# Patient Record
Sex: Female | Born: 1950 | Race: White | Hispanic: No | Marital: Married | Smoking: Never smoker
Health system: Southern US, Community
[De-identification: ages and names within clinical notes are randomized; demographics above are authoritative.]

## PROBLEM LIST (undated history)

## (undated) DIAGNOSIS — N183 Chronic kidney disease, stage 3 (moderate): Secondary | ICD-10-CM

## (undated) DIAGNOSIS — IMO0001 Reserved for inherently not codable concepts without codable children: Secondary | ICD-10-CM

## (undated) DIAGNOSIS — E669 Obesity, unspecified: Secondary | ICD-10-CM

## (undated) DIAGNOSIS — M1711 Unilateral primary osteoarthritis, right knee: Secondary | ICD-10-CM

## (undated) DIAGNOSIS — K219 Gastro-esophageal reflux disease without esophagitis: Secondary | ICD-10-CM

## (undated) DIAGNOSIS — K859 Acute pancreatitis without necrosis or infection, unspecified: Secondary | ICD-10-CM

## (undated) DIAGNOSIS — Z8719 Personal history of other diseases of the digestive system: Secondary | ICD-10-CM

## (undated) DIAGNOSIS — N189 Chronic kidney disease, unspecified: Secondary | ICD-10-CM

## (undated) DIAGNOSIS — E079 Disorder of thyroid, unspecified: Secondary | ICD-10-CM

## (undated) DIAGNOSIS — K635 Polyp of colon: Secondary | ICD-10-CM

## (undated) DIAGNOSIS — M1712 Unilateral primary osteoarthritis, left knee: Secondary | ICD-10-CM

## (undated) DIAGNOSIS — E785 Hyperlipidemia, unspecified: Secondary | ICD-10-CM

## (undated) DIAGNOSIS — I10 Essential (primary) hypertension: Secondary | ICD-10-CM

## (undated) HISTORY — PX: COLONOSCOPY: SHX174

## (undated) HISTORY — DX: Hyperlipidemia, unspecified: E78.5

## (undated) HISTORY — DX: Polyp of colon: K63.5

## (undated) HISTORY — DX: Disorder of thyroid, unspecified: E07.9

## (undated) HISTORY — DX: Gastro-esophageal reflux disease without esophagitis: K21.9

## (undated) HISTORY — DX: Essential (primary) hypertension: I10

## (undated) HISTORY — DX: Obesity, unspecified: E66.9

## (undated) HISTORY — PX: KNEE ARTHROSCOPY W/ MENISCAL REPAIR: SHX1877

## (undated) HISTORY — PX: FOOT SURGERY: SHX648

## (undated) HISTORY — PX: OTHER SURGICAL HISTORY: SHX169

## (undated) HISTORY — DX: Reserved for inherently not codable concepts without codable children: IMO0001

## (undated) HISTORY — PX: LAPAROSCOPIC ENDOMETRIOSIS FULGURATION: SUR769

## (undated) HISTORY — DX: Acute pancreatitis without necrosis or infection, unspecified: K85.90

## (undated) HISTORY — PX: DIAGNOSTIC LAPAROSCOPY: SUR761

## (undated) HISTORY — PX: JOINT REPLACEMENT: SHX530

---

## 2001-06-06 ENCOUNTER — Encounter: Payer: Self-pay | Admitting: Obstetrics and Gynecology

## 2001-06-06 ENCOUNTER — Ambulatory Visit (HOSPITAL_COMMUNITY): Admission: RE | Admit: 2001-06-06 | Discharge: 2001-06-06 | Payer: Self-pay | Admitting: Obstetrics and Gynecology

## 2001-12-20 ENCOUNTER — Encounter: Payer: Self-pay | Admitting: Obstetrics and Gynecology

## 2001-12-20 ENCOUNTER — Ambulatory Visit (HOSPITAL_COMMUNITY): Admission: RE | Admit: 2001-12-20 | Discharge: 2001-12-20 | Payer: Self-pay | Admitting: Obstetrics and Gynecology

## 2002-01-15 ENCOUNTER — Encounter (HOSPITAL_COMMUNITY): Admission: RE | Admit: 2002-01-15 | Discharge: 2002-02-14 | Payer: Self-pay | Admitting: Orthopedic Surgery

## 2002-06-10 ENCOUNTER — Ambulatory Visit (HOSPITAL_COMMUNITY): Admission: RE | Admit: 2002-06-10 | Discharge: 2002-06-10 | Payer: Self-pay | Admitting: Pulmonary Disease

## 2002-07-11 ENCOUNTER — Emergency Department (HOSPITAL_COMMUNITY): Admission: EM | Admit: 2002-07-11 | Discharge: 2002-07-11 | Payer: Self-pay | Admitting: *Deleted

## 2003-06-12 ENCOUNTER — Ambulatory Visit (HOSPITAL_COMMUNITY): Admission: RE | Admit: 2003-06-12 | Discharge: 2003-06-12 | Payer: Self-pay | Admitting: Pulmonary Disease

## 2003-10-22 ENCOUNTER — Ambulatory Visit (HOSPITAL_COMMUNITY): Admission: RE | Admit: 2003-10-22 | Discharge: 2003-10-22 | Payer: Self-pay | Admitting: Podiatry

## 2004-03-04 ENCOUNTER — Ambulatory Visit (HOSPITAL_COMMUNITY): Admission: RE | Admit: 2004-03-04 | Discharge: 2004-03-04 | Payer: Self-pay | Admitting: Podiatry

## 2004-06-16 ENCOUNTER — Ambulatory Visit (HOSPITAL_COMMUNITY): Admission: RE | Admit: 2004-06-16 | Discharge: 2004-06-16 | Payer: Self-pay | Admitting: Pulmonary Disease

## 2005-06-27 ENCOUNTER — Ambulatory Visit (HOSPITAL_COMMUNITY): Admission: RE | Admit: 2005-06-27 | Discharge: 2005-06-27 | Payer: Self-pay | Admitting: Pulmonary Disease

## 2005-12-16 ENCOUNTER — Ambulatory Visit: Payer: Self-pay | Admitting: Internal Medicine

## 2005-12-16 ENCOUNTER — Ambulatory Visit (HOSPITAL_COMMUNITY): Admission: RE | Admit: 2005-12-16 | Discharge: 2005-12-16 | Payer: Self-pay | Admitting: Internal Medicine

## 2005-12-16 ENCOUNTER — Encounter (INDEPENDENT_AMBULATORY_CARE_PROVIDER_SITE_OTHER): Payer: Self-pay | Admitting: Specialist

## 2006-07-24 ENCOUNTER — Ambulatory Visit (HOSPITAL_COMMUNITY): Admission: RE | Admit: 2006-07-24 | Discharge: 2006-07-24 | Payer: Self-pay | Admitting: Pulmonary Disease

## 2007-08-01 ENCOUNTER — Ambulatory Visit (HOSPITAL_COMMUNITY): Admission: RE | Admit: 2007-08-01 | Discharge: 2007-08-01 | Payer: Self-pay | Admitting: Obstetrics and Gynecology

## 2008-08-01 ENCOUNTER — Ambulatory Visit (HOSPITAL_COMMUNITY): Admission: RE | Admit: 2008-08-01 | Discharge: 2008-08-01 | Payer: Self-pay | Admitting: Pulmonary Disease

## 2009-08-06 ENCOUNTER — Ambulatory Visit (HOSPITAL_COMMUNITY): Admission: RE | Admit: 2009-08-06 | Discharge: 2009-08-06 | Payer: Self-pay | Admitting: Pulmonary Disease

## 2010-08-13 ENCOUNTER — Other Ambulatory Visit (HOSPITAL_COMMUNITY): Payer: Self-pay | Admitting: Pulmonary Disease

## 2010-08-13 DIAGNOSIS — Z139 Encounter for screening, unspecified: Secondary | ICD-10-CM

## 2010-08-24 ENCOUNTER — Ambulatory Visit (HOSPITAL_COMMUNITY)
Admission: RE | Admit: 2010-08-24 | Discharge: 2010-08-24 | Disposition: A | Discharge status: Home / Self Care | Payer: BC Managed Care – PPO | Source: Ambulatory Visit | Attending: Pulmonary Disease | Admitting: Pulmonary Disease

## 2010-08-24 DIAGNOSIS — Z1231 Encounter for screening mammogram for malignant neoplasm of breast: Secondary | ICD-10-CM | POA: Insufficient documentation

## 2010-08-24 DIAGNOSIS — Z139 Encounter for screening, unspecified: Secondary | ICD-10-CM

## 2010-09-17 NOTE — Op Note (Signed)
NAMEMARKAYLA, REICHART                 ACCOUNT NO.:  [id number]   MEDICAL RECORD NO.:  [id number]          PATIENT TYPE:  AMB   LOCATION:  DAY                           FACILITY:  [facility]   PHYSICIAN:  Lionel December, M.D.    DATE OF BIRTH:  1951/03/12   DATE OF PROCEDURE:  12/16/2005  DATE OF DISCHARGE:                                 OPERATIVE REPORT   PROCEDURE:  Colonoscopy.   INDICATIONS:  Fumi is a 60 year old Caucasian female who is undergoing  average risk screening colonoscopy.  The procedure was reviewed with the  patient, informed consent was obtained.   MEDS FOR CONSCIOUS SEDATION:  Demerol 50 mg IV, Versed 7 mg IV.   FINDINGS:  The procedure performed in endoscopy suite.  The patient's vital  signs and O2 sat were monitored during the procedure and remained stable.  The patient was placed in the left lateral position.  Rectal examination  performed.  No abnormality noted on external or digital exam.  The Olympus  videoscope was placed in the rectum and advanced under direct vision in the  sigmoid colon and beyond.  Preparation was excellent.  A few tiny  diverticula were noted at the sigmoid colon.  There was a 5 mm polyp at the  mid transverse colon which was ablated via cold biopsy.  Pictures taken for  the record.  The scope was passed to the cecum which was identified by the  appendiceal orifice and ileocecal valve.  Once again pictures taken for the  record.  The colonic mucosa was examined for the second time on the way out  and was normal otherwise.  Rectal mucosa similarly was normal.  The scope  was retroflexed to examine anorectal junction and small hemorrhoids were  noted below the dentate line.  The endoscope was straightened and withdrawn.  The patient tolerated the procedure well.   FINAL DIAGNOSIS:  1. 5 mm polyp ablated via cold biopsy from mid transverse colon.  2. A few tiny diverticula at sigmoid colon.  3. Small external hemorrhoids.    RECOMMENDATIONS:  1. High-fiber diet.  2. I will be contacting the patient with biopsy results and further      recommendations.      Lionel December, M.D.  Electronically Signed     NR/MEDQ  D:  12/16/2005  T:  12/16/2005  Job:  [id number]   cc:   S. Kyra Manges, M.D.  Fax: [phone]   Oneal Deputy. Juanetta Gosling, M.D.  Fax: [phone]

## 2010-09-17 NOTE — H&P (Signed)
Deanna [name], PFARR                 ACCOUNT NO.:  [id number]   MEDICAL RECORD NO.:  [id number]           PATIENT TYPE:   LOCATION:                                 FACILITY:   PHYSICIAN:  Oley Balm. Pricilla Holm, D.P.M.     DATE OF BIRTH:   DATE OF ADMISSION:  03/04/2004  DATE OF DISCHARGE:  LH                                HISTORY & PHYSICAL   HISTORY OF PRESENT ILLNESS:  Ms. Deanna [name] is a 60 year old female that  previously underwent bunion surgery and hammertoe surgery, and subsequently  developed and under-lapping third toe of her left foot.  She relates she  feels like she walks on the third toe. She requests surgical correction of  same.   PREVIOUS HOSPITALIZATION AND SURGERY:  1.  Childbirth.  2.  Endometriosis.  3.  Bilateral bunion surgery.   MEDICATIONS:  Lotrel, Nexium, calcium, vitamin C.   ALLERGIES:  SULFA DRUGS.   PAST MEDICAL HISTORY:  No transfusions or hepatitis.   REVIEW OF SYSTEMS:  History of arthritis, stomach ulcers, hypertension.   PHYSICAL EXAMINATION:  EXTREMITIES:  Palpable pedal pulses both DP and PT  with spontaneous capillary filling time.  NEUROLOGIC:  Exam is essentially within normal limits.  MUSCULOSKELETAL:  Exam reveals the patient has an under-lapping third toe of  the left foot.  Abductor varus rotation.   ASSESSMENT:  Under-lapping third toe left foot.   PLAN:  The patient is to undergo arthroplasty and tenotomy, third toe, left  foot.  Reviewed the procedure, including complications of the procedure such  as infection, wound infection, postoperative pain, swelling, etc.  The  patient seems to understand the same, and surgery has been scheduled for  March 04, 2004.       ___________________________________________  Oley Balm. Pricilla Holm, D.P.M.    DBT/MEDQ  D:  03/03/2004  T:  03/03/2004  Job:  [id number]   cc:   [facility]  Fax: [phone]

## 2010-09-17 NOTE — Op Note (Signed)
[name], [name]                 ACCOUNT NO.:  [id number]   MEDICAL RECORD NO.:  [id number]          PATIENT TYPE:  AMB   LOCATION:  DAY                           FACILITY:  [facility]   PHYSICIAN:  Oley Balm. Pricilla Holm, D.P.M.DATE OF BIRTH:  July 28, 1950   DATE OF PROCEDURE:  03/04/2004  DATE OF DISCHARGE:                                 OPERATIVE REPORT   TYPE OF ANESTHESIA:  Monitored anesthesia care.   PREOPERATIVE DIAGNOSIS:  Abductor varus rotation, third toe left foot, and  dorsal contraction, second left foot.   POSTOPERATIVE DIAGNOSIS:  Abductor varus rotation, third toe left foot, and  dorsal contraction, second left foot.   PROCEDURE:  Tenotomy, arthroplasty, third toe left foot, and tenotomy,  second toe left foot.   INDICATIONS FOR PROCEDURE:  Long-standing history of pain from deformed  third and second toes.   The patient brought to the operating room and placed on the operating table  in supine position. The patient's lower left foot and leg were then prepped  and draped in the usual aseptic manner. Then, with ankle tourniquet placed,  well padded to prevent contusion, and elevated to 250 mmHg after  exsanguination of the left foot, the follow surgical procedures were then  performed under monitored anesthesia care:  1.  TENOTOMY, ARTHROPLASTY, THIRD TOE LEFT FOOT. Attention was turned to the      plantar aspect of the third toe where linear incision made. Incision was      widened and deepened with very sharp blunt dissection, making sure to      identify and retract all vital structures. Capsule incision was made,      and the flexor tendon was isolated and identified and tenotomized      utilizing a #64 blade. After tenotomy, the plantar aspect of the joint      was then invaded. The plantar condyle resected and the wound lavaged      with copious amounts of sterile saline and the cuff and subcutaneous      tissue required 10 sutures of 4-0 Dexon, skin was approximated  utilizing      horizontal mattress suture of 4-0 Prolene.  2.  TENOTOMY, SECOND TOE LEFT FOOT. Attention was directed to the dorsal      aspect of the second digit where a stab incision made and incision was      widened, and then the extensor tendon was isolated and identified and      then tenotomized. The wound was lavaged with copious amounts of sterile      saline, and the skin incision was closed utilizing 1 horizontal mattress      suture of 4-0 Prolene.   All surgical sites were then infiltrated with approximately 18 mL of  dexamethasone phosphate and mild compressive bandage consisting of Betadine  soaked Adaptic, sterile 4 x 4's, and Kling were then applied. The patient  tolerated the procedure well and left the operating room in apparent good  condition. Vital signs were stable in the recovery room.   ADDENDUM:  There should be an addendum  on Deanna [name]'s history and physical.  It should be noted that she had a contracted second toe, left foot, with  diagnosis of hammer toe second digit, left foot. Proposed surgery tenotomy  second toe, left foot.      DBT/MEDQ  D:  03/04/2004  T:  03/04/2004  Job:  [id number]

## 2010-09-17 NOTE — H&P (Signed)
NAME:  Deanna [name], Deanna [name]                           ACCOUNT NO.:  [id number]   MEDICAL RECORD NO.:  [id number]                   PATIENT TYPE:  AMB   LOCATION:  DAY                                  FACILITY:  [facility]   PHYSICIAN:  Oley Balm. Pricilla Holm, D.P.M.             DATE OF BIRTH:  08-Mar-1951   DATE OF ADMISSION:  10/22/2003  DATE OF DISCHARGE:                                HISTORY & PHYSICAL   Ms. Pasquarella is a 60 year old white female who presented to the office with  chief complaint of painful bunion deformity of her left foot.  The patient  has last year undergone correction of her right foot and has now elected to  have her left foot corrected. She relates pain with shoe gear and  ambulation, inability to wear closed shoes from same.  She also relates  difficulty because of contracted second and third toes.  The patient  requests surgical correction of her digital deformities.   PREVIOUS HOSPITALIZATIONS AND SURGERIES:  1. Childbirth.  2. Endometriosis.   MEDICATIONS:  Lotrel, Nexium, calcium, vitamin C.   ALLERGIES:  She relates allergies to SULFA DRUGS.   No transfusion or hepatitis.   REVIEW OF SYSTEMS:  She reveals history of arthritis, stomach ulcers,  hypertension.   PHYSICAL EXAMINATION:  EXTREMITIES:  Lower extremity exam reveals palpable  pedal pulses, but DP and PT revealed spontaneous capillary refilling time.  NEUROLOGIC:  Essentially within normal limits.  MUSCULOSKELETAL:  Reveals hypertrophy of the medial eminence of the first  metatarsal head of the left foot with lateral deviation of the hallux  consistent with hallux valgus deformity.  There is increased intermetatarsal  angle additionally noted radiographically.  There was also noted dorsally  contracted second toe consistent with a hammertoe deformity.  Also, the  patient has a dorsally contracted second toe with dorsal dislocation at the  proximal interphalangeal joint.   LABORATORY AND X-RAY DATA:  X-rays  taken confirm clinical diagnosis with  dorsally contracted second toe and hallux valgus deformity.   IMPRESSION:  Hallux valgus deformity with dorsally contracted second toe.   PLAN:  I reviewed procedures which would consist of an osteal bunionectomy,  arthroplasty IP fusion second toe.  Reviewed the procedure with the patient  including complications of procedure such as infection, bone infection,  postoperative pain and swelling, etc.  The patient seems to understand the  same, and surgery has been scheduled for October 22, 2003.     ___________________________________________                                         Oley Balm. Pricilla Holm, D.P.M.   DBT/MEDQ  D:  10/21/2003  T:  10/21/2003  Job:  [id number]

## 2010-09-17 NOTE — Op Note (Signed)
NAME:  Deanna [name], Deanna [name]                           ACCOUNT NO.:  [id number]   MEDICAL RECORD NO.:  [id number]                   PATIENT TYPE:  AMB   LOCATION:  DAY                                  FACILITY:  [facility]   PHYSICIAN:  Oley Balm. Pricilla Holm, D.P.M.             DATE OF BIRTH:  10-Jun-1950   DATE OF PROCEDURE:  10/22/2003  DATE OF DISCHARGE:                                 OPERATIVE REPORT   SURGEON OF RECORD:  Oley Balm. Pricilla Holm, D.P.M.   TYPE OF ANESTHESIA:  Monitored anesthesia care with local infiltrate of 2%  Xylocaine and 0.5% Marcaine.   PREOPERATIVE DIAGNOSES:  1. Hallux valgus deformity, left foot.  2. Hammertoe deformity, second digit left foot.   POSTOPERATIVE DIAGNOSES:  1. Hallux valgus deformity, left foot.  2. Hammertoe deformity, second digit left foot.   PROCEDURE:  Austin bunionectomy in left foot and arthroplasty at IP fusion,  second toe left foot.   INDICATIONS FOR SURGERY:  Longstanding history of pain, unrelieved by  conservative care.   DESCRIPTION OF PROCEDURE:  The patient brought in the operating room and  placed on the operating table in the supine position.  The patient's lower  foot and leg was then prepped and draped in the usual aseptic manner.  Then  with an ankle tourniquet placed and well-padded to prevent contusion and  elevated 250 mmHg, after exsanguination of the left foot, the following  surgical procedures were then performed under monitored anesthesia care.   AUSTIN BUNIONECTOMY, LEFT FOOT:  Attention was directed to the dorsomedial  aspect of the left foot at the level of the first MTP where a curvilinear  incision was made.  Incision was widened and deepened via sharp and blunt  dissection being sure to identify and retract all vital structures.  A  capsular incision was then made in the head of the metatarsal, freeing all  soft tissue attachments dorsally and medially.  Then utilizing a Zimmer  oscillating saw, the medial limbus on the  medial aspect of the first  metatarsal head was resected.  Dissection was then carried down deep in the  first web space where a lateral fibula sesamoid release was performed.  Attention was redirected to the medial aspect of the first metatarsal head  where using a guidewire, an Austin-type osteotomy was made over the apex  distal and the base proximal.  The capital fragments were slid laterally,  impacted, and fixated with two 0.045 K-wires.  After fixation, it was noted  that the osteotomy site was stable.  The remaining protruding aspect of the  metatarsal was resected.  All rough edges were rasped smooth, the wound  lavaged with copious amounts of sterile saline, and the subcutaneous tissue  approximated with subcuticular suture of 4-0 Dexon, and the skin was  approximated utilizing running subcuticular suture of 4-0 Dexon.   ARTHROPLASTY SECOND TOE LEFT FOOT WITH K-WIRE STABILIZATION:  Attention was  directed to the dorsal aspect of the second toe where two semi-elliptical  converting transverse skin incisions were made, the incision widened and  deepened via sharp and blunt dissection, being sure to remove the  intervening flap of skin.  A transverse capsular incision was made and the  head of the phalanx freed from its soft tissue attachments dorsally,  medially, laterally, and plantarly, and then the head of the phalanx  resected utilizing a Zimmer oscillating saw.  At the end, a K-wire was  retrograded through the middle phalanx to the proximal phalanx to stabilize  the toe.  Once the stabilization was obtained, the tendon was reapproximated  utilizing 1 horizontal mattress suture of 4-0 Dexon.  The skin was  approximated utilizing horizontal mattress sutures of 4-0 Prolene.   All surgical sites were then infiltrated with approximately 18 mL of  dexamethasone phosphatase and mild compressive bandages consisting of  Betadine-soaked Adaptic.  Sterile 4 x 4's, and sterile Kling  were then  applied.  The patient tolerated the procedure well and left the operating  room in apparent good condition with vital signs stable to recovery room.      ___________________________________________                                            Oley Balm Pricilla Holm, D.P.M.   DBT/MEDQ  D:  10/22/2003  T:  10/22/2003  Job:  [id number]

## 2010-10-30 ENCOUNTER — Emergency Department (HOSPITAL_COMMUNITY)
Admission: EM | Admit: 2010-10-30 | Discharge: 2010-10-30 | Disposition: A | Discharge status: Home / Self Care | Payer: BC Managed Care – PPO | Attending: Emergency Medicine | Admitting: Emergency Medicine

## 2010-10-30 ENCOUNTER — Emergency Department (HOSPITAL_COMMUNITY): Payer: BC Managed Care – PPO

## 2010-10-30 DIAGNOSIS — R112 Nausea with vomiting, unspecified: Secondary | ICD-10-CM | POA: Insufficient documentation

## 2010-10-30 DIAGNOSIS — K219 Gastro-esophageal reflux disease without esophagitis: Secondary | ICD-10-CM | POA: Insufficient documentation

## 2010-10-30 DIAGNOSIS — I10 Essential (primary) hypertension: Secondary | ICD-10-CM | POA: Insufficient documentation

## 2010-10-30 DIAGNOSIS — K859 Acute pancreatitis without necrosis or infection, unspecified: Secondary | ICD-10-CM | POA: Insufficient documentation

## 2010-10-30 DIAGNOSIS — R1084 Generalized abdominal pain: Secondary | ICD-10-CM | POA: Insufficient documentation

## 2010-10-30 DIAGNOSIS — Z79899 Other long term (current) drug therapy: Secondary | ICD-10-CM | POA: Insufficient documentation

## 2010-10-30 DIAGNOSIS — K5900 Constipation, unspecified: Secondary | ICD-10-CM | POA: Insufficient documentation

## 2010-10-30 LAB — DIFFERENTIAL
Basophils Absolute: 0 10*3/uL (ref 0.0–0.1)
Eosinophils Absolute: 0.6 10*3/uL (ref 0.0–0.7)
Eosinophils Relative: 4 % (ref 0–5)
Lymphs Abs: 1.6 10*3/uL (ref 0.7–4.0)
Monocytes Absolute: 1.2 10*3/uL — ABNORMAL HIGH (ref 0.1–1.0)
Monocytes Relative: 8 % (ref 3–12)
Neutro Abs: 12.4 10*3/uL — ABNORMAL HIGH (ref 1.7–7.7)

## 2010-10-30 LAB — COMPREHENSIVE METABOLIC PANEL
ALT: 26 U/L (ref 0–35)
Alkaline Phosphatase: 116 U/L (ref 39–117)
Calcium: 10.2 mg/dL (ref 8.4–10.5)
GFR calc non Af Amer: 54 mL/min — ABNORMAL LOW (ref >60)
Glucose, Bld: 148 mg/dL — ABNORMAL HIGH (ref 70–99)
Sodium: 135 mEq/L (ref 135–145)

## 2010-10-30 LAB — URINALYSIS, ROUTINE W REFLEX MICROSCOPIC
Glucose, UA: NEGATIVE mg/dL
Leukocytes, UA: NEGATIVE
Protein, ur: NEGATIVE mg/dL
Urobilinogen, UA: 0.2 mg/dL (ref 0.0–1.0)

## 2010-10-30 LAB — CBC
HCT: 37.5 % (ref 36.0–46.0)
MCH: 30.9 pg (ref 26.0–34.0)

## 2010-10-30 MED ORDER — IOHEXOL 300 MG/ML  SOLN
100.0000 mL | Freq: Once | INTRAMUSCULAR | Status: AC | PRN
  Administered 2010-10-30: 100 mL via INTRAVENOUS

## 2010-11-02 ENCOUNTER — Other Ambulatory Visit (HOSPITAL_COMMUNITY): Payer: Self-pay | Admitting: Pulmonary Disease

## 2010-11-02 DIAGNOSIS — K859 Acute pancreatitis without necrosis or infection, unspecified: Secondary | ICD-10-CM

## 2010-11-08 ENCOUNTER — Other Ambulatory Visit (HOSPITAL_COMMUNITY): Payer: Self-pay | Admitting: Pulmonary Disease

## 2010-11-08 ENCOUNTER — Ambulatory Visit (HOSPITAL_COMMUNITY)
Admission: RE | Admit: 2010-11-08 | Discharge: 2010-11-08 | Disposition: A | Discharge status: Home / Self Care | Payer: BC Managed Care – PPO | Source: Ambulatory Visit | Attending: Pulmonary Disease | Admitting: Pulmonary Disease

## 2010-11-08 DIAGNOSIS — Z09 Encounter for follow-up examination after completed treatment for conditions other than malignant neoplasm: Secondary | ICD-10-CM | POA: Insufficient documentation

## 2010-11-08 DIAGNOSIS — K859 Acute pancreatitis without necrosis or infection, unspecified: Secondary | ICD-10-CM

## 2010-11-08 DIAGNOSIS — K861 Other chronic pancreatitis: Secondary | ICD-10-CM | POA: Insufficient documentation

## 2010-11-08 DIAGNOSIS — R109 Unspecified abdominal pain: Secondary | ICD-10-CM | POA: Insufficient documentation

## 2010-11-08 DIAGNOSIS — R110 Nausea: Secondary | ICD-10-CM | POA: Insufficient documentation

## 2010-11-08 MED ORDER — IOHEXOL 300 MG/ML  SOLN
100.0000 mL | Freq: Once | INTRAMUSCULAR | Status: AC | PRN
  Administered 2010-11-08: 100 mL via INTRAVENOUS

## 2010-11-10 ENCOUNTER — Ambulatory Visit (INDEPENDENT_AMBULATORY_CARE_PROVIDER_SITE_OTHER): Payer: BC Managed Care – PPO | Admitting: Internal Medicine

## 2010-11-10 DIAGNOSIS — K859 Acute pancreatitis without necrosis or infection, unspecified: Secondary | ICD-10-CM

## 2010-12-07 ENCOUNTER — Other Ambulatory Visit (INDEPENDENT_AMBULATORY_CARE_PROVIDER_SITE_OTHER): Payer: Self-pay | Admitting: Internal Medicine

## 2010-12-07 DIAGNOSIS — K859 Acute pancreatitis without necrosis or infection, unspecified: Secondary | ICD-10-CM

## 2010-12-10 ENCOUNTER — Ambulatory Visit (HOSPITAL_COMMUNITY)
Admission: RE | Admit: 2010-12-10 | Discharge: 2010-12-10 | Disposition: A | Discharge status: Home / Self Care | Payer: BC Managed Care – PPO | Source: Ambulatory Visit | Attending: Internal Medicine | Admitting: Internal Medicine

## 2010-12-10 DIAGNOSIS — K859 Acute pancreatitis without necrosis or infection, unspecified: Secondary | ICD-10-CM | POA: Insufficient documentation

## 2010-12-10 DIAGNOSIS — K769 Liver disease, unspecified: Secondary | ICD-10-CM | POA: Insufficient documentation

## 2010-12-10 DIAGNOSIS — K5730 Diverticulosis of large intestine without perforation or abscess without bleeding: Secondary | ICD-10-CM | POA: Insufficient documentation

## 2010-12-10 DIAGNOSIS — Z09 Encounter for follow-up examination after completed treatment for conditions other than malignant neoplasm: Secondary | ICD-10-CM | POA: Insufficient documentation

## 2010-12-10 DIAGNOSIS — K449 Diaphragmatic hernia without obstruction or gangrene: Secondary | ICD-10-CM | POA: Insufficient documentation

## 2010-12-10 MED ORDER — IOHEXOL 300 MG/ML  SOLN
100.0000 mL | Freq: Once | INTRAMUSCULAR | Status: AC | PRN
  Administered 2010-12-10: 100 mL via INTRAVENOUS

## 2010-12-14 NOTE — Progress Notes (Signed)
SET RECALL 6 MTHS KR

## 2011-06-01 ENCOUNTER — Encounter (INDEPENDENT_AMBULATORY_CARE_PROVIDER_SITE_OTHER): Payer: Self-pay | Admitting: *Deleted

## 2011-06-14 ENCOUNTER — Ambulatory Visit (INDEPENDENT_AMBULATORY_CARE_PROVIDER_SITE_OTHER): Payer: BC Managed Care – PPO | Admitting: Internal Medicine

## 2011-09-06 ENCOUNTER — Other Ambulatory Visit (HOSPITAL_COMMUNITY): Payer: Self-pay | Admitting: Pulmonary Disease

## 2011-09-06 DIAGNOSIS — Z139 Encounter for screening, unspecified: Secondary | ICD-10-CM

## 2011-09-13 ENCOUNTER — Ambulatory Visit (HOSPITAL_COMMUNITY): Payer: BC Managed Care – PPO

## 2011-09-15 ENCOUNTER — Ambulatory Visit (HOSPITAL_COMMUNITY)
Admission: RE | Admit: 2011-09-15 | Discharge: 2011-09-15 | Disposition: A | Discharge status: Home / Self Care | Payer: BC Managed Care – PPO | Source: Ambulatory Visit | Attending: Pulmonary Disease | Admitting: Pulmonary Disease

## 2011-09-15 DIAGNOSIS — Z1231 Encounter for screening mammogram for malignant neoplasm of breast: Secondary | ICD-10-CM | POA: Insufficient documentation

## 2011-09-15 DIAGNOSIS — Z139 Encounter for screening, unspecified: Secondary | ICD-10-CM

## 2012-02-01 ENCOUNTER — Encounter: Payer: Self-pay | Admitting: Orthopedic Surgery

## 2012-02-01 ENCOUNTER — Ambulatory Visit (INDEPENDENT_AMBULATORY_CARE_PROVIDER_SITE_OTHER): Payer: BC Managed Care – PPO | Admitting: Orthopedic Surgery

## 2012-02-01 VITALS — BP 160/80 | Ht 67.0 in | Wt 265.0 lb

## 2012-02-01 DIAGNOSIS — M6530 Trigger finger, unspecified finger: Secondary | ICD-10-CM | POA: Insufficient documentation

## 2012-02-01 NOTE — Patient Instructions (Addendum)
You have received a steroid shot. 15% of patients experience increased pain at the injection site with in the next 24 hours. This is best treated with ice and tylenol extra strength 2 tabs every 8 hours. If you are still having pain please call the office.   Trigger Finger Trigger finger (digital tendinitis and stenosing tenosynovitis) is a common disorder that causes an often painful catching of the fingers or thumb. It occurs as a clicking, snapping or locking of a finger in the palm of the hand. The reason for this is that there is a problem with the tendons which flex the fingers sliding smoothly through their sheaths. The cause of this may be inflammation of the tendon and sheath, or from a thickening or nodule in the tendon. The condition may occur in any finger or a couple fingers at the same time. The cause may be overuse while doing the same activity over and over again with your hands.   Tendons are the tough cords that connect the muscles to bones. Muscles and tendons are part of the system which allows your body to move. When muscles contract in the forearm on the palm side, they pull the tendons toward the elbow and cause the fingers and thumb to bend (flex) toward the palm. These are the flexor tendons. The tendons slide through a slippery smooth membrane (synovium) which is called the tendon sheath. The sheaths have areas of tough fibrous tissues surrounding them which hold the tendons close to the bone. These are called pulleys because they work like a pulley. The first pulley is in the palm of the hand near the crease which runs across your palm. If the area of the tendon thickening is near the pulley, the tendon cannot slide smoothly through the pulley and this causes the trigger finger. The finger may lock with the finger curled or suddenly straighten out with a snap. This is more common in patients with rheumatoid arthritis and diabetes. Left untreated, the condition may get worse to the  point where the finger becomes locked in flexion, like making a fist, or less commonly locked with the finger straightened out. DIAGNOSIS   Your caregiver will easily make this diagnosis on examination. TREATMENT    Splinting for 6 to 8 weeks of time may be helpful. Use the splints as your caregiver suggests.   Heat used for twenty minutes at least four times a day followed by ice packs for twenty minutes unless directed otherwise by your caregiver may be helpful. If you find either heat or cold seems to be making the problem worse, quit using them and ask your caregiver for directions.   Cortisone injections along with splinting may speed up recovery. Several injections may be required. Cortisone may give relief after one injection.   Only take over-the-counter or prescription medicines for pain, discomfort, or fever as directed by your caregiver.   Surgery is another treatment that may be used if conservative treatments using injection and splinting does not work. Surgery can be minor without incisions (a cut does not have to be made) and can be done with a needle through the skin. No stitches are needed and most patients may return to work the same day.   Other surgical choices involve an open procedure where the surgeon opens the hand through a small incision (cut) and cuts the pulley so the tendon can again slide smoothly. Your hand will still work fine. This small operation requires stitches and the recovery will   be a little longer and the incisions will need to be protected until completely healed. You may have to limit your activities for up to 6 months.   Occupational or hand therapy may be required if there is stiffness remaining in the finger.  RISKS AND COMPLICATIONS Complications are uncommon but some problems that may occur are:  Recurrence of the trigger finger. This does not mean that the surgery was not well done. It simply means that you may have formed scar tissue following  surgery that causes the problem to reoccur.   Infection which could ruin the results of the surgery and can result in a finger which is frozen and can not move normally.   Nerve injury is possible which could result in permanent numbness of one or more fingers.  CARE AFTER SURGERY  Elevate your hand above your heart and use ice as instructed.   Follow instructions regarding finger motion/exercise.   Keep the surgical wound dry for at least 48 hrs or longer if instructed.   Keep your follow-up appointments.   Return to work and normal activities as instructed.  SEEK IMMEDIATE MEDICAL CARE IF:   Your problems are getting worse or you do not obtain relief from the treatment. Document Released: 02/06/2004 Document Revised: 07/11/2011 Document Reviewed: 09/30/2008 [employer] Patient Information 2013 [employer].    

## 2012-02-01 NOTE — Progress Notes (Signed)
Patient ID: Deanna [name], female   DOB: [birth year], 61 y.o.   MRN: [id number] Chief Complaint  Patient presents with  . Hand Problem    trigger finger, right thumb, sudden onset x 1 week ago    61 years presents with triggering of the RIGHT thumb x1 week. She did try splinting. Could not tolerate it secondary to the amount of typing. She has to deal. The symptoms came on suddenly. She has no pain over the A1 pulley of the RIGHT thumb with clicking and popping at the IP joint with 2/10 pain and a catching sensation.  Review of systems heartburn bruises easily otherwise normal.  The patient's allergies are recorded, the medical and surgical history have been recorded, medications family history and social history have been recorded and all have been reviewed.  Past Medical History  Diagnosis Date  . HTN (hypertension)   . Reflux     BP 160/80  Ht 5\' 7"  (1.702 m)  Wt 265 lb (120.203 kg)  BMI 41.50 kg/m2 Normal. The grip filament, grooming, and hygiene.  Awake, alert, and oriented x3  Mood and affect are normal.  Normal color, capillary refill, and vascularity to the RIGHT hand  No deformities, tenderness over the A1 pulley. Active flexion, extension of the IP joint is normal except for the clicking and popping and jumping of the joint. Extension and flexion power are normal.  No joint instability is detected. Normal sensation as noted in the digit.  Diagnosis of trigger thumb  Recommended injection  Procedure note trigger finger injection  Diagnosis trigger finger, RIGHT thumb Postop diagnosis trigger finger, RIGHT thumb Procedure injection of trigger finger, RIGHT thumb Finger injected RIGHT thumb Details of procedure: After verbal consent and timeout to confirm site the right thumb  was injected with 1 cc of 40 mg of Depo-Medrol and 1 cc of 1% lidocaine  The procedure was tolerated well without complication

## 2012-09-04 ENCOUNTER — Other Ambulatory Visit (HOSPITAL_COMMUNITY): Payer: Self-pay | Admitting: Pulmonary Disease

## 2012-09-04 DIAGNOSIS — Z139 Encounter for screening, unspecified: Secondary | ICD-10-CM

## 2012-09-17 ENCOUNTER — Ambulatory Visit (HOSPITAL_COMMUNITY)
Admission: RE | Admit: 2012-09-17 | Discharge: 2012-09-17 | Disposition: A | Discharge status: Home / Self Care | Payer: BC Managed Care – PPO | Source: Ambulatory Visit | Attending: Pulmonary Disease | Admitting: Pulmonary Disease

## 2012-09-17 DIAGNOSIS — Z1231 Encounter for screening mammogram for malignant neoplasm of breast: Secondary | ICD-10-CM | POA: Insufficient documentation

## 2012-09-17 DIAGNOSIS — Z139 Encounter for screening, unspecified: Secondary | ICD-10-CM

## 2012-12-05 ENCOUNTER — Encounter (INDEPENDENT_AMBULATORY_CARE_PROVIDER_SITE_OTHER): Payer: Self-pay | Admitting: *Deleted

## 2013-05-02 DIAGNOSIS — Z8719 Personal history of other diseases of the digestive system: Secondary | ICD-10-CM

## 2013-05-02 HISTORY — DX: Personal history of other diseases of the digestive system: Z87.19

## 2013-06-25 ENCOUNTER — Ambulatory Visit (INDEPENDENT_AMBULATORY_CARE_PROVIDER_SITE_OTHER): Payer: BC Managed Care – PPO | Admitting: Internal Medicine

## 2013-06-25 ENCOUNTER — Ambulatory Visit (HOSPITAL_COMMUNITY)
Admission: RE | Admit: 2013-06-25 | Discharge: 2013-06-25 | Disposition: A | Discharge status: Home / Self Care | Payer: BC Managed Care – PPO | Source: Ambulatory Visit | Attending: Internal Medicine | Admitting: Internal Medicine

## 2013-06-25 ENCOUNTER — Encounter (INDEPENDENT_AMBULATORY_CARE_PROVIDER_SITE_OTHER): Payer: Self-pay | Admitting: Internal Medicine

## 2013-06-25 ENCOUNTER — Inpatient Hospital Stay (HOSPITAL_COMMUNITY)
Admission: AD | Admit: 2013-06-25 | Discharge: 2013-07-01 | DRG: 439 | Disposition: A | Discharge status: Home / Self Care | Payer: BC Managed Care – PPO | Source: Ambulatory Visit | Attending: Pulmonary Disease | Admitting: Pulmonary Disease

## 2013-06-25 VITALS — BP 118/72 | HR 80 | Temp 98.1°F | Ht 67.0 in | Wt 262.9 lb

## 2013-06-25 DIAGNOSIS — N179 Acute kidney failure, unspecified: Secondary | ICD-10-CM | POA: Diagnosis not present

## 2013-06-25 DIAGNOSIS — E669 Obesity, unspecified: Secondary | ICD-10-CM | POA: Diagnosis present

## 2013-06-25 DIAGNOSIS — R109 Unspecified abdominal pain: Secondary | ICD-10-CM

## 2013-06-25 DIAGNOSIS — E876 Hypokalemia: Secondary | ICD-10-CM | POA: Diagnosis not present

## 2013-06-25 DIAGNOSIS — K859 Acute pancreatitis without necrosis or infection, unspecified: Principal | ICD-10-CM | POA: Diagnosis present

## 2013-06-25 DIAGNOSIS — Z6841 Body Mass Index (BMI) 40.0 and over, adult: Secondary | ICD-10-CM

## 2013-06-25 DIAGNOSIS — Z833 Family history of diabetes mellitus: Secondary | ICD-10-CM

## 2013-06-25 DIAGNOSIS — K869 Disease of pancreas, unspecified: Secondary | ICD-10-CM | POA: Insufficient documentation

## 2013-06-25 DIAGNOSIS — K219 Gastro-esophageal reflux disease without esophagitis: Secondary | ICD-10-CM | POA: Diagnosis present

## 2013-06-25 DIAGNOSIS — I10 Essential (primary) hypertension: Secondary | ICD-10-CM | POA: Diagnosis present

## 2013-06-25 DIAGNOSIS — D649 Anemia, unspecified: Secondary | ICD-10-CM | POA: Diagnosis present

## 2013-06-25 DIAGNOSIS — K7689 Other specified diseases of liver: Secondary | ICD-10-CM | POA: Diagnosis present

## 2013-06-25 LAB — CBC
HEMATOCRIT: 41.7 % (ref 36.0–46.0)
Hemoglobin: 14.1 g/dL (ref 12.0–15.0)
MCH: 31.7 pg (ref 26.0–34.0)
MCHC: 33.8 g/dL (ref 30.0–36.0)
MCV: 93.7 fL (ref 78.0–100.0)
Platelets: 450 10*3/uL — ABNORMAL HIGH (ref 150–400)
RBC: 4.45 MIL/uL (ref 3.87–5.11)
RDW: 13.5 % (ref 11.5–15.5)
WBC: 11.5 10*3/uL — ABNORMAL HIGH (ref 4.0–10.5)

## 2013-06-25 LAB — COMPREHENSIVE METABOLIC PANEL
ALK PHOS: 115 U/L (ref 39–117)
ALT: 25 U/L (ref 0–35)
AST: 24 U/L (ref 0–37)
Albumin: 3.9 g/dL (ref 3.5–5.2)
BUN: 12 mg/dL (ref 6–23)
CALCIUM: 9.8 mg/dL (ref 8.4–10.5)
CO2: 29 mEq/L (ref 19–32)
CREATININE: 0.92 mg/dL (ref 0.50–1.10)
Chloride: 100 mEq/L (ref 96–112)
Glucose, Bld: 94 mg/dL (ref 70–99)
Potassium: 5 mEq/L (ref 3.5–5.3)
Sodium: 142 mEq/L (ref 135–145)
Total Bilirubin: 0.4 mg/dL (ref 0.3–1.2)
Total Protein: 8 g/dL (ref 6.0–8.3)

## 2013-06-25 LAB — AMYLASE: Amylase: 164 U/L — ABNORMAL HIGH (ref 0–105)

## 2013-06-25 LAB — TRIGLYCERIDES: TRIGLYCERIDES: 132 mg/dL (ref <150)

## 2013-06-25 LAB — LIPASE: Lipase: 471 U/L — ABNORMAL HIGH (ref 11–59)

## 2013-06-25 MED ORDER — IOHEXOL 300 MG/ML  SOLN
100.0000 mL | Freq: Once | INTRAMUSCULAR | Status: AC | PRN
  Administered 2013-06-25: 100 mL via INTRAVENOUS

## 2013-06-25 MED ORDER — ENOXAPARIN SODIUM 40 MG/0.4ML ~~LOC~~ SOLN
40.0000 mg | SUBCUTANEOUS | Status: DC
  Administered 2013-06-25 – 2013-06-30 (×6): 40 mg via SUBCUTANEOUS
  Filled 2013-06-25 (×6): qty 0.4

## 2013-06-25 MED ORDER — ONDANSETRON HCL 4 MG PO TABS: 4.0000 mg | ORAL_TABLET | Freq: Three times a day (TID) | ORAL | Status: DC | PRN

## 2013-06-25 MED ORDER — HYDROCODONE-ACETAMINOPHEN 5-300 MG PO TABS: 1.0000 | ORAL_TABLET | Freq: Four times a day (QID) | ORAL | Status: DC | PRN

## 2013-06-25 MED ORDER — AMLODIPINE BESYLATE 5 MG PO TABS
5.0000 mg | ORAL_TABLET | Freq: Every day | ORAL | Status: DC
  Administered 2013-06-26 – 2013-07-01 (×6): 5 mg via ORAL
  Filled 2013-06-25 (×6): qty 1

## 2013-06-25 MED ORDER — BENAZEPRIL HCL 10 MG PO TABS
20.0000 mg | ORAL_TABLET | Freq: Every day | ORAL | Status: DC
  Administered 2013-06-26 – 2013-06-27 (×2): 20 mg via ORAL
  Filled 2013-06-25 (×2): qty 2

## 2013-06-25 MED ORDER — ONDANSETRON HCL 4 MG/2ML IJ SOLN
4.0000 mg | Freq: Four times a day (QID) | INTRAMUSCULAR | Status: DC | PRN
  Administered 2013-06-25 – 2013-06-26 (×3): 4 mg via INTRAVENOUS
  Filled 2013-06-25 (×3): qty 2

## 2013-06-25 MED ORDER — PANTOPRAZOLE SODIUM 40 MG IV SOLR
40.0000 mg | INTRAVENOUS | Status: DC
  Administered 2013-06-25: 40 mg via INTRAVENOUS
  Filled 2013-06-25: qty 40

## 2013-06-25 MED ORDER — AMLODIPINE BESY-BENAZEPRIL HCL 5-20 MG PO CAPS: 1.0000 | ORAL_CAPSULE | Freq: Every day | ORAL | Status: DC

## 2013-06-25 MED ORDER — HYDROMORPHONE HCL PF 1 MG/ML IJ SOLN
1.0000 mg | INTRAMUSCULAR | Status: DC | PRN
  Administered 2013-06-25 – 2013-06-26 (×3): 1 mg via INTRAVENOUS
  Filled 2013-06-25 (×3): qty 1

## 2013-06-25 MED ORDER — KCL IN DEXTROSE-NACL 20-5-0.9 MEQ/L-%-% IV SOLN
INTRAVENOUS | Status: DC
  Administered 2013-06-25 – 2013-06-27 (×5): via INTRAVENOUS

## 2013-06-25 MED ORDER — ONDANSETRON HCL 4 MG PO TABS: 4.0000 mg | ORAL_TABLET | Freq: Four times a day (QID) | ORAL | Status: DC | PRN

## 2013-06-25 NOTE — Patient Instructions (Signed)
Clear liquids as discussed. Notify if you have fever > 101 or worsening abdominal pain. Physician will contact you with results of blood work and CAT scan

## 2013-06-25 NOTE — Progress Notes (Signed)
Presenting complaint;  Upper abdominal pain. Patient concerned that she has acute pancreatitis.  Subjective:  Patient is 63 year old Caucasian female patient of Dr. Luan Pulling who who presents with eight day history of pain across her upper abdomen. She last experienced acute pancreatitis in June 2012. She was followed up in the office in July 2012 and had followup CT in August 2012 and no abnormality was noted. Pancreatitis was presumed to be secondary to [employer]. Patient was in usual state of health until about 3 weeks ago when she developed cold. She was seen by Dr. Luan Pulling and given Z-Pak and Mucinex. She did not take NSAIDs. She did not experience fever. As her cold symptoms improved she developed pain across her upper abdomen last week, pain similar to the one she had when she was diagnosed with pancreatitis. She thought she was going better with clear liquids. Last evening she ate tacos and believes she ate too much. She noted relapse of her pain associated with nausea but no vomiting fever or chills shortness of breath chest pain or diarrhea. She has noted worsening heartburn since this pain started. Her appetite has been normal until 3 weeks ago. She may have lost a few pounds recently. She denies melena or rectal bleeding dysuria or hematuria. Patient does not drink alcohol. Family history is negative for pancreatitis.  Current Medications: Current Outpatient Prescriptions  Medication Sig Dispense Refill  . amLODipine-benazepril (LOTREL) 5-20 MG per capsule Take 1 capsule by mouth daily.      Marland Kitchen aspirin 81 MG tablet Take 81 mg by mouth every other day.       . Coenzyme Q10 (CO Q 10 PO) Take by mouth daily.       Marland Kitchen esomeprazole (NEXIUM) 40 MG capsule Take 40 mg by mouth daily before breakfast.      . fexofenadine (ALLEGRA) 180 MG tablet Take by mouth daily.      . Glucosamine-Chondroitin (COSAMIN DS PO) Take by mouth.      . Multiple Vitamin (MULTIVITAMIN) tablet Take 1 tablet by mouth  daily.       No current facility-administered medications for this visit.   Past medical history; Hypertension. GERD.  Obesity. History of endometriosis. Left knee arthroscopy. Bilateral bunionectomy. Patient underwent screening colonoscopy in August 2007 with removal of small polyp from transverse colon which was tubular adenoma. She had few diverticula sigmoid colon and external hemorrhoids.  Objective: Blood pressure 118/72, pulse 80, temperature 98.1 F (36.7 C), height 5\' 7"  (1.702 m), weight 262 lb 14.4 oz (119.251 kg). Patient is alert and does not appear to be acutely ill. Conjunctiva is pink. Sclera is nonicteric Oropharyngeal mucosa is normal. No neck masses or thyromegaly noted. Cardiac exam with regular rhythm normal S1 and S2. No murmur or gallop noted. Lungs are clear to auscultation. Abdomen full. Bowel sounds are normal. On palpation abdomen is soft with mild tenderness across upper abdomen but primarily in the epigastric region. No organomegaly or masses. No LE edema or clubbing noted.  Assessment:  Patient's acute symptoms are suspicious of acute pancreatitis. She had first episode in June 2012 port to be secondary to NSAID use. She does not take NSAIDs but she did experience symptoms of cold for 2 weeks prior to onset of abdominal pain. She was treated with Z-Pak which has been associated with pancreatitis but incidence very low. Need to be looking for other causes of pancreatitis.   Plan:  Clear liquids. Ondansetron 4 mg 3 times a day when necessary. Vicodin  5/300 by mouth every 6 when necessary. CBC, comprehensive chemistry panel, serum amylase and lipase. Abdominal pelvic CT with contrast. Patient advised to call or go to emergency room if pain gets worse or she develops temp > 101F.

## 2013-06-25 NOTE — H&P (Signed)
Primary Care Physician:  Alonza Bogus, MD Primary Gastroenterologist:  Dr. Laural Golden Reason for admission. Acute pancreatitis.  HPI: Patient is 63 year old Caucasian female who was seen in the office earlier today with a day history of upper abdominal pain. She has history of pancreatitis dating back to June 2012 with full recovery. She was felt to have acute pancreatitis. She was experiencing some nausea denied vomiting fever chills chest pain shortness of breath diarrhea melena or rectal bleeding. There is no history of peptic ulcer disease. She was treated with Z-Pak and Mucinex recently for symptoms of cold. Patient does not drink alcohol. Patient was given prescription for one and Septra and Vicodin and advised to stay on clear liquids. Lab studies were obtained revealing mild leukocytosis elevated amylase and lipase an abdominopelvic CT showed changes of acute pancreatitis. When I contacted patient and her with results of these studies she reported worsening pain not relieved with pain medication. She had taken 2 doses. It was therefore decided to admit patient for further management.  Past Medical History  Diagnosis Date  . HTN (hypertension)   . Reflux     Acute pancreatitis in June 2012 felt to be secondary to etodolac. Obesity.  Past Surgical History  Procedure Laterality Date  . Foot surgery    . Laparoscopic endometriosis fulguration    . Trigger thumb      rt thumb last year    Prior to Admission medications   Medication Sig Start Date End Date Taking? Authorizing Provider  amLODipine-benazepril (LOTREL) 5-20 MG per capsule Take 1 capsule by mouth daily.    Historical Provider, MD  aspirin 81 MG tablet Take 81 mg by mouth every other day.     Historical Provider, MD  Coenzyme Q10 (CO Q 10 PO) Take by mouth daily.     Historical Provider, MD  esomeprazole (NEXIUM) 40 MG capsule Take 40 mg by mouth daily before breakfast.    Historical Provider, MD  Glucosamine-Chondroitin  (COSAMIN DS PO) Take by mouth.    Historical Provider, MD  Hydrocodone-Acetaminophen (VICODIN) 5-300 MG TABS Take 1 tablet by mouth 4 (four) times daily as needed. 06/25/13   Rogene Houston, MD  Multiple Vitamin (MULTIVITAMIN) tablet Take 1 tablet by mouth daily.    Historical Provider, MD  ondansetron (ZOFRAN) 4 MG tablet Take 1 tablet (4 mg total) by mouth every 8 (eight) hours as needed for nausea or vomiting. 06/25/13   Rogene Houston, MD    Allergies  Allergen Reactions  . Sulfa Antibiotics     Family History  Problem Relation Age of Onset  . Diabetes      History   Social History  . Marital Status: Married    Spouse Name: N/A    Number of Children: N/A  . Years of Education: 16   Occupational History  . Not on file.   Social History Main Topics  . Smoking status: Never Smoker   . Smokeless tobacco: Not on file  . Alcohol Use: No  . Drug Use: No  . Sexual Activity: Not on file   Other Topics Concern  . Not on file   Social History Narrative  . No narrative on file    Review of Systems: See HPI, otherwise negative ROS  Physical Exam: There were no vitals taken for this visit. General:   Alert,  Well-developed, well-nourished, pleasant and cooperative in NAD Head:  Normocephalic and atraumatic. Eyes:  Sclera clear, no icterus.   Conjunctiva pink. Mouth:  Oropharyngeal mucosa is normal.  Neck:  Supple; no masses or thyromegaly. Lungs:  Clear throughout to auscultation.   No wheezes, crackles, or rhonchi. No acute distress. Heart:  Regular rate and rhythm; no murmurs, clicks, rubs,  or gallops. Abdomen: Abdomen is full. Bowel sounds are normal. Abdomen is soft with mild tenderness across upper abdomen. No organomegaly or masses.   Rectal:  Deferred until time of colonoscopy.   Extremities:  Without clubbing or edema. Neurologic:  Alert and  oriented x4;  grossly normal neurologically. Skin:  Intact without significant lesions or rashes.  Impression/Plan:     Acute pancreatitis etiology unknown. Symptoms started last week. Pain not controlled with oral medications. Patient is therefore being admitted for IV fluids, IV analgesia and she will need to be n.p.o. except for by mouth medications. Will schedule upper abdominal ultrasound looking for cholelithiasis but I doubt that she has been re\re pancreatitis given transaminases are normal. Would check serum triglycerides IgG 4 level. Patient will be transferred to Dr. Luan Pulling care was patient's primary care physician now and continue to assist in further workup of acute pancreatitis.

## 2013-06-26 ENCOUNTER — Inpatient Hospital Stay (HOSPITAL_COMMUNITY): Payer: BC Managed Care – PPO

## 2013-06-26 ENCOUNTER — Encounter (HOSPITAL_COMMUNITY): Payer: Self-pay

## 2013-06-26 DIAGNOSIS — K859 Acute pancreatitis without necrosis or infection, unspecified: Secondary | ICD-10-CM

## 2013-06-26 LAB — BASIC METABOLIC PANEL
BUN: 9 mg/dL (ref 6–23)
CHLORIDE: 102 meq/L (ref 96–112)
CO2: 27 meq/L (ref 19–32)
CREATININE: 0.85 mg/dL (ref 0.50–1.10)
Calcium: 9 mg/dL (ref 8.4–10.5)
GFR calc Af Amer: 83 mL/min — ABNORMAL LOW (ref >90)
GFR calc non Af Amer: 72 mL/min — ABNORMAL LOW (ref >90)
Glucose, Bld: 133 mg/dL — ABNORMAL HIGH (ref 70–99)
POTASSIUM: 4.2 meq/L (ref 3.7–5.3)
Sodium: 140 mEq/L (ref 137–147)

## 2013-06-26 LAB — CBC
HEMATOCRIT: 35.9 % — AB (ref 36.0–46.0)
Hemoglobin: 11.9 g/dL — ABNORMAL LOW (ref 12.0–15.0)
MCH: 31 pg (ref 26.0–34.0)
MCHC: 33.1 g/dL (ref 30.0–36.0)
MCV: 93.5 fL (ref 78.0–100.0)
PLATELETS: 371 10*3/uL (ref 150–400)
RBC: 3.84 MIL/uL — AB (ref 3.87–5.11)
RDW: 13.4 % (ref 11.5–15.5)
WBC: 12.7 10*3/uL — AB (ref 4.0–10.5)

## 2013-06-26 LAB — AMYLASE: AMYLASE: 147 U/L — AB (ref 0–105)

## 2013-06-26 MED ORDER — ONDANSETRON HCL 4 MG/2ML IJ SOLN
INTRAMUSCULAR | Status: AC
  Filled 2013-06-26: qty 4

## 2013-06-26 MED ORDER — HYDROMORPHONE HCL PF 1 MG/ML IJ SOLN
1.0000 mg | INTRAMUSCULAR | Status: DC | PRN
  Administered 2013-06-26 (×4): 1 mg via INTRAVENOUS
  Filled 2013-06-26 (×4): qty 1

## 2013-06-26 MED ORDER — HYDROMORPHONE HCL PF 1 MG/ML IJ SOLN
2.0000 mg | INTRAMUSCULAR | Status: DC | PRN
  Administered 2013-06-26 – 2013-06-28 (×12): 2 mg via INTRAVENOUS
  Filled 2013-06-26 (×12): qty 2

## 2013-06-26 MED ORDER — ONDANSETRON 8 MG/NS 50 ML IVPB
8.0000 mg | Freq: Four times a day (QID) | INTRAVENOUS | Status: DC | PRN
  Administered 2013-06-26 – 2013-06-30 (×10): 8 mg via INTRAVENOUS
  Filled 2013-06-26 (×7): qty 8

## 2013-06-26 MED ORDER — ONDANSETRON HCL 4 MG PO TABS
4.0000 mg | ORAL_TABLET | Freq: Four times a day (QID) | ORAL | Status: DC | PRN
  Administered 2013-06-29 – 2013-06-30 (×4): 4 mg via ORAL
  Filled 2013-06-26 (×4): qty 1

## 2013-06-26 MED ORDER — PANTOPRAZOLE SODIUM 40 MG IV SOLR
40.0000 mg | Freq: Two times a day (BID) | INTRAVENOUS | Status: DC
  Administered 2013-06-26 – 2013-07-01 (×10): 40 mg via INTRAVENOUS
  Filled 2013-06-26 (×10): qty 40

## 2013-06-26 NOTE — Progress Notes (Signed)
UR chart review completed.  

## 2013-06-26 NOTE — Progress Notes (Signed)
Subjective: She was admitted last night with pancreatitis. My thanks to [facility] for arranging that. She says she still has significant pain. Her pain medication works but she needed more frequently. She does have some interest in drinking some clear liquids.  Objective: Vital signs in last 24 hours: Temp:  [98.1 F (36.7 C)-98.7 F (37.1 C)] 98.3 F (36.8 C) (02/25 0513) Pulse Rate:  [75-88] 75 (02/25 0513) Resp:  [20] 20 (02/24 2212) BP: (118-182)/(62-84) 125/62 mmHg (02/25 0513) SpO2:  [95 %-97 %] 95 % (02/25 0513) Weight:  [117.935 kg (260 lb)-119.251 kg (262 lb 14.4 oz)] 117.935 kg (260 lb) (02/24 2212) Weight change:  Last BM Date: 06/25/13  Intake/Output from previous day: 02/24 0701 - 02/25 0700 In: -  Out: 925 [Urine:925]  PHYSICAL EXAM General appearance: alert, cooperative and moderately obese Resp: clear to auscultation bilaterally Cardio: regular rate and rhythm, S1, S2 normal, no murmur, click, rub or gallop GI: She has diffuse tenderness without rebound Extremities: extremities normal, atraumatic, no cyanosis or edema  Lab Results:    Basic Metabolic Panel:  Recent Labs  06/25/13 1205 06/26/13 0622  NA 142 140  K 5.0 4.2  CL 100 102  CO2 29 27  GLUCOSE 94 133*  BUN 12 9  CREATININE 0.92 0.85  CALCIUM 9.8 9.0   Liver Function Tests:  Recent Labs  06/25/13 1205  AST 24  ALT 25  ALKPHOS 115  BILITOT 0.4  PROT 8.0  ALBUMIN 3.9    Recent Labs  06/25/13 1205 06/26/13 0622  LIPASE 471*  --   AMYLASE 164* 147*   No results found for this basename: AMMONIA,  in the last 72 hours CBC:  Recent Labs  06/25/13 1205 06/26/13 0622  WBC 11.5* 12.7*  HGB 14.1 11.9*  HCT 41.7 35.9*  MCV 93.7 93.5  PLT 450* 371   Cardiac Enzymes: No results found for this basename: CKTOTAL, CKMB, CKMBINDEX, TROPONINI,  in the last 72 hours BNP: No results found for this basename: PROBNP,  in the last 72 hours D-Dimer: No results found for this  basename: DDIMER,  in the last 72 hours CBG: No results found for this basename: GLUCAP,  in the last 72 hours Hemoglobin A1C: No results found for this basename: HGBA1C,  in the last 72 hours Fasting Lipid Panel:  Recent Labs  06/25/13 2153  TRIG 132   Thyroid Function Tests: No results found for this basename: TSH, T4TOTAL, FREET4, T3FREE, THYROIDAB,  in the last 72 hours Anemia Panel: No results found for this basename: VITAMINB12, FOLATE, FERRITIN, TIBC, IRON, RETICCTPCT,  in the last 72 hours Coagulation: No results found for this basename: LABPROT, INR,  in the last 72 hours Urine Drug Screen: Drugs of Abuse  No results found for this basename: labopia, cocainscrnur, labbenz, amphetmu, thcu, labbarb    Alcohol Level: No results found for this basename: ETH,  in the last 72 hours Urinalysis: No results found for this basename: COLORURINE, APPERANCEUR, LABSPEC, PHURINE, GLUCOSEU, HGBUR, BILIRUBINUR, KETONESUR, PROTEINUR, UROBILINOGEN, NITRITE, LEUKOCYTESUR,  in the last 72 hours Misc. Labs:  ABGS No results found for this basename: PHART, PCO2, PO2ART, TCO2, HCO3,  in the last 72 hours CULTURES No results found for this or any previous visit (from the past 240 hour(s)). Studies/Results: Ct Abdomen Pelvis W Contrast  06/25/2013   CLINICAL DATA:  Abdominal pain  EXAM: CT ABDOMEN AND PELVIS WITH CONTRAST  TECHNIQUE: Multidetector CT imaging of the abdomen and pelvis was performed using the  standard protocol following bolus administration of intravenous contrast.  CONTRAST:  178mL OMNIPAQUE IOHEXOL 300 MG/ML  SOLN  COMPARISON:  CT ABD/PELVIS W CM dated 12/10/2010  FINDINGS: There is stranding can an about the pancreatic head and body. This is compatible with inflammatory change. No acute fluid collection or pseudocyst. No hemorrhage. No ductal dilatation.  Unremarkable gallbladder, spleen, adrenal glands, and kidneys.  Tiny hypodensity in the right lobe of the liver on image 26 is  stable. Diffuse hepatic steatosis.  Large hiatal hernia is stable.  Normal appendix. Sigmoid diverticulosis. Bladder, uterus, and adnexa are unremarkable.  No free fluid.  No vertebral compression deformity. Advanced degenerative disc disease in the lower lumbar spine.  IMPRESSION: Inflammatory changes of the pancreas are compatible with acute pancreatitis.   Electronically Signed   By: Maryclare Bean M.D.   On: 06/25/2013 15:10    Medications:  Prior to Admission:  Prescriptions prior to admission  Medication Sig Dispense Refill  . amLODipine-benazepril (LOTREL) 5-20 MG per capsule Take 1 capsule by mouth daily.      Marland Kitchen aspirin 81 MG tablet Take 81 mg by mouth every other day.       . Coenzyme Q10 (CO Q 10 PO) Take by mouth daily.       Marland Kitchen esomeprazole (NEXIUM) 40 MG capsule Take 40 mg by mouth daily before breakfast.      . Glucosamine-Chondroitin (COSAMIN DS PO) Take by mouth.      . Hydrocodone-Acetaminophen (VICODIN) 5-300 MG TABS Take 1 tablet by mouth 4 (four) times daily as needed.  30 each  0  . Multiple Vitamin (MULTIVITAMIN) tablet Take 1 tablet by mouth daily.      . ondansetron (ZOFRAN) 4 MG tablet Take 1 tablet (4 mg total) by mouth every 8 (eight) hours as needed for nausea or vomiting.  20 tablet  0   Scheduled: . amLODipine  5 mg Oral Daily   And  . benazepril  20 mg Oral Daily  . enoxaparin (LOVENOX) injection  40 mg Subcutaneous Q24H  . pantoprazole (PROTONIX) IV  40 mg Intravenous Q24H   Continuous: . dextrose 5 % and 0.9 % NaCl with KCl 20 mEq/L 175 mL/hr at 06/26/13 0600   QHU:[id number] (DILAUDID) injection, ondansetron (ZOFRAN) IV, ondansetron  Assesment: She has acute pancreatitis. It is not clear why she has this. She had a previous episode in 2012.  She has hypertension which has been well-controlled.  She has GERD and is on treatment for that Active Problems:   * No active hospital problems. *    Plan: Review her ultrasound. Clear liquid diet but I told  her if it makes her pain any worse to stop drinking. I modified her pain medication    LOS: 1 day   Milyn Stapleton L 06/26/2013, 8:55 AM

## 2013-06-26 NOTE — Progress Notes (Signed)
Patient reports heartburn cough and worsening abdominal pain. Patient reports nausea and pain medication not helping. Recommendations; Pantoprazole changed to 40 mg IV every 12 hour. Ondansetron changed to 8 mg IV every 6 when necessary. Increase hydromorphone 2 mg every 2 hours when necessary.

## 2013-06-26 NOTE — Progress Notes (Signed)
Subjective; Patient feels better; she states the medication is working much better she is having to take it every 2-3 hours. She denies nausea chest pain or shortness of breath. Last BM was yesterday.  Objective; BP 126/74  Pulse 75  Temp(Src) 98.3 F (36.8 C) (Oral)  Resp 20  Ht 5\' 7"  (1.702 m)  Wt 260 lb (117.935 kg)  BMI 40.71 kg/m2  SpO2 95% Patient appears to be comfortable. Cardiac exam and rhythm normal S1 and S2. No murmur or gallop noted. Lungs are clear to auscultation. Abdomen is full. Bowel sounds are normal. She has moderate midepigastric tenderness and mild tenderness in both subcostal areas.  Lab data; Ultrasound negative for cholelithiasis or dilated CBD; shows echogenic liver. WBC 12.7, H&H 11.9 and 35.9 and platelet count 371K. Serum sodium 140, potassium 4.2, current 102, CO2 27, BUN 9, creatinine 0.85 Glucose 133. 10 calcium 9.0 Serum amylase 147; was 164 yesterday. Serum triglyceride 132. Serum IgG 4 pending  Assessment; Acute pancreatitis; no obvious etiology. Could be related to Zithromax. Renal function is well-preserved and serum calcium is normal. Mild leukocytosis. Overall she does not have criterion for fulminant pancreatitis. Fatty liver. Transaminases are normal.   Recommendations; Patient is to continue n.p.o. status as she is having significant pain with frequent need for pain medication. For repeat lab in a.m.

## 2013-06-27 LAB — BASIC METABOLIC PANEL
BUN: 9 mg/dL (ref 6–23)
BUN: 13 mg/dL (ref 6–23)
CALCIUM: 8.4 mg/dL (ref 8.4–10.5)
CALCIUM: 8.6 mg/dL (ref 8.4–10.5)
CHLORIDE: 103 meq/L (ref 96–112)
CHLORIDE: 105 meq/L (ref 96–112)
CO2: 26 mEq/L (ref 19–32)
CO2: 23 meq/L (ref 19–32)
CREATININE: 2.05 mg/dL — AB (ref 0.50–1.10)
Creatinine, Ser: 1.46 mg/dL — ABNORMAL HIGH (ref 0.50–1.10)
GFR calc Af Amer: 43 mL/min — ABNORMAL LOW (ref >90)
GFR calc Af Amer: 29 mL/min — ABNORMAL LOW (ref >90)
GFR calc non Af Amer: 37 mL/min — ABNORMAL LOW (ref >90)
GFR calc non Af Amer: 25 mL/min — ABNORMAL LOW (ref >90)
GLUCOSE: 163 mg/dL — AB (ref 70–99)
GLUCOSE: 114 mg/dL — AB (ref 70–99)
Potassium: 5.3 mEq/L (ref 3.7–5.3)
Potassium: 5.1 mEq/L (ref 3.7–5.3)
Sodium: 139 mEq/L (ref 137–147)
Sodium: 140 mEq/L (ref 137–147)

## 2013-06-27 LAB — HEPATIC FUNCTION PANEL
ALBUMIN: 2.8 g/dL — AB (ref 3.5–5.2)
ALK PHOS: 88 U/L (ref 39–117)
ALT: 25 U/L (ref 0–35)
AST: 24 U/L (ref 0–37)
BILIRUBIN TOTAL: 0.3 mg/dL (ref 0.3–1.2)
Bilirubin, Direct: <0.2 mg/dL (ref 0.0–0.3)
Total Protein: 7.1 g/dL (ref 6.0–8.3)

## 2013-06-27 LAB — GLUCOSE, CAPILLARY: GLUCOSE-CAPILLARY: 182 mg/dL — AB (ref 70–99)

## 2013-06-27 LAB — AMYLASE: Amylase: 83 U/L (ref 0–105)

## 2013-06-27 LAB — LIPASE, BLOOD: Lipase: 104 U/L — ABNORMAL HIGH (ref 11–59)

## 2013-06-27 MED ORDER — ONDANSETRON HCL 4 MG/2ML IJ SOLN
INTRAMUSCULAR | Status: AC
  Filled 2013-06-27: qty 4

## 2013-06-27 MED ORDER — DEXTROSE-NACL 5-0.9 % IV SOLN
INTRAVENOUS | Status: DC
  Administered 2013-06-27 – 2013-06-28 (×2): via INTRAVENOUS

## 2013-06-27 NOTE — Progress Notes (Signed)
Subjective: She had trouble with the pain medication. She says she feels much better this morning. She has no new complaints.  Objective: Vital signs in last 24 hours: Temp:  [97.9 F (36.6 C)-98.9 F (37.2 C)] 97.9 F (36.6 C) (02/26 0500) Pulse Rate:  [81-92] 83 (02/26 0500) Resp:  [12-20] 12 (02/26 0500) BP: (108-154)/(44-53) 110/52 mmHg (02/26 0941) SpO2:  [92 %-100 %] 99 % (02/26 0500) Weight change:  Last BM Date: 06/25/13  Intake/Output from previous day: 02/25 0701 - 02/26 0700 In: 300 [P.O.:300] Out: -   PHYSICAL EXAM General appearance: alert, cooperative and mild distress Resp: clear to auscultation bilaterally Cardio: regular rate and rhythm, S1, S2 normal, no murmur, click, rub or gallop GI: She is mildly tender in the epigastric region Extremities: extremities normal, atraumatic, no cyanosis or edema  Lab Results:    Basic Metabolic Panel:  Recent Labs  06/26/13 0622 06/27/13 0616  NA 140 139  K 4.2 5.3  CL 102 103  CO2 27 26  GLUCOSE 133* 163*  BUN 9 9  CREATININE 0.85 1.46*  CALCIUM 9.0 8.4   Liver Function Tests:  Recent Labs  06/25/13 1205 06/27/13 0616  AST 24 24  ALT 25 25  ALKPHOS 115 88  BILITOT 0.4 0.3  PROT 8.0 7.1  ALBUMIN 3.9 2.8*    Recent Labs  06/25/13 1205 06/26/13 0622 06/27/13 0616  LIPASE 471*  --  104*  AMYLASE 164* 147* 83   No results found for this basename: AMMONIA,  in the last 72 hours CBC:  Recent Labs  06/25/13 1205 06/26/13 0622  WBC 11.5* 12.7*  HGB 14.1 11.9*  HCT 41.7 35.9*  MCV 93.7 93.5  PLT 450* 371   Cardiac Enzymes: No results found for this basename: CKTOTAL, CKMB, CKMBINDEX, TROPONINI,  in the last 72 hours BNP: No results found for this basename: PROBNP,  in the last 72 hours D-Dimer: No results found for this basename: DDIMER,  in the last 72 hours CBG:  Recent Labs  06/27/13 0304  GLUCAP 182*   Hemoglobin A1C: No results found for this basename: HGBA1C,  in the last  72 hours Fasting Lipid Panel:  Recent Labs  06/25/13 2153  TRIG 132   Thyroid Function Tests: No results found for this basename: TSH, T4TOTAL, FREET4, T3FREE, THYROIDAB,  in the last 72 hours Anemia Panel: No results found for this basename: VITAMINB12, FOLATE, FERRITIN, TIBC, IRON, RETICCTPCT,  in the last 72 hours Coagulation: No results found for this basename: LABPROT, INR,  in the last 72 hours Urine Drug Screen: Drugs of Abuse  No results found for this basename: labopia, cocainscrnur, labbenz, amphetmu, thcu, labbarb    Alcohol Level: No results found for this basename: ETH,  in the last 72 hours Urinalysis: No results found for this basename: COLORURINE, APPERANCEUR, LABSPEC, PHURINE, GLUCOSEU, HGBUR, BILIRUBINUR, KETONESUR, PROTEINUR, UROBILINOGEN, NITRITE, LEUKOCYTESUR,  in the last 72 hours Misc. Labs:  ABGS No results found for this basename: PHART, PCO2, PO2ART, TCO2, HCO3,  in the last 72 hours CULTURES No results found for this or any previous visit (from the past 240 hour(s)). Studies/Results: US Abdomen Complete  06/26/2013   CLINICAL DATA:  Acute pancreatitis.  EXAM: ULTRASOUND ABDOMEN COMPLETE  COMPARISON:  CT scan dated 06/25/2013  FINDINGS: Gallbladder:  No gallstones or wall thickening visualized. No sonographic Murphy sign noted.  Common bile duct:  Diameter: 3.4 mm, normal.  Liver:  Slight hepatomegaly. Echogenic liver parenchyma consistent with hepatic steatosis. Prominent  left lobe. No focal lesions.  IVC:  Normal.  Pancreas:  The pancreas is prominent without focal lesions. No cysts or detectable peripancreatic fluid collections.  Spleen:  Normal.  3.9 cm.  Right Kidney:  Length: 11.0 cm. Echogenicity within normal limits. No mass or hydronephrosis visualized.  Left Kidney:  Length: 10.8 cm. Echogenicity within normal limits. No mass or hydronephrosis visualized.  Abdominal aorta:  Normal.  2.9 cm maximum diameter.  Other findings:  None.  IMPRESSION:  Diffusely prominent pancreas. Hepatomegaly with hepatic steatosis. Normal biliary tree.   Electronically Signed   By: Rozetta Nunnery M.D.   On: 06/26/2013 09:09   Ct Abdomen Pelvis W Contrast  06/25/2013   CLINICAL DATA:  Abdominal pain  EXAM: CT ABDOMEN AND PELVIS WITH CONTRAST  TECHNIQUE: Multidetector CT imaging of the abdomen and pelvis was performed using the standard protocol following bolus administration of intravenous contrast.  CONTRAST:  154mL OMNIPAQUE IOHEXOL 300 MG/ML  SOLN  COMPARISON:  CT ABD/PELVIS W CM dated 12/10/2010  FINDINGS: There is stranding can an about the pancreatic head and body. This is compatible with inflammatory change. No acute fluid collection or pseudocyst. No hemorrhage. No ductal dilatation.  Unremarkable gallbladder, spleen, adrenal glands, and kidneys.  Tiny hypodensity in the right lobe of the liver on image 26 is stable. Diffuse hepatic steatosis.  Large hiatal hernia is stable.  Normal appendix. Sigmoid diverticulosis. Bladder, uterus, and adnexa are unremarkable.  No free fluid.  No vertebral compression deformity. Advanced degenerative disc disease in the lower lumbar spine.  IMPRESSION: Inflammatory changes of the pancreas are compatible with acute pancreatitis.   Electronically Signed   By: Maryclare Bean M.D.   On: 06/25/2013 15:10    Medications:  Prior to Admission:  Prescriptions prior to admission  Medication Sig Dispense Refill  . amLODipine-benazepril (LOTREL) 5-20 MG per capsule Take 1 capsule by mouth daily.      Marland Kitchen aspirin EC 81 MG tablet Take 81 mg by mouth daily.      Marland Kitchen B-Complex TABS Take 1 tablet by mouth daily.      . Hydrocodone-Acetaminophen (VICODIN) 5-300 MG TABS Take 1 tablet by mouth 4 (four) times daily as needed. pain      . ondansetron (ZOFRAN) 4 MG tablet Take 1 tablet (4 mg total) by mouth every 8 (eight) hours as needed for nausea or vomiting.  20 tablet  0  . Coenzyme Q10 (CO Q 10 PO) Take by mouth daily.       Marland Kitchen esomeprazole (NEXIUM)  40 MG capsule Take 40 mg by mouth at bedtime.       . Glucosamine-Chondroitin (COSAMIN DS PO) Take 1 tablet by mouth 2 (two) times daily.       . Multiple Vitamin (MULTIVITAMIN) tablet Take 1 tablet by mouth daily.       Scheduled: . amLODipine  5 mg Oral Daily   And  . benazepril  20 mg Oral Daily  . enoxaparin (LOVENOX) injection  40 mg Subcutaneous Q24H  . pantoprazole (PROTONIX) IV  40 mg Intravenous Q12H   Continuous: . dextrose 5 % and 0.9 % NaCl with KCl 20 mEq/L 125 mL/hr at 06/27/13 0944   QQI:[id number] (DILAUDID) injection, ondansetron (ZOFRAN) IV, ondansetron  Assesment: She was admitted with acute pancreatitis. This is a recurrent problem. She had some disorientation with pain medication so the pain medication has been reduced but she feels better anyway. She feels like her pain is much improved today. Her renal function  is not quite as good. Active Problems:   * No active hospital problems. *    Plan: Continue IV fluids. Continue to monitor renal function. Continue to minimize pain medications but treat her pain if needed    LOS: 2 days   Evita Merida L 06/27/2013, 10:19 AM

## 2013-06-27 NOTE — Progress Notes (Signed)
Patient was disoriented during night.  Pain medicine had been increased and was given per patient request.  Checked on patient at 3am, patient was difficult to arouse, lethargic, and disoriented.  Vitals signs done and cbg done and both were wnl. ( 115/44, 76, 92% on ra, cbg 182).   Held patient medicine until patient was at baseline mentation. At 5am, patient alert and oriented x4, at baseline.  Explained to patient that pain medicine was probable reason of disorientation.  Told patient would adminster pain medicine, but not as often as two hours, to keep patient alert. Patient is also unsteady upon standing,needs assistance with balance. Patient agreeable to parameters of pain medicine administration. Will continue to monitor patient.

## 2013-06-27 NOTE — Progress Notes (Signed)
Subjective; Patient feels much better. She has minimal pain. Cough is gone. She has some itching. She is passing flatus. She had precordial pain this morning lasting for several minutes but forgot to tell Dr. Luan Pulling. He denies shortness of breath.  Objective; BP 110/52  Pulse 83  Temp(Src) 97.9 F (36.6 C) (Oral)  Resp 12  Ht 5\' 7"  (1.702 m)  Wt 260 lb (117.935 kg)  BMI 40.71 kg/m2  SpO2 99% Patient is alert and appears to be in no acute distress. Cardiac exam with regular rhythm normal S1 and S2. No murmur or gallop noted. Lungs are clear to auscultation. Abdomen is full bowel sounds are normal. Abdomen is soft with mild midepigastric tenderness. No organomegaly or masses. No LE edema.  Lab data; Serum sodium 139, potassium 5.3, chloride 103, CO2 26, BUN 9 and creatinine 1.46 Glucose 163. Serum calcium 8.4 Bili 0.3, AP 88, AST 24, ALT 25, and albumin 2.8 Serum amylase 83 and lipase 104  Assessment; #1. Acute pancreatitis cause unknown. First episode in June 2012. Significant improvement in the last 24 hours. She is having less pain and less need for pain medication. Mild pruritus possibly secondary to hydrocodone. Will monitor. All parameters favorable except serum creatinine elevated. #2. Single episode of chest pain this morning. #3. GERD.  Recommendations; Clear liquid diet. CBC metabolic 7 the serum amylase and lipase in a.m. EKG. DC KCL in IV fluids. Pancreatic endoscopic ultrasound in 3-4 weeks.

## 2013-06-27 NOTE — Progress Notes (Signed)
EKG reveals normal sinus rhythm and no ST-T wave abnormalities. Results given to the patient's husband.

## 2013-06-28 DIAGNOSIS — K922 Gastrointestinal hemorrhage, unspecified: Secondary | ICD-10-CM

## 2013-06-28 DIAGNOSIS — D509 Iron deficiency anemia, unspecified: Secondary | ICD-10-CM

## 2013-06-28 DIAGNOSIS — N179 Acute kidney failure, unspecified: Secondary | ICD-10-CM | POA: Diagnosis not present

## 2013-06-28 LAB — AMYLASE: AMYLASE: 44 U/L (ref 0–105)

## 2013-06-28 LAB — BASIC METABOLIC PANEL
BUN: 16 mg/dL (ref 6–23)
CALCIUM: 8.6 mg/dL (ref 8.4–10.5)
CO2: 23 meq/L (ref 19–32)
CREATININE: 2.25 mg/dL — AB (ref 0.50–1.10)
Chloride: 106 mEq/L (ref 96–112)
GFR calc Af Amer: 26 mL/min — ABNORMAL LOW (ref >90)
GFR, EST NON AFRICAN AMERICAN: 22 mL/min — AB (ref >90)
Glucose, Bld: 107 mg/dL — ABNORMAL HIGH (ref 70–99)
Potassium: 4.8 mEq/L (ref 3.7–5.3)
SODIUM: 140 meq/L (ref 137–147)

## 2013-06-28 LAB — CBC
HCT: 32.8 % — ABNORMAL LOW (ref 36.0–46.0)
Hemoglobin: 10.5 g/dL — ABNORMAL LOW (ref 12.0–15.0)
MCH: 31.4 pg (ref 26.0–34.0)
MCHC: 32 g/dL (ref 30.0–36.0)
MCV: 98.2 fL (ref 78.0–100.0)
PLATELETS: 346 10*3/uL (ref 150–400)
RBC: 3.34 MIL/uL — ABNORMAL LOW (ref 3.87–5.11)
RDW: 14.2 % (ref 11.5–15.5)
WBC: 14.8 10*3/uL — ABNORMAL HIGH (ref 4.0–10.5)

## 2013-06-28 LAB — SODIUM, URINE, RANDOM: SODIUM UR: 110 meq/L

## 2013-06-28 LAB — CREATININE, URINE, RANDOM: Creatinine, Urine: 232.75 mg/dL

## 2013-06-28 LAB — LIPASE, BLOOD: Lipase: 44 U/L (ref 11–59)

## 2013-06-28 MED ORDER — HYDROMORPHONE HCL PF 1 MG/ML IJ SOLN
1.5000 mg | INTRAMUSCULAR | Status: DC | PRN
  Administered 2013-06-28 – 2013-06-29 (×7): 1.5 mg via INTRAVENOUS
  Filled 2013-06-28 (×7): qty 2

## 2013-06-28 MED ORDER — SODIUM CHLORIDE 0.9 % IV SOLN
INTRAVENOUS | Status: DC
  Administered 2013-06-28 – 2013-06-30 (×7): via INTRAVENOUS

## 2013-06-28 MED ORDER — ACETAMINOPHEN 500 MG PO TABS
500.0000 mg | ORAL_TABLET | ORAL | Status: DC | PRN
  Administered 2013-06-28 (×2): 500 mg via ORAL
  Filled 2013-06-28 (×2): qty 1

## 2013-06-28 MED ORDER — BOOST / RESOURCE BREEZE PO LIQD: 1.0000 | Freq: Three times a day (TID) | ORAL | Status: DC

## 2013-06-28 MED ORDER — PROMETHAZINE HCL 25 MG/ML IJ SOLN
12.5000 mg | INTRAMUSCULAR | Status: DC | PRN
  Administered 2013-06-28: 12.5 mg via INTRAVENOUS
  Filled 2013-06-28: qty 1

## 2013-06-28 MED ORDER — ONDANSETRON HCL 4 MG/2ML IJ SOLN
INTRAMUSCULAR | Status: AC
  Filled 2013-06-28: qty 4

## 2013-06-28 NOTE — Consult Note (Signed)
Reason for Consult: AKI Referring Physician: Dr. Jonny Ruiz MARCILLE BARMAN is an 63 y.o. female.  HPI:  She is a patient with history of HTN and History of previous pancreatitis . She 2 episodes of pancreatitis the last one being input was in 12 and recovered fully. Presently patient came with abdominal pain and some nausea for about 5-6 days duration. Patient denies any vomiting no diarrhea. She denies also any fever chills or sweating. Presently patient does not have any previous history of renal failure or kidney stone. Patient also denies any history of taking nonsteroidal except the last day before coming to the hospital because of abdominal pain. Presently patient was found to have acute pancreatitis and admitted to the hospital for further workup and management. Her creatinine presently has increased. Patient denies any nausea or vomiting. Her abdominal pain is also improving.  Past Medical History  Diagnosis Date  . HTN (hypertension)   . Reflux     Past Surgical History  Procedure Laterality Date  . Foot surgery    . Laparoscopic endometriosis fulguration    . Trigger thumb      rt thumb last year    Family History  Problem Relation Age of Onset  . Diabetes      Social History:  reports that she has never smoked. She does not have any smokeless tobacco history on file. She reports that she does not drink alcohol or use illicit drugs.  Allergies:  Allergies  Allergen Reactions  . Sulfa Antibiotics Itching and Rash    Medications: I have reviewed the patient's current medications.  Results for orders placed during the hospital encounter of 06/25/13 (from the past 48 hour(s))  GLUCOSE, CAPILLARY     Status: Abnormal   Collection Time    06/27/13  3:04 AM      Result Value Ref Range   Glucose-Capillary 182 (*) 70 - 99 mg/dL  AMYLASE     Status: None   Collection Time    06/27/13  6:16 AM      Result Value Ref Range   Amylase 83  0 - 105 U/L  LIPASE, BLOOD     Status:  Abnormal   Collection Time    06/27/13  6:16 AM      Result Value Ref Range   Lipase 104 (*) 11 - 59 U/L  BASIC METABOLIC PANEL     Status: Abnormal   Collection Time    06/27/13  6:16 AM      Result Value Ref Range   Sodium 139  137 - 147 mEq/L   Potassium 5.3  3.7 - 5.3 mEq/L   Comment: DELTA CHECK NOTED   Chloride 103  96 - 112 mEq/L   CO2 26  19 - 32 mEq/L   Glucose, Bld 163 (*) 70 - 99 mg/dL   BUN 9  6 - 23 mg/dL   Creatinine, Ser 1.46 (*) 0.50 - 1.10 mg/dL   Calcium 8.4  8.4 - 10.5 mg/dL   GFR calc non Af Amer 37 (*) >90 mL/min   GFR calc Af Amer 43 (*) >90 mL/min   Comment: (NOTE)     The eGFR has been calculated using the CKD EPI equation.     This calculation has not been validated in all clinical situations.     eGFR's persistently <90 mL/min signify possible Chronic Kidney     Disease.  HEPATIC FUNCTION PANEL     Status: Abnormal   Collection Time  06/27/13  6:16 AM      Result Value Ref Range   Total Protein 7.1  6.0 - 8.3 g/dL   Albumin 2.8 (*) 3.5 - 5.2 g/dL   AST 24  0 - 37 U/L   ALT 25  0 - 35 U/L   Alkaline Phosphatase 88  39 - 117 U/L   Total Bilirubin 0.3  0.3 - 1.2 mg/dL   Bilirubin, Direct <0.2  0.0 - 0.3 mg/dL   Indirect Bilirubin NOT CALCULATED  0.3 - 0.9 mg/dL  BASIC METABOLIC PANEL     Status: Abnormal   Collection Time    06/27/13  5:06 PM      Result Value Ref Range   Sodium 140  137 - 147 mEq/L   Potassium 5.1  3.7 - 5.3 mEq/L   Chloride 105  96 - 112 mEq/L   CO2 23  19 - 32 mEq/L   Glucose, Bld 114 (*) 70 - 99 mg/dL   BUN 13  6 - 23 mg/dL   Creatinine, Ser 2.05 (*) 0.50 - 1.10 mg/dL   Calcium 8.6  8.4 - 10.5 mg/dL   GFR calc non Af Amer 25 (*) >90 mL/min   GFR calc Af Amer 29 (*) >90 mL/min   Comment: (NOTE)     The eGFR has been calculated using the CKD EPI equation.     This calculation has not been validated in all clinical situations.     eGFR's persistently <90 mL/min signify possible Chronic Kidney     Disease.  BASIC  METABOLIC PANEL     Status: Abnormal   Collection Time    06/28/13  6:24 AM      Result Value Ref Range   Sodium 140  137 - 147 mEq/L   Potassium 4.8  3.7 - 5.3 mEq/L   Chloride 106  96 - 112 mEq/L   CO2 23  19 - 32 mEq/L   Glucose, Bld 107 (*) 70 - 99 mg/dL   BUN 16  6 - 23 mg/dL   Creatinine, Ser 2.25 (*) 0.50 - 1.10 mg/dL   Calcium 8.6  8.4 - 10.5 mg/dL   GFR calc non Af Amer 22 (*) >90 mL/min   GFR calc Af Amer 26 (*) >90 mL/min   Comment: (NOTE)     The eGFR has been calculated using the CKD EPI equation.     This calculation has not been validated in all clinical situations.     eGFR's persistently <90 mL/min signify possible Chronic Kidney     Disease.  CBC     Status: Abnormal   Collection Time    06/28/13  6:24 AM      Result Value Ref Range   WBC 14.8 (*) 4.0 - 10.5 K/uL   RBC 3.34 (*) 3.87 - 5.11 MIL/uL   Hemoglobin 10.5 (*) 12.0 - 15.0 g/dL   HCT 32.8 (*) 36.0 - 46.0 %   MCV 98.2  78.0 - 100.0 fL   MCH 31.4  26.0 - 34.0 pg   MCHC 32.0  30.0 - 36.0 g/dL   RDW 14.2  11.5 - 15.5 %   Platelets 346  150 - 400 K/uL  AMYLASE     Status: None   Collection Time    06/28/13  6:24 AM      Result Value Ref Range   Amylase 44  0 - 105 U/L  LIPASE, BLOOD     Status: None   Collection Time  06/28/13  6:24 AM      Result Value Ref Range   Lipase 44  11 - 59 U/L    US Abdomen Complete  06/26/2013   CLINICAL DATA:  Acute pancreatitis.  EXAM: ULTRASOUND ABDOMEN COMPLETE  COMPARISON:  CT scan dated 06/25/2013  FINDINGS: Gallbladder:  No gallstones or wall thickening visualized. No sonographic Murphy sign noted.  Common bile duct:  Diameter: 3.4 mm, normal.  Liver:  Slight hepatomegaly. Echogenic liver parenchyma consistent with hepatic steatosis. Prominent left lobe. No focal lesions.  IVC:  Normal.  Pancreas:  The pancreas is prominent without focal lesions. No cysts or detectable peripancreatic fluid collections.  Spleen:  Normal.  3.9 cm.  Right Kidney:  Length: 11.0 cm.  Echogenicity within normal limits. No mass or hydronephrosis visualized.  Left Kidney:  Length: 10.8 cm. Echogenicity within normal limits. No mass or hydronephrosis visualized.  Abdominal aorta:  Normal.  2.9 cm maximum diameter.  Other findings:  None.  IMPRESSION: Diffusely prominent pancreas. Hepatomegaly with hepatic steatosis. Normal biliary tree.   Electronically Signed   By: Rozetta Nunnery M.D.   On: 06/26/2013 09:09    Review of Systems  Constitutional: Negative for fever and chills.  Respiratory: Negative for shortness of breath.   Cardiovascular: Negative for orthopnea.  Gastrointestinal: Positive for nausea and abdominal pain. Negative for vomiting.   Blood pressure 147/56, pulse 73, temperature 97.5 F (36.4 C), temperature source Oral, resp. rate 16, height '5\' 7"'  (1.702 m), weight 117.935 kg (260 lb), SpO2 92.00%. Physical Exam  Constitutional: No distress.  Eyes: No scleral icterus.  Neck: No JVD present.  Cardiovascular: Normal rate and regular rhythm.   Respiratory: She has no wheezes. She has no rales.  GI: She exhibits no distension. There is no tenderness. There is no rebound and no guarding.  Musculoskeletal: She exhibits no edema.    Assessment/Plan: Problem #1 acute kidney injury:. Her creatinine when she came 2 point 4015 was 0.9. Patient had CT scan with contrast the same day. Since then her BUN and creatinine has been increasing. Hence etiology could be dye-induced acute kidney injury. Since patient has also history of acute pancreatitis prerenal syndrome and ATN cannot ruled out. Patient presently is none oliguric. Patient was also on ACE inhibitor. Problem #2 hypertension her blood pressure seems to be reasonably controlled Problem #3 history of acute pancreatitis Problem #4 history of GERD Problem #5 anemia Problem #6 hepatomegaly Problem #7 obesity. Plan: Increase IV fluid to to 145 cc per hour We'll hold Lotensin. Will start patient on Lasix  40 mg IV twice  a day. We'll check urine sodium, creatinine today. We'll check also iron studies, basic metabolic panel, CBC and phosphorus in the morning.   Mylez Venable S 06/28/2013, 8:29 AM

## 2013-06-28 NOTE — Progress Notes (Signed)
06/28/13 1754 Patient groggy and sleepy this evening after receiving phenergan 12.5 IV as ordered for nausea.  Patient's husband stated "she woke up and said she doesn't want that anymore, made her too groggy".  Patient c/o nausea this evening, Zofran given as ordered PRN. Pt rated abdominal pain 4/10 on scale of 0-10. Stated "nausea worse than pain". Discussed no dilaudid at this time due to sedation/being groggy. Patient stated she understood. Husband at bedside. Bedalarm on for safety. Call light within reach. Donavan Foil, RN

## 2013-06-28 NOTE — Progress Notes (Signed)
Subjective; Patient does not feel well. She complains of nausea. She's holding emesis basin. She describes her pain to be on for her pain medication she gets complete relief. She denies chest pain.  Objective; BP 147/56  Pulse 73  Temp(Src) 97.5 F (36.4 C) (Oral)  Resp 16  Ht 5\' 7"  (1.702 m)  Wt 260 lb (117.935 kg)  BMI 40.71 kg/m2  SpO2 92% Patient is alert and does not appear to be acutely ill. Cardiac exam regular rhythm normal S1 and S2. No murmur or gallop noted. Lungs are clear to auscultation. Abdomen is full. Bowel sounds are normal. Abdomen is soft with mild midepigastric tenderness. No organomegaly or masses. No LE edema noted.  Lab data; WBC 14.8, H&H 10.5 and 32.8 and platelet count 346K Serum amylase 44 and lipase is also 44. Serum sodium 140, potassium 4.8, chloride 106, CO2 23, BUN 16, creatinine 2.25. Glucose 107 and serum calcium 8.6. Serum IgG 4 is pending.  Assessment; 1. Acute pancreatitis. Etiology unclear. No evidence of leak no cholelithiasis, hypercalcemia or hypertriglyceridemia. Patient was on Z-Pak recently this is probably not the cause as she had one episode when a half years ago. She did not tolerate full liquids but this may be a function of her renal failure rather than pancreatitis. Serum amylase and lipase are normal. #2. Non-oliguric renal failure. Patient has been evaluated by Dr. Hinda Lenis. Suspect acute injury secondary to IV contrast. Doubt that renal injury secondary to dehydration but could be due to pancreatitis. #3. Anemia. No evidence of GI bleed. Iron studies are pending.  Recommendations; Hydromorphone dose decreased to 1.5 mg IV as it may be causing her nausea. Dietary consultation. Pancreatic endoscopic ultrasound in 4 weeks.  Dr. Gala Romney will see patient over the weekend for GI issues.

## 2013-06-28 NOTE — Progress Notes (Signed)
06/28/13 1845 Patient alert, no longer groggy this evening at 1840. Requested dilaudid for pain, rated pain 4/10 on 0-10 pain scale. Facial grimacing noted with pain. Dilaudid given as ordered. Nursing to monitor. Donavan Foil, RN

## 2013-06-28 NOTE — Progress Notes (Signed)
Subjective: She says she feels better. She has less abdominal pain. However her renal function has continued to deteriorate.  Objective: Vital signs in last 24 hours: Temp:  [97.5 F (36.4 C)-98.4 F (36.9 C)] 97.5 F (36.4 C) (02/27 0557) Pulse Rate:  [73-86] 73 (02/27 0557) Resp:  [14-18] 16 (02/27 0557) BP: (110-147)/(38-69) 147/56 mmHg (02/27 0557) SpO2:  [90 %-92 %] 92 % (02/27 0557) Weight change:  Last BM Date: 06/25/13  Intake/Output from previous day: 02/26 0701 - 02/27 0700 In: 660 [P.O.:660] Out: -   PHYSICAL EXAM General appearance: alert, cooperative and mild distress Resp: clear to auscultation bilaterally Cardio: regular rate and rhythm, S1, S2 normal, no murmur, click, rub or gallop GI: She is still mildly tender Extremities: extremities normal, atraumatic, no cyanosis or edema  Lab Results:    Basic Metabolic Panel:  Recent Labs  06/27/13 1706 06/28/13 0624  NA 140 140  K 5.1 4.8  CL 105 106  CO2 23 23  GLUCOSE 114* 107*  BUN 13 16  CREATININE 2.05* 2.25*  CALCIUM 8.6 8.6   Liver Function Tests:  Recent Labs  06/25/13 1205 06/27/13 0616  AST 24 24  ALT 25 25  ALKPHOS 115 88  BILITOT 0.4 0.3  PROT 8.0 7.1  ALBUMIN 3.9 2.8*    Recent Labs  06/27/13 0616 06/28/13 0624  LIPASE 104* 44  AMYLASE 83 44   No results found for this basename: AMMONIA,  in the last 72 hours CBC:  Recent Labs  06/26/13 0622 06/28/13 0624  WBC 12.7* 14.8*  HGB 11.9* 10.5*  HCT 35.9* 32.8*  MCV 93.5 98.2  PLT 371 346   Cardiac Enzymes: No results found for this basename: CKTOTAL, CKMB, CKMBINDEX, TROPONINI,  in the last 72 hours BNP: No results found for this basename: PROBNP,  in the last 72 hours D-Dimer: No results found for this basename: DDIMER,  in the last 72 hours CBG:  Recent Labs  06/27/13 0304  GLUCAP 182*   Hemoglobin A1C: No results found for this basename: HGBA1C,  in the last 72 hours Fasting Lipid Panel:  Recent  Labs  06/25/13 2153  TRIG 132   Thyroid Function Tests: No results found for this basename: TSH, T4TOTAL, FREET4, T3FREE, THYROIDAB,  in the last 72 hours Anemia Panel: No results found for this basename: VITAMINB12, FOLATE, FERRITIN, TIBC, IRON, RETICCTPCT,  in the last 72 hours Coagulation: No results found for this basename: LABPROT, INR,  in the last 72 hours Urine Drug Screen: Drugs of Abuse  No results found for this basename: labopia, cocainscrnur, labbenz, amphetmu, thcu, labbarb    Alcohol Level: No results found for this basename: ETH,  in the last 72 hours Urinalysis: No results found for this basename: COLORURINE, APPERANCEUR, LABSPEC, PHURINE, GLUCOSEU, HGBUR, BILIRUBINUR, KETONESUR, PROTEINUR, UROBILINOGEN, NITRITE, LEUKOCYTESUR,  in the last 72 hours Misc. Labs:  ABGS No results found for this basename: PHART, PCO2, PO2ART, TCO2, HCO3,  in the last 72 hours CULTURES No results found for this or any previous visit (from the past 240 hour(s)). Studies/Results: US Abdomen Complete  06/26/2013   CLINICAL DATA:  Acute pancreatitis.  EXAM: ULTRASOUND ABDOMEN COMPLETE  COMPARISON:  CT scan dated 06/25/2013  FINDINGS: Gallbladder:  No gallstones or wall thickening visualized. No sonographic Murphy sign noted.  Common bile duct:  Diameter: 3.4 mm, normal.  Liver:  Slight hepatomegaly. Echogenic liver parenchyma consistent with hepatic steatosis. Prominent left lobe. No focal lesions.  IVC:  Normal.  Pancreas:  The pancreas is prominent without focal lesions. No cysts or detectable peripancreatic fluid collections.  Spleen:  Normal.  3.9 cm.  Right Kidney:  Length: 11.0 cm. Echogenicity within normal limits. No mass or hydronephrosis visualized.  Left Kidney:  Length: 10.8 cm. Echogenicity within normal limits. No mass or hydronephrosis visualized.  Abdominal aorta:  Normal.  2.9 cm maximum diameter.  Other findings:  None.  IMPRESSION: Diffusely prominent pancreas. Hepatomegaly with  hepatic steatosis. Normal biliary tree.   Electronically Signed   By: Rozetta Nunnery M.D.   On: 06/26/2013 09:09    Medications:  Prior to Admission:  Prescriptions prior to admission  Medication Sig Dispense Refill  . amLODipine-benazepril (LOTREL) 5-20 MG per capsule Take 1 capsule by mouth daily.      Marland Kitchen aspirin EC 81 MG tablet Take 81 mg by mouth daily.      Marland Kitchen B-Complex TABS Take 1 tablet by mouth daily.      . Hydrocodone-Acetaminophen (VICODIN) 5-300 MG TABS Take 1 tablet by mouth 4 (four) times daily as needed. pain      . ondansetron (ZOFRAN) 4 MG tablet Take 1 tablet (4 mg total) by mouth every 8 (eight) hours as needed for nausea or vomiting.  20 tablet  0  . Coenzyme Q10 (CO Q 10 PO) Take by mouth daily.       Marland Kitchen esomeprazole (NEXIUM) 40 MG capsule Take 40 mg by mouth at bedtime.       . Glucosamine-Chondroitin (COSAMIN DS PO) Take 1 tablet by mouth 2 (two) times daily.       . Multiple Vitamin (MULTIVITAMIN) tablet Take 1 tablet by mouth daily.       Scheduled: . amLODipine  5 mg Oral Daily   And  . benazepril  20 mg Oral Daily  . enoxaparin (LOVENOX) injection  40 mg Subcutaneous Q24H  . pantoprazole (PROTONIX) IV  40 mg Intravenous Q12H   Continuous: . sodium chloride     EVO:[id number], HYDROmorphone (DILAUDID) injection, ondansetron (ZOFRAN) IV, ondansetron  Assesment: She was admitted with acute pancreatitis. She has had progressive decline in her renal function since admission. She has improved as far as pancreatic situation is concerned. Active Problems:   * No active hospital problems. *    Plan: I have requested nephrology consultation. Continue with all of her other treatments. I don't think she can go home today    LOS: 3 days   Ketina Mars L 06/28/2013, 8:36 AM

## 2013-06-28 NOTE — Progress Notes (Signed)
06/28/13 1545 Patient c/o nausea this afternoon. Not yet time for zofran as ordered PRN for nausea/vomiting. Notified Dr. Willey Blade, who was on call for Dr. Luan Pulling. Notified of c/o nausea, zofran not due again until this evening. Order received for phenergan 12.5 IV every 4 hours PRN nausea/vomiting. Discussed with patient that phernergan and dilaudid may not be received at same time due potential for sedation, would need to be given at separate times. Patient states she understands. Donavan Foil, RN

## 2013-06-28 NOTE — Progress Notes (Signed)
INITIAL NUTRITION ASSESSMENT  DOCUMENTATION CODES Per approved criteria  -Morbid Obesity   INTERVENTION: Resource Breeze po TID, each supplement provides 250 kcal and 9 grams of protein Bland diet education completed  NUTRITION DIAGNOSIS: Inadequate oral intake related to decreased appetite, nausea as evidenced by PO: 25%.   Goal: Pt will meet >90% of estimated nutritional needs  Monitor:  Diet advancement, PO intake, labs, skin assessments, weight changes, I/O's  Reason for Assessment: MD consutl  63 y.o. female  Admitting Dx: Pancreatitis, acute  ASSESSMENT: Pt admitted with acute pancreatitis and abdominal pain. She reports poor appetite due to nausea for approximately one week. PO: 25%. Pt reports she tolerated clear liquids well, but no full liquids. Meal tray was unattempted at time of visit. Wt hx reveals no significant weight loss and pt confirms this.   Educated pt on diet for discharge home. Discussed potential for diet advancement during hospitalization (clear liquids, full liquids, solid foods). Discussed reasoning for diet restrictions due to pathophysiology of illness. Educated pt on bland diet, focusing on the consumption of cooked fruits and vegetables, lean meats, and refined grains. Discussed importance of avoidance of high fiber, highly seasoned, high fat foods. Also discussed importance of limiting caffeine in diet. Provided specific examples for meal planning with diet restrictions. Provided "Fiber-Restricted Nutrition Therapy" handout from [facility] Nutrition Care Manual. Expect fair to good compliance.   Height: Ht Readings from Last 1 Encounters:  06/25/13 5\' 7"  (1.702 m)    Weight: Wt Readings from Last 1 Encounters:  06/25/13 260 lb (117.935 kg)    Ideal Body Weight: 135#  % Ideal Body Weight: 193%  Wt Readings from Last 10 Encounters:  06/25/13 260 lb (117.935 kg)  06/25/13 262 lb 14.4 oz (119.251 kg)  02/01/12 265 lb (120.203 kg)    Usual  Body Weight: 265#  % Usual Body Weight: 98%  BMI:  Body mass index is 40.71 kg/(m^2). Meets criteria for extreme obesity, class III.  Estimated Nutritional Needs: Kcal: 1700-1800 daily Protein: 94-118 grams daily Fluid: 1.7-1.8 L daily  Skin: WDL  Diet Order: Clear Liquid  EDUCATION NEEDS: -Education needs addressed   Intake/Output Summary (Last 24 hours) at 06/28/13 1626 Last data filed at 06/28/13 1558  Gross per 24 hour  Intake    720 ml  Output    250 ml  Net    470 ml    Last BM: 06/25/13   Labs:   Recent Labs Lab 06/27/13 0616 06/27/13 1706 06/28/13 0624  NA 139 140 140  K 5.3 5.1 4.8  CL 103 105 106  CO2 26 23 23   BUN 9 13 16   CREATININE 1.46* 2.05* 2.25*  CALCIUM 8.4 8.6 8.6  GLUCOSE 163* 114* 107*    CBG (last 3)   Recent Labs  06/27/13 0304  GLUCAP 182*    Scheduled Meds: . amLODipine  5 mg Oral Daily  . enoxaparin (LOVENOX) injection  40 mg Subcutaneous Q24H  . pantoprazole (PROTONIX) IV  40 mg Intravenous Q12H    Continuous Infusions: . sodium chloride 145 mL/hr at 06/28/13 1553    Past Medical History  Diagnosis Date  . HTN (hypertension)   . Reflux     Past Surgical History  Procedure Laterality Date  . Foot surgery    . Laparoscopic endometriosis fulguration    . Trigger thumb      rt thumb last year    Oneta Sigman A. Jimmye Norman, RD, LDN Pager: [phone]

## 2013-06-29 DIAGNOSIS — K859 Acute pancreatitis without necrosis or infection, unspecified: Secondary | ICD-10-CM

## 2013-06-29 LAB — URINALYSIS, ROUTINE W REFLEX MICROSCOPIC
Bilirubin Urine: NEGATIVE
GLUCOSE, UA: NEGATIVE mg/dL
Ketones, ur: NEGATIVE mg/dL
Nitrite: NEGATIVE
PH: 5 (ref 5.0–8.0)
Protein, ur: NEGATIVE mg/dL
Specific Gravity, Urine: >1.03 — ABNORMAL HIGH (ref 1.005–1.030)
Urobilinogen, UA: 0.2 mg/dL (ref 0.0–1.0)

## 2013-06-29 LAB — BASIC METABOLIC PANEL
BUN: 16 mg/dL (ref 6–23)
CO2: 22 mEq/L (ref 19–32)
Calcium: 8.8 mg/dL (ref 8.4–10.5)
Chloride: 107 mEq/L (ref 96–112)
Creatinine, Ser: 1.92 mg/dL — ABNORMAL HIGH (ref 0.50–1.10)
GFR, EST AFRICAN AMERICAN: 31 mL/min — AB (ref >90)
GFR, EST NON AFRICAN AMERICAN: 27 mL/min — AB (ref >90)
GLUCOSE: 98 mg/dL (ref 70–99)
POTASSIUM: 4.4 meq/L (ref 3.7–5.3)
Sodium: 139 mEq/L (ref 137–147)

## 2013-06-29 LAB — CBC
HCT: 31.8 % — ABNORMAL LOW (ref 36.0–46.0)
HEMOGLOBIN: 10.5 g/dL — AB (ref 12.0–15.0)
MCH: 31.9 pg (ref 26.0–34.0)
MCHC: 33 g/dL (ref 30.0–36.0)
MCV: 96.7 fL (ref 78.0–100.0)
Platelets: 377 10*3/uL (ref 150–400)
RBC: 3.29 MIL/uL — ABNORMAL LOW (ref 3.87–5.11)
RDW: 13.7 % (ref 11.5–15.5)
WBC: 12.6 10*3/uL — ABNORMAL HIGH (ref 4.0–10.5)

## 2013-06-29 LAB — IRON AND TIBC
Iron: 18 ug/dL — ABNORMAL LOW (ref 42–135)
SATURATION RATIOS: 9 % — AB (ref 20–55)
TIBC: 193 ug/dL — AB (ref 250–470)
UIBC: 175 ug/dL (ref 125–400)

## 2013-06-29 LAB — URINE MICROSCOPIC-ADD ON

## 2013-06-29 LAB — FERRITIN: Ferritin: 229 ng/mL (ref 10–291)

## 2013-06-29 LAB — PHOSPHORUS: Phosphorus: 3 mg/dL (ref 2.3–4.6)

## 2013-06-29 MED ORDER — OXYCODONE HCL 5 MG PO TABS
5.0000 mg | ORAL_TABLET | ORAL | Status: DC | PRN
  Administered 2013-06-29 – 2013-06-30 (×3): 5 mg via ORAL
  Filled 2013-06-29 (×3): qty 1

## 2013-06-29 MED ORDER — OXYCODONE HCL 5 MG PO TABS
5.0000 mg | ORAL_TABLET | Freq: Once | ORAL | Status: AC
  Administered 2013-06-29: 5 mg via ORAL
  Filled 2013-06-29: qty 1

## 2013-06-29 NOTE — Progress Notes (Signed)
06/29/13 4268 Patient does not want resource breeze supplements as ordered per dietician. States "i don't like berry flavor". Donavan Foil, RN

## 2013-06-29 NOTE — Progress Notes (Signed)
Subjective: She says she feels much improved. Her pain is better. Her renal function is better on laboratory work. He still does not have much appetite  Objective: Vital signs in last 24 hours: Temp:  [97.5 F (36.4 C)-98.6 F (37 C)] 98.6 F (37 C) (02/28 0356) Pulse Rate:  [80-98] 98 (02/28 0356) Resp:  [18] 18 (02/28 0356) BP: (146-167)/(59-90) 167/90 mmHg (02/28 0356) SpO2:  [94 %-95 %] 94 % (02/28 0356) Weight change:  Last BM Date: 06/26/13  Intake/Output from previous day: 02/27 0701 - 02/28 0700 In: 2692.8 [P.O.:1440; I.V.:1152.8; IV Piggyback:100] Out: 1200 [Urine:1200]  PHYSICAL EXAM General appearance: alert, cooperative and mild distress Resp: clear to auscultation bilaterally Cardio: regular rate and rhythm, S1, S2 normal, no murmur, click, rub or gallop GI: soft, non-tender; bowel sounds normal; no masses,  no organomegaly Extremities: extremities normal, atraumatic, no cyanosis or edema  Lab Results:    Basic Metabolic Panel:  Recent Labs  06/28/13 0624 06/29/13 0559 06/29/13 0602  NA 140  --  139  K 4.8  --  4.4  CL 106  --  107  CO2 23  --  22  GLUCOSE 107*  --  98  BUN 16  --  16  CREATININE 2.25*  --  1.92*  CALCIUM 8.6  --  8.8  PHOS  --  3.0  --    Liver Function Tests:  Recent Labs  06/27/13 0616  AST 24  ALT 25  ALKPHOS 88  BILITOT 0.3  PROT 7.1  ALBUMIN 2.8*    Recent Labs  06/27/13 0616 06/28/13 0624  LIPASE 104* 44  AMYLASE 83 44   No results found for this basename: AMMONIA,  in the last 72 hours CBC:  Recent Labs  06/28/13 0624 06/29/13 0602  WBC 14.8* 12.6*  HGB 10.5* 10.5*  HCT 32.8* 31.8*  MCV 98.2 96.7  PLT 346 377   Cardiac Enzymes: No results found for this basename: CKTOTAL, CKMB, CKMBINDEX, TROPONINI,  in the last 72 hours BNP: No results found for this basename: PROBNP,  in the last 72 hours D-Dimer: No results found for this basename: DDIMER,  in the last 72 hours CBG:  Recent Labs  06/27/13 0304  GLUCAP 182*   Hemoglobin A1C: No results found for this basename: HGBA1C,  in the last 72 hours Fasting Lipid Panel: No results found for this basename: CHOL, HDL, LDLCALC, TRIG, CHOLHDL, LDLDIRECT,  in the last 72 hours Thyroid Function Tests: No results found for this basename: TSH, T4TOTAL, FREET4, T3FREE, THYROIDAB,  in the last 72 hours Anemia Panel: No results found for this basename: VITAMINB12, FOLATE, FERRITIN, TIBC, IRON, RETICCTPCT,  in the last 72 hours Coagulation: No results found for this basename: LABPROT, INR,  in the last 72 hours Urine Drug Screen: Drugs of Abuse  No results found for this basename: labopia, cocainscrnur, labbenz, amphetmu, thcu, labbarb    Alcohol Level: No results found for this basename: ETH,  in the last 72 hours Urinalysis: No results found for this basename: COLORURINE, APPERANCEUR, LABSPEC, PHURINE, GLUCOSEU, HGBUR, BILIRUBINUR, KETONESUR, PROTEINUR, UROBILINOGEN, NITRITE, LEUKOCYTESUR,  in the last 72 hours Misc. Labs:  ABGS No results found for this basename: PHART, PCO2, PO2ART, TCO2, HCO3,  in the last 72 hours CULTURES No results found for this or any previous visit (from the past 240 hour(s)). Studies/Results: No results found.  Medications:  Prior to Admission:  Prescriptions prior to admission  Medication Sig Dispense Refill  . amLODipine-benazepril (LOTREL) 5-20  MG per capsule Take 1 capsule by mouth daily.      Marland Kitchen aspirin EC 81 MG tablet Take 81 mg by mouth daily.      Marland Kitchen B-Complex TABS Take 1 tablet by mouth daily.      . Hydrocodone-Acetaminophen (VICODIN) 5-300 MG TABS Take 1 tablet by mouth 4 (four) times daily as needed. pain      . ondansetron (ZOFRAN) 4 MG tablet Take 1 tablet (4 mg total) by mouth every 8 (eight) hours as needed for nausea or vomiting.  20 tablet  0  . Coenzyme Q10 (CO Q 10 PO) Take by mouth daily.       Marland Kitchen esomeprazole (NEXIUM) 40 MG capsule Take 40 mg by mouth at bedtime.       .  Glucosamine-Chondroitin (COSAMIN DS PO) Take 1 tablet by mouth 2 (two) times daily.       . Multiple Vitamin (MULTIVITAMIN) tablet Take 1 tablet by mouth daily.       Scheduled: . amLODipine  5 mg Oral Daily  . enoxaparin (LOVENOX) injection  40 mg Subcutaneous Q24H  . feeding supplement (RESOURCE BREEZE)  1 Container Oral TID BM  . pantoprazole (PROTONIX) IV  40 mg Intravenous Q12H   Continuous: . sodium chloride 145 mL/hr at 06/28/13 2347   OKH:[id number], HYDROmorphone (DILAUDID) injection, ondansetron (ZOFRAN) IV, ondansetron, oxyCODONE, promethazine  Assesment: She was admitted with acute pancreatitis which seems to be much improved. She developed acute kidney injury possibly related to receiving contrast material for CT. She has hypertension at baseline. Her renal function is improving. She said that her medication that she had at home for pain made her very nauseated so she is reluctant to take it Principal Problem:   Pancreatitis, acute Active Problems:   HTN (hypertension)   Obesity   Acute kidney injury    Plan: I'm going to have her try oxycodone and see if she has trouble with nausea from that. She will have repeat laboratory work in the morning and if her renal function continues to improve she can probably go home    LOS: 4 days   Deanna [name] 06/29/2013, 9:55 AM

## 2013-06-29 NOTE — Progress Notes (Signed)
Overall patient states she feels better. Left-sided abdominal pain down to 3/10. Still not very hungry.  Vital signs in last 24 hours: Temp:  [97.5 F (36.4 C)-98.6 F (37 C)] 98.6 F (37 C) (02/28 0356) Pulse Rate:  [80-98] 98 (02/28 0356) Resp:  [18] 18 (02/28 0356) BP: (146-167)/(59-90) 167/90 mmHg (02/28 0356) SpO2:  [94 %-95 %] 94 % (02/28 0356) Last BM Date: 06/26/13 General:   Alert,  Well-developed, well-nourished, pleasant and cooperative in NAD Abdomen:  Obese.  Normal bowel sounds some tenderness to left of the umbilicus. No rebound no obvious mass. Extremities:  Without clubbing or edema.    Intake/Output from previous day: 02/27 0701 - 02/28 0700 In: 2692.8 [P.O.:1440; I.V.:1152.8; IV Piggyback:100] Out: 1200 [Urine:1200] Intake/Output this shift:    Lab Results:  Recent Labs  06/28/13 0624 06/29/13 0602  WBC 14.8* 12.6*  HGB 10.5* 10.5*  HCT 32.8* 31.8*  PLT 346 377   BMET  Recent Labs  06/27/13 1706 06/28/13 0624 06/29/13 0602  NA 140 140 139  K 5.1 4.8 4.4  CL 105 106 107  CO2 23 23 22   GLUCOSE 114* 107* 98  BUN 13 16 16   CREATININE 2.05* 2.25* 1.92*  CALCIUM 8.6 8.6 8.8   LFT  Recent Labs  06/27/13 0616  PROT 7.1  ALBUMIN 2.8*  AST 24  ALT 25  ALKPHOS 88  BILITOT 0.3  BILIDIR <0.2  IBILI NOT CALCULATED    Impression:      Idiopathic pancreatitis improved.  Acute renal failure, likely contrast induced - improved.                            Serum IgG subclass 4 assay remains pending  Recommendations:    Advance diet as tolerated. Followup on pending lab. Endoscopic ultrasound no sooner than 4 weeks from now.

## 2013-06-29 NOTE — Progress Notes (Signed)
Patient complaining that PO pain medicine does not work and would like to talk with Dr. Luan Pulling regarding this matter. I told her I would give her the Dilaudid 1.5 iv this time since her pain is so high and that we would intermitly use the oxycodone as well and I would make a note informing the MD the po pain medicine does not decrease her pain; maybe a higher dosage would work. William Hamburger RN

## 2013-06-29 NOTE — Progress Notes (Signed)
06/29/13 1322 Notified Dr. Luan Pulling of u/a results. Order received for urine culture. Lab states can be added on to urine sample sent this morning. Donavan Foil, RN

## 2013-06-29 NOTE — Progress Notes (Signed)
06/29/13 1738 Patient reported pain decreased to 2/10 on 0-10 pain scale this afternoon after oxycodone 5 mg po given as ordered PRN for pain. Patient aware of transition to po pain medication due to anticipated discharge soon. Pt c/o pain this afternoon, not yet time for oxycodone. Notified Dr Luan Pulling of c/o pain and pt reporting that oxycodone reduced pain with earlier dose. MD aware of diet advanced to low fat/heart healthy. Order received for oxycodone 5 mg po x 1 at 1630. Order placed. Discussed with patient to continue po pain medication if possible. Oxycodone 5 mg po x 1 given as ordered. Verbalized understanding. No c/o nausea this evening. Donavan Foil, RN

## 2013-06-29 NOTE — Progress Notes (Signed)
06/29/13 1125 Patient stated "i don't like the broth and jello". Denies nausea at this time. Stated would like to try food for lunch if possible. Notified Dr. Gala Romney. Order received for low fat/heart healthy diet. Donavan Foil, RN

## 2013-06-29 NOTE — Progress Notes (Signed)
06/29/13 1230 Patient tolerated low fat, heart healthy diet well for lunch. States ate green beans and mashed potatoes. Denies nausea, no c/o abdominal pain at this time. Donavan Foil, RN

## 2013-06-29 NOTE — Progress Notes (Signed)
Subjective: Interval History: has no complaint of nausea or vomiting. Presently she denies any abdominal pain and no diarrhea..  Objective: Vital signs in last 24 hours: Temp:  [97.5 F (36.4 C)-98.6 F (37 C)] 98.6 F (37 C) (02/28 0356) Pulse Rate:  [80-98] 98 (02/28 0356) Resp:  [18] 18 (02/28 0356) BP: (146-167)/(59-90) 167/90 mmHg (02/28 0356) SpO2:  [94 %-95 %] 94 % (02/28 0356) Weight change:   Intake/Output from previous day: 02/27 0701 - 02/28 0700 In: 2692.8 [P.O.:1440; I.V.:1152.8; IV Piggyback:100] Out: 1200 [Urine:1200] Intake/Output this shift:    General appearance: alert, cooperative and no distress Resp: clear to auscultation bilaterally Cardio: regular rate and rhythm, S1, S2 normal, no murmur, click, rub or gallop GI: soft, non-tender; bowel sounds normal; no masses,  no organomegaly Extremities: extremities normal, atraumatic, no cyanosis or edema  Lab Results:  Recent Labs  06/28/13 0624 06/29/13 0602  WBC 14.8* 12.6*  HGB 10.5* 10.5*  HCT 32.8* 31.8*  PLT 346 377   BMET:  Recent Labs  06/28/13 0624 06/29/13 0602  NA 140 139  K 4.8 4.4  CL 106 107  CO2 23 22  GLUCOSE 107* 98  BUN 16 16  CREATININE 2.25* 1.92*  CALCIUM 8.6 8.8   No results found for this basename: PTH,  in the last 72 hours Iron Studies: No results found for this basename: IRON, TIBC, TRANSFERRIN, FERRITIN,  in the last 72 hours  Studies/Results: No results found.  I have reviewed the patient's current medications.  Assessment/Plan: Problem #1 acute kidney injury her BUN is 16 and creatinine is 1.92 renal function seems to be improving. Presently patient is none oliguric. Problem #2 anemia: Her hemoglobin is 10.5 hematocrit 31.5 stable. Problem #3 hypertension her blood pressure seems to be reasonably controlled Problem #4 pancreatitis Problem #5 obesity  Problem #6 metabolic bone disease her calcium and phosphorus is range. Plan: We'll continue his present  management We'll check UA and basic metabolic panel. We'll decrease her IV fluid to 125 cc per hour.   LOS: 4 days   Deanna [name] S 06/29/2013,9:09 AM

## 2013-06-29 NOTE — Plan of Care (Signed)
Problem: Phase I Progression Outcomes Goal: OOB as tolerated unless otherwise ordered Outcome: Progressing 06/29/13 1104 Patient assisted up to chair per nurse tech this morning, tolerated sitting up well. Instructed to call for assist as needed. Donavan Foil, RN

## 2013-06-30 LAB — URINE CULTURE
Colony Count: NO GROWTH
Culture: NO GROWTH
Special Requests: NORMAL

## 2013-06-30 LAB — BASIC METABOLIC PANEL
BUN: 16 mg/dL (ref 6–23)
CO2: 23 mEq/L (ref 19–32)
Calcium: 8.4 mg/dL (ref 8.4–10.5)
Chloride: 102 mEq/L (ref 96–112)
Creatinine, Ser: 1.74 mg/dL — ABNORMAL HIGH (ref 0.50–1.10)
GFR calc Af Amer: 35 mL/min — ABNORMAL LOW (ref >90)
GFR, EST NON AFRICAN AMERICAN: 30 mL/min — AB (ref >90)
GLUCOSE: 84 mg/dL (ref 70–99)
POTASSIUM: 3.6 meq/L — AB (ref 3.7–5.3)
SODIUM: 137 meq/L (ref 137–147)

## 2013-06-30 MED ORDER — POLYSACCHARIDE IRON COMPLEX 150 MG PO CAPS
150.0000 mg | ORAL_CAPSULE | Freq: Two times a day (BID) | ORAL | Status: DC
Start: 2013-06-30 — End: 2013-07-01
  Administered 2013-06-30 – 2013-07-01 (×3): 150 mg via ORAL
  Filled 2013-06-30 (×3): qty 1

## 2013-06-30 MED ORDER — ONDANSETRON 8 MG/NS 50 ML IVPB
8.0000 mg | Freq: Four times a day (QID) | INTRAVENOUS | Status: DC
  Administered 2013-06-30 – 2013-07-01 (×4): 8 mg via INTRAVENOUS
  Filled 2013-06-30 (×9): qty 8

## 2013-06-30 NOTE — Progress Notes (Signed)
06/30/13 2549 Patient c/o nausea this morning, unable to eat breakfast. Offered jello, broth, popsicle. Patient stated would like sprite and orange popsicle, does not care for broth and jello. Denies pain this morning. Zofran given as ordered for nausea. Denies having bowel movement since 06/26/13, denies constipation. Notified Dr. Luan Pulling of c/o persistent nausea and unable to eat breakfast this morning. Also notified pt reported increased pain at times during night. Donavan Foil, RN

## 2013-06-30 NOTE — Progress Notes (Signed)
06/30/13 1233 Patient denies pain at this time. C/o nausea "about the same", states decreased after zofran as ordered. Pt aware of scheduled Zofran per Dr. Luan Pulling order this morning. Clear liquids at this time, tolerating sprite well with no complaints. Does not care for broth or jello. MD aware. Donavan Foil, RN

## 2013-06-30 NOTE — Progress Notes (Signed)
06/30/13 1815 Patient ambulated in hallway this evening with husband. Tolerated fairly well, general weakness, steady gait. Ambulated approximately 50 feet. Nursing requested chicken noodle soup from dietary, provided broth from soup for dinner, since patient has not cared for chicken broth on trays. States "it tasted a little better", tolerated fairly well. No c/o abdominal pain. Nausea continues, but states decreased with scheduled Zofran. Donavan Foil, RN

## 2013-06-30 NOTE — Progress Notes (Signed)
Developed exacerbation of abdominal pain and nausea yesterday when attempting to take a low-fat diet; diet now backed off to clear liquids. She still nauseated but abdominal pain almost totally resolved.   Vital signs in last 24 hours: Temp:  [98.6 F (37 C)-99.4 F (37.4 C)] 98.7 F (37.1 C) (03/01 0521) Pulse Rate:  [78-97] 78 (03/01 0521) Resp:  [18-20] 20 (03/01 0521) BP: (145-168)/(52-66) 145/52 mmHg (03/01 0521) SpO2:  [92 %-96 %] 93 % (03/01 0521) Last BM Date: 06/26/13 (pt denies feeling constipated) General:   Alert,  Well-developed, well-nourished, pleasant and cooperative in NAD. A coming by her husband. Abdomen:  Obese. Bowel sounds are present. Abdomen is soft with minimal periumbilical tenderness. No obvious mass or organomegaly.  Extremities:  Without clubbing or edema.    Intake/Output from previous day: 02/28 0701 - 03/01 0700 In: 1588.5 [P.O.:596; I.V.:992.5] Out: 1760 [Urine:1760] Intake/Output this shift: Total I/O In: 1054 [P.O.:240; I.V.:714; IV Piggyback:100] Out: 1050 [Urine:1050]  Lab Results:  Recent Labs  06/28/13 0624 06/29/13 0602  WBC 14.8* 12.6*  HGB 10.5* 10.5*  HCT 32.8* 31.8*  PLT 346 377   BMET  Recent Labs  06/28/13 0624 06/29/13 0602 06/30/13 0510  NA 140 139 137  K 4.8 4.4 3.6*  CL 106 107 102  CO2 23 22 23   GLUCOSE 107* 98 84  BUN 16 16 16   CREATININE 2.25* 1.92* 1.74*  CALCIUM 8.6 8.8 8.4     Impression:  Overall, pancreatitis improving. Currently, on a clear liquid diet which she is tolerating well. Will hold off on trying to advance diet today; she'll be reassessed by Dr. Otelia Limes tomorrow morning. Agree with scheduled Zofran  Acute kidney injury-resolving

## 2013-06-30 NOTE — Progress Notes (Signed)
Subjective: She is having significantly more trouble with nausea. She was placed on a heart healthy diet yesterday and developed pretty severe abdominal pain after she ate. This morning she doesn't have much pain but does have severe nausea and is having difficulty with appetite.  Objective: Vital signs in last 24 hours: Temp:  [98.6 F (37 C)-99.4 F (37.4 C)] 98.7 F (37.1 C) (03/01 0521) Pulse Rate:  [78-97] 78 (03/01 0521) Resp:  [18-20] 20 (03/01 0521) BP: (145-168)/(52-66) 145/52 mmHg (03/01 0521) SpO2:  [92 %-96 %] 93 % (03/01 0521) Weight change:  Last BM Date: 06/26/13 (pt denies feeling constipated)  Intake/Output from previous day: 02/28 0701 - 03/01 0700 In: 1588.5 [P.O.:596; I.V.:992.5] Out: 1760 [Urine:1760]  PHYSICAL EXAM General appearance: alert, cooperative, mild distress and morbidly obese Resp: clear to auscultation bilaterally Cardio: regular rate and rhythm, S1, S2 normal, no murmur, click, rub or gallop GI: Minimally tender in the epigastric region Extremities: extremities normal, atraumatic, no cyanosis or edema  Lab Results:    Basic Metabolic Panel:  Recent Labs  06/28/13 0624 06/29/13 0559 06/29/13 0602 06/30/13 0510  NA 140  --  139 137  K 4.8  --  4.4 3.6*  CL 106  --  107 102  CO2 23  --  22 23  GLUCOSE 107*  --  98 84  BUN 16  --  16 16  CREATININE 2.25*  --  1.92* 1.74*  CALCIUM 8.6  --  8.8 8.4  PHOS  --  3.0  --   --    Liver Function Tests: No results found for this basename: AST, ALT, ALKPHOS, BILITOT, PROT, ALBUMIN,  in the last 72 hours  Recent Labs  06/28/13 0624  LIPASE 44  AMYLASE 44   No results found for this basename: AMMONIA,  in the last 72 hours CBC:  Recent Labs  06/28/13 0624 06/29/13 0602  WBC 14.8* 12.6*  HGB 10.5* 10.5*  HCT 32.8* 31.8*  MCV 98.2 96.7  PLT 346 377   Cardiac Enzymes: No results found for this basename: CKTOTAL, CKMB, CKMBINDEX, TROPONINI,  in the last 72 hours BNP: No results  found for this basename: PROBNP,  in the last 72 hours D-Dimer: No results found for this basename: DDIMER,  in the last 72 hours CBG: No results found for this basename: GLUCAP,  in the last 72 hours Hemoglobin A1C: No results found for this basename: HGBA1C,  in the last 72 hours Fasting Lipid Panel: No results found for this basename: CHOL, HDL, LDLCALC, TRIG, CHOLHDL, LDLDIRECT,  in the last 72 hours Thyroid Function Tests: No results found for this basename: TSH, T4TOTAL, FREET4, T3FREE, THYROIDAB,  in the last 72 hours Anemia Panel:  Recent Labs  06/29/13 0559  FERRITIN 229  TIBC 193*  IRON 18*   Coagulation: No results found for this basename: LABPROT, INR,  in the last 72 hours Urine Drug Screen: Drugs of Abuse  No results found for this basename: labopia, cocainscrnur, labbenz, amphetmu, thcu, labbarb    Alcohol Level: No results found for this basename: ETH,  in the last 72 hours Urinalysis:  Recent Labs  06/29/13 1115  COLORURINE YELLOW  LABSPEC >1.030*  PHURINE 5.0  GLUCOSEU NEGATIVE  HGBUR TRACE*  BILIRUBINUR NEGATIVE  KETONESUR NEGATIVE  PROTEINUR NEGATIVE  UROBILINOGEN 0.2  NITRITE NEGATIVE  LEUKOCYTESUR MODERATE*   Misc. Labs:  ABGS No results found for this basename: PHART, PCO2, PO2ART, TCO2, HCO3,  in the last 72 hours CULTURES  No results found for this or any previous visit (from the past 240 hour(s)). Studies/Results: No results found.  Medications:  Prior to Admission:  Prescriptions prior to admission  Medication Sig Dispense Refill  . amLODipine-benazepril (LOTREL) 5-20 MG per capsule Take 1 capsule by mouth daily.      Marland Kitchen aspirin EC 81 MG tablet Take 81 mg by mouth daily.      Marland Kitchen B-Complex TABS Take 1 tablet by mouth daily.      . Hydrocodone-Acetaminophen (VICODIN) 5-300 MG TABS Take 1 tablet by mouth 4 (four) times daily as needed. pain      . ondansetron (ZOFRAN) 4 MG tablet Take 1 tablet (4 mg total) by mouth every 8 (eight)  hours as needed for nausea or vomiting.  20 tablet  0  . Coenzyme Q10 (CO Q 10 PO) Take by mouth daily.       Marland Kitchen esomeprazole (NEXIUM) 40 MG capsule Take 40 mg by mouth at bedtime.       . Glucosamine-Chondroitin (COSAMIN DS PO) Take 1 tablet by mouth 2 (two) times daily.       . Multiple Vitamin (MULTIVITAMIN) tablet Take 1 tablet by mouth daily.       Scheduled: . amLODipine  5 mg Oral Daily  . enoxaparin (LOVENOX) injection  40 mg Subcutaneous Q24H  . feeding supplement (RESOURCE BREEZE)  1 Container Oral TID BM  . iron polysaccharides  150 mg Oral BID  . ondansetron (ZOFRAN) IV  8 mg Intravenous 4 times per day  . pantoprazole (PROTONIX) IV  40 mg Intravenous Q12H   Continuous: . sodium chloride 125 mL/hr at 06/30/13 0254   YHC:[id number], HYDROmorphone (DILAUDID) injection, ondansetron (ZOFRAN) IV, ondansetron, oxyCODONE, promethazine  Assesment: She was admitted with acute pancreatitis. She developed acute kidney injury and that seems to be better. As far as her pancreatitis is concerned she has less pain but is having significant difficulty with nausea and is not able to be without developing pain. Principal Problem:   Pancreatitis, acute Active Problems:   HTN (hypertension)   Obesity   Acute kidney injury    Plan: I'm going to put her on around-the-clock Zofran and increase the dose. Continue other treatments    LOS: 5 days   Mylik Pro L 06/30/2013, 10:12 AM

## 2013-06-30 NOTE — Progress Notes (Signed)
Subjective: Interval History: she complains of some nausea but no vomiting. She has also mild abdominal discomfort.   Objective: Vital signs in last 24 hours: Temp:  [98.6 F (37 C)-99.4 F (37.4 C)] 98.7 F (37.1 C) (03/01 0521) Pulse Rate:  [78-97] 78 (03/01 0521) Resp:  [18-20] 20 (03/01 0521) BP: (145-168)/(52-66) 145/52 mmHg (03/01 0521) SpO2:  [92 %-96 %] 93 % (03/01 0521) Weight change:   Intake/Output from previous day: 02/28 0701 - 03/01 0700 In: 1588.5 [P.O.:596; I.V.:992.5] Out: 1760 [Urine:1760] Intake/Output this shift: Total I/O In: 50 [IV Piggyback:50] Out: 350 [Urine:350]  General appearance: alert, cooperative and no distress Resp: clear to auscultation bilaterally Cardio: regular rate and rhythm, S1, S2 normal, no murmur, click, rub or gallop GI: soft, non-tender; bowel sounds normal; no masses,  no organomegaly Extremities: extremities normal, atraumatic, no cyanosis or edema  Lab Results:  Recent Labs  06/28/13 0624 06/29/13 0602  WBC 14.8* 12.6*  HGB 10.5* 10.5*  HCT 32.8* 31.8*  PLT 346 377   BMET:   Recent Labs  06/29/13 0602 06/30/13 0510  NA 139 137  K 4.4 3.6*  CL 107 102  CO2 22 23  GLUCOSE 98 84  BUN 16 16  CREATININE 1.92* 1.74*  CALCIUM 8.8 8.4   No results found for this basename: PTH,  in the last 72 hours Iron Studies:   Recent Labs  06/29/13 0559  IRON 18*  TIBC 193*  FERRITIN 229    Studies/Results: No results found.  I have reviewed the patient's current medications.  Assessment/Plan: Problem #1 acute kidney injury her BUN is 16 and creatinine is 1.74  renal function seems to be improving. Presently patient is none oliguric. Problem #2 anemia: Her hemoglobin is 10.5 hematocrit 31.8 stable. Problem #3 hypertension her blood pressure seems to be reasonably controlled Problem #4 pancreatitis. Her lipase and amylase has normalized but patient with some abdominal discomfort and nausea. GERD? Problem #5  obesity  Problem #6 metabolic bone disease her calcium and phosphorus is range. Problem #7 anemia: Her hemoglobin and hematocrit is stable. Presently had a saturation is 9% low but ferritin is 229. Most likely an of iron deficiency. Plan: We'll continue his present management We'll check  basic metabolic panel. Will start patient on Nu-Iron 150 mg by mouth once a day.   LOS: 5 days   Loriann Bosserman S 06/30/2013,9:23 AM

## 2013-06-30 NOTE — Plan of Care (Signed)
Problem: Phase II Progression Outcomes Goal: Progress activity as tolerated unless otherwise ordered Outcome: Progressing 06/30/13 1055 Patient ambulates in room, up to chair this morning. Tolerated sitting up with no complaints. Husband at bedside, assists with care. Instructed patient to call for assistance as needed. Call light kept within reach. Donavan Foil, RN

## 2013-07-01 LAB — BASIC METABOLIC PANEL
BUN: 14 mg/dL (ref 6–23)
CO2: 24 mEq/L (ref 19–32)
Calcium: 8.5 mg/dL (ref 8.4–10.5)
Chloride: 101 mEq/L (ref 96–112)
Creatinine, Ser: 1.55 mg/dL — ABNORMAL HIGH (ref 0.50–1.10)
GFR calc non Af Amer: 35 mL/min — ABNORMAL LOW (ref >90)
GFR, EST AFRICAN AMERICAN: 40 mL/min — AB (ref >90)
Glucose, Bld: 87 mg/dL (ref 70–99)
POTASSIUM: 3 meq/L — AB (ref 3.7–5.3)
SODIUM: 138 meq/L (ref 137–147)

## 2013-07-01 LAB — MISCELLANEOUS TEST

## 2013-07-01 LAB — LIPASE, BLOOD: Lipase: 31 U/L (ref 11–59)

## 2013-07-01 MED ORDER — POTASSIUM CHLORIDE CRYS ER 20 MEQ PO TBCR
40.0000 meq | EXTENDED_RELEASE_TABLET | Freq: Once | ORAL | Status: AC
  Administered 2013-07-01: 40 meq via ORAL
  Filled 2013-07-01: qty 2

## 2013-07-01 MED ORDER — AMLODIPINE BESYLATE 5 MG PO TABS: 10.0000 mg | ORAL_TABLET | Freq: Every day | ORAL | Status: DC

## 2013-07-01 NOTE — Progress Notes (Signed)
Subjective: She says she's much better. She doesn't have any nausea. Her abdominal pain is okay. She wants to try food with more substance.  Objective: Vital signs in last 24 hours: Temp:  [97.8 F (36.6 C)-98.3 F (36.8 C)] 98.3 F (36.8 C) (03/02 0444) Pulse Rate:  [62-78] 62 (03/02 0444) Resp:  [20] 20 (03/02 0444) BP: (151-187)/(54-69) 187/69 mmHg (03/02 0444) SpO2:  [95 %-96 %] 95 % (03/02 0444) Weight change:  Last BM Date: 06/26/13  Intake/Output from previous day: 03/01 0701 - 03/02 0700 In: 1807.9 [P.O.:596; I.V.:1061.9; IV Piggyback:150] Out: 1950 [Urine:1950]  PHYSICAL EXAM General appearance: alert, cooperative and no distress Resp: clear to auscultation bilaterally Cardio: regular rate and rhythm, S1, S2 normal, no murmur, click, rub or gallop GI: soft, non-tender; bowel sounds normal; no masses,  no organomegaly Extremities: extremities normal, atraumatic, no cyanosis or edema  Lab Results:    Basic Metabolic Panel:  Recent Labs  06/29/13 0559  06/30/13 0510 07/01/13 0549  NA  --   < > 137 138  K  --   < > 3.6* 3.0*  CL  --   < > 102 101  CO2  --   < > 23 24  GLUCOSE  --   < > 84 87  BUN  --   < > 16 14  CREATININE  --   < > 1.74* 1.55*  CALCIUM  --   < > 8.4 8.5  PHOS 3.0  --   --   --   < > = values in this interval not displayed. Liver Function Tests: No results found for this basename: AST, ALT, ALKPHOS, BILITOT, PROT, ALBUMIN,  in the last 72 hours  Recent Labs  07/01/13 0549  LIPASE 31   No results found for this basename: AMMONIA,  in the last 72 hours CBC:  Recent Labs  06/29/13 0602  WBC 12.6*  HGB 10.5*  HCT 31.8*  MCV 96.7  PLT 377   Cardiac Enzymes: No results found for this basename: CKTOTAL, CKMB, CKMBINDEX, TROPONINI,  in the last 72 hours BNP: No results found for this basename: PROBNP,  in the last 72 hours D-Dimer: No results found for this basename: DDIMER,  in the last 72 hours CBG: No results found for this  basename: GLUCAP,  in the last 72 hours Hemoglobin A1C: No results found for this basename: HGBA1C,  in the last 72 hours Fasting Lipid Panel: No results found for this basename: CHOL, HDL, LDLCALC, TRIG, CHOLHDL, LDLDIRECT,  in the last 72 hours Thyroid Function Tests: No results found for this basename: TSH, T4TOTAL, FREET4, T3FREE, THYROIDAB,  in the last 72 hours Anemia Panel:  Recent Labs  06/29/13 0559  FERRITIN 229  TIBC 193*  IRON 18*   Coagulation: No results found for this basename: LABPROT, INR,  in the last 72 hours Urine Drug Screen: Drugs of Abuse  No results found for this basename: labopia, cocainscrnur, labbenz, amphetmu, thcu, labbarb    Alcohol Level: No results found for this basename: ETH,  in the last 72 hours Urinalysis:  Recent Labs  06/29/13 1115  COLORURINE YELLOW  LABSPEC >1.030*  PHURINE 5.0  GLUCOSEU NEGATIVE  HGBUR TRACE*  BILIRUBINUR NEGATIVE  KETONESUR NEGATIVE  PROTEINUR NEGATIVE  UROBILINOGEN 0.2  NITRITE NEGATIVE  LEUKOCYTESUR MODERATE*   Misc. Labs:  ABGS No results found for this basename: PHART, PCO2, PO2ART, TCO2, HCO3,  in the last 72 hours CULTURES Recent Results (from the past 240 hour(s))  URINE CULTURE     Status: None   Collection Time    06/29/13 11:15 AM      Result Value Ref Range Status   Specimen Description URINE, CLEAN CATCH   Final   Special Requests Normal   Final   Culture  Setup Time     Final   Value: 06/29/2013 22:47     Performed at [facility]     Final   Value: NO GROWTH     Performed at [facility]   Culture     Final   Value: NO GROWTH     Performed at [facility]   Report Status 06/30/2013 FINAL   Final   Studies/Results: No results found.  Medications:  Prior to Admission:  Prescriptions prior to admission  Medication Sig Dispense Refill  . amLODipine-benazepril (LOTREL) 5-20 MG per capsule Take 1 capsule by mouth daily.      Marland Kitchen aspirin EC  81 MG tablet Take 81 mg by mouth daily.      Marland Kitchen B-Complex TABS Take 1 tablet by mouth daily.      . Hydrocodone-Acetaminophen (VICODIN) 5-300 MG TABS Take 1 tablet by mouth 4 (four) times daily as needed. pain      . ondansetron (ZOFRAN) 4 MG tablet Take 1 tablet (4 mg total) by mouth every 8 (eight) hours as needed for nausea or vomiting.  20 tablet  0  . Coenzyme Q10 (CO Q 10 PO) Take by mouth daily.       Marland Kitchen esomeprazole (NEXIUM) 40 MG capsule Take 40 mg by mouth at bedtime.       . Glucosamine-Chondroitin (COSAMIN DS PO) Take 1 tablet by mouth 2 (two) times daily.       . Multiple Vitamin (MULTIVITAMIN) tablet Take 1 tablet by mouth daily.       Scheduled: . amLODipine  5 mg Oral Daily  . enoxaparin (LOVENOX) injection  40 mg Subcutaneous Q24H  . feeding supplement (RESOURCE BREEZE)  1 Container Oral TID BM  . iron polysaccharides  150 mg Oral BID  . ondansetron (ZOFRAN) IV  8 mg Intravenous 4 times per day  . pantoprazole (PROTONIX) IV  40 mg Intravenous Q12H  . potassium chloride  40 mEq Oral Once   Continuous: . sodium chloride 125 mL/hr at 06/30/13 1516   PNT:[id number], HYDROmorphone (DILAUDID) injection, ondansetron (ZOFRAN) IV, ondansetron, oxyCODONE, promethazine  Assesment: She has acute pancreatitis. She had acute kidney injury probably multifactorial. Her potassium is low this morning but she feels much better. Principal Problem:   Pancreatitis, acute Active Problems:   HTN (hypertension)   Obesity   Acute kidney injury    Plan: Advance her diet. Replace her potassium. She may be able to go home later today    LOS: 6 days   Kamorie Aldous L 07/01/2013, 9:05 AM

## 2013-07-01 NOTE — Progress Notes (Signed)
Pt consumed 100% of food ordered.  Checked on pt frequently over the course of one hour and no c/o of pain or nausea.  Per Dr Luan Pulling request called him at the office to let him know the status of the pt's condition.

## 2013-07-01 NOTE — Progress Notes (Signed)
Subjective: Interval History: Patient states that she's feeling much better today. She doesn't have any nausea no vomiting and no abdominal pain.   Objective: Vital signs in last 24 hours: Temp:  [97.8 F (36.6 C)-98.3 F (36.8 C)] 98.3 F (36.8 C) (03/02 0444) Pulse Rate:  [62-78] 62 (03/02 0444) Resp:  [20] 20 (03/02 0444) BP: (151-187)/(54-69) 187/69 mmHg (03/02 0444) SpO2:  [95 %-96 %] 95 % (03/02 0444) Weight change:   Intake/Output from previous day: 03/01 0701 - 03/02 0700 In: 1807.9 [P.O.:596; I.V.:1061.9; IV Piggyback:150] Out: 1950 [Urine:1950] Intake/Output this shift:    General appearance: alert, cooperative and no distress Resp: clear to auscultation bilaterally Cardio: regular rate and rhythm, S1, S2 normal, no murmur, click, rub or gallop GI: soft, non-tender; bowel sounds normal; no masses,  no organomegaly Extremities: extremities normal, atraumatic, no cyanosis or edema  Lab Results:  Recent Labs  06/29/13 0602  WBC 12.6*  HGB 10.5*  HCT 31.8*  PLT 377   BMET:   Recent Labs  06/30/13 0510 07/01/13 0549  NA 137 138  K 3.6* 3.0*  CL 102 101  CO2 23 24  GLUCOSE 84 87  BUN 16 14  CREATININE 1.74* 1.55*  CALCIUM 8.4 8.5   No results found for this basename: PTH,  in the last 72 hours Iron Studies:   Recent Labs  06/29/13 0559  IRON 18*  TIBC 193*  FERRITIN 229    Studies/Results: No results found.  I have reviewed the patient's current medications.  Assessment/Plan: Problem #1 acute kidney injury her BUN is 14 and creatinine is 1.55  renal function is improving. Presently patient is none oliguric. Problem #2 anemia: Her hemoglobin is 10.5 hematocrit 31.8 stable. Patient started on Nu-Iron. Problem #3 hypertension her blood pressure seems to be high. She is on amlodipine 5 mg once a day. Problem #4 pancreatitis. Her lipase and amylase has normalized. Patient is a symptomatic today. Problem #5 obesity  Problem #6 metabolic bone  disease her calcium and phosphorus is range. Problem #7 hypokalemia: Potassium is low today. Plan: We'll increase amlodipine to 10 mg once a day We'll check  basic metabolic panel. Agree with potassium supplement..   LOS: 6 days   Adia Crammer S 07/01/2013,9:25 AM

## 2013-07-02 ENCOUNTER — Telehealth: Payer: Self-pay

## 2013-07-02 ENCOUNTER — Other Ambulatory Visit: Payer: Self-pay

## 2013-07-02 DIAGNOSIS — K859 Acute pancreatitis without necrosis or infection, unspecified: Secondary | ICD-10-CM

## 2013-07-02 NOTE — Telephone Encounter (Signed)
Message copied by Barron Alvine on Tue Jul 02, 2013  9:34 AM ------      Message from: Milus Banister      Created: Tue Jul 02, 2013  9:11 AM      Regarding: RE: EUS       Ok,      She needs upper EUS, radial +/- linear, in about 4-5 weeks for recurrent acute pancreatitis. +MAC.  Thanks                  ----- Message -----         From: Barron Alvine, CMA         Sent: 07/02/2013   8:04 AM           To: Milus Banister, MD      Subject: Melton Alar: EUS                                                              ----- Message -----         From: Worthy Keeler         Sent: 07/01/2013   3:32 PM           To: Barron Alvine, CMA      Subject: EUS                                                      Patient needs EUS scheduled for end of March. She has pancreatitis.      Thanks        ------

## 2013-07-02 NOTE — Telephone Encounter (Signed)
EUS scheduled, pt instructed and medications reviewed.  Patient instructions mailed to home.  Patient to call with any questions or concerns.  

## 2013-07-03 ENCOUNTER — Encounter: Payer: Self-pay | Admitting: Gastroenterology

## 2013-07-09 NOTE — Discharge Summary (Signed)
Physician Discharge Summary  Patient ID: Deanna [name] MRN: [id number] DOB/AGE: [birth year] 63 y.o. Primary Care Physician:Berley Gambrell L, MD Admit date: 06/25/2013 Discharge date: 07/09/2013    Discharge Diagnoses:   Principal Problem:   Pancreatitis, acute Active Problems:   HTN (hypertension)   Obesity   Acute kidney injury     Medication List         amLODipine-benazepril 5-20 MG per capsule  Commonly known as:  LOTREL  Take 1 capsule by mouth daily.     aspirin EC 81 MG tablet  Take 81 mg by mouth daily.     B-Complex Tabs  Take 1 tablet by mouth daily.     CO Q 10 PO  Take by mouth daily.     COSAMIN DS PO  Take 1 tablet by mouth 2 (two) times daily.     esomeprazole 40 MG capsule  Commonly known as:  NEXIUM  Take 40 mg by mouth at bedtime.     multivitamin tablet  Take 1 tablet by mouth daily.     ondansetron 4 MG tablet  Commonly known as:  ZOFRAN  Take 1 tablet (4 mg total) by mouth every 8 (eight) hours as needed for nausea or vomiting.     VICODIN 5-300 MG Tabs  Generic drug:  Hydrocodone-Acetaminophen  Take 1 tablet by mouth 4 (four) times daily as needed. pain        Discharged Condition: Improved    Consults: GI/nephrology  Significant Diagnostic Studies: US Abdomen Complete  06/26/2013   CLINICAL DATA:  Acute pancreatitis.  EXAM: ULTRASOUND ABDOMEN COMPLETE  COMPARISON:  CT scan dated 06/25/2013  FINDINGS: Gallbladder:  No gallstones or wall thickening visualized. No sonographic Murphy sign noted.  Common bile duct:  Diameter: 3.4 mm, normal.  Liver:  Slight hepatomegaly. Echogenic liver parenchyma consistent with hepatic steatosis. Prominent left lobe. No focal lesions.  IVC:  Normal.  Pancreas:  The pancreas is prominent without focal lesions. No cysts or detectable peripancreatic fluid collections.  Spleen:  Normal.  3.9 cm.  Right Kidney:  Length: 11.0 cm. Echogenicity within normal limits. No mass or hydronephrosis visualized.  Left  Kidney:  Length: 10.8 cm. Echogenicity within normal limits. No mass or hydronephrosis visualized.  Abdominal aorta:  Normal.  2.9 cm maximum diameter.  Other findings:  None.  IMPRESSION: Diffusely prominent pancreas. Hepatomegaly with hepatic steatosis. Normal biliary tree.   Electronically Signed   By: Rozetta Nunnery M.D.   On: 06/26/2013 09:09   Ct Abdomen Pelvis W Contrast  06/25/2013   CLINICAL DATA:  Abdominal pain  EXAM: CT ABDOMEN AND PELVIS WITH CONTRAST  TECHNIQUE: Multidetector CT imaging of the abdomen and pelvis was performed using the standard protocol following bolus administration of intravenous contrast.  CONTRAST:  18mL OMNIPAQUE IOHEXOL 300 MG/ML  SOLN  COMPARISON:  CT ABD/PELVIS W CM dated 12/10/2010  FINDINGS: There is stranding can an about the pancreatic head and body. This is compatible with inflammatory change. No acute fluid collection or pseudocyst. No hemorrhage. No ductal dilatation.  Unremarkable gallbladder, spleen, adrenal glands, and kidneys.  Tiny hypodensity in the right lobe of the liver on image 26 is stable. Diffuse hepatic steatosis.  Large hiatal hernia is stable.  Normal appendix. Sigmoid diverticulosis. Bladder, uterus, and adnexa are unremarkable.  No free fluid.  No vertebral compression deformity. Advanced degenerative disc disease in the lower lumbar spine.  IMPRESSION: Inflammatory changes of the pancreas are compatible with acute pancreatitis.   Electronically Signed  By: Maryclare Bean M.D.   On: 06/25/2013 15:10    Lab Results: Basic Metabolic Panel: No results found for this basename: NA, K, CL, CO2, GLUCOSE, BUN, CREATININE, CALCIUM, MG, PHOS,  in the last 72 hours Liver Function Tests: No results found for this basename: AST, ALT, ALKPHOS, BILITOT, PROT, ALBUMIN,  in the last 72 hours   CBC: No results found for this basename: WBC, NEUTROABS, HGB, HCT, MCV, PLT,  in the last 72 hours  Recent Results (from the past 240 hour(s))  URINE CULTURE      Status: None   Collection Time    06/29/13 11:15 AM      Result Value Ref Range Status   Specimen Description URINE, CLEAN CATCH   Final   Special Requests Normal   Final   Culture  Setup Time     Final   Value: 06/29/2013 22:47     Performed at [facility]     Final   Value: NO GROWTH     Performed at [facility]   Culture     Final   Value: NO GROWTH     Performed at [facility]   Report Status 06/30/2013 FINAL   Final     Hospital Course: This is a 63 year old who had seen Dr. Laural Golden in his office and was discovered to have pancreatitis. It was initially thought that she might be able to be treated as an outpatient but she had more problems with pain. She was brought to the hospital for pain control. She had difficulty with continued nausea and continued abdominal pain. She had CT the abdomen with contrast and had what appeared to be acute kidney injury related to that to her pancreatitis and 2 Baseline hypertension. Over the next several days she got better as far as her abdominal pain was concerned she was able to manage full liquid diet and her nausea improved. Her renal function had improved but had not returned to baseline by the time of discharge. She was feeling much better and able to manage pain on oral medications  Discharge Exam: Blood pressure 187/69, pulse 62, temperature 98.3 F (36.8 C), temperature source Oral, resp. rate 20, height 5\' 7"  (1.702 m), weight 117.935 kg (260 lb), SpO2 95.00%. She still has mild abdominal tenderness. Her chest is clear. Her heart is regular.  Disposition: Home she will followup in my office with Dr. Laural Golden and nephrology      Discharge Orders   Future Appointments Provider Department Dept Phone   07/29/2013 11:00 AM Rogene Houston, MD [place] [phone]   Future Orders Complete By Expires   Discharge patient  As directed       Follow-up Information   Follow up  with [facility] S, MD In 4 weeks.   Specialty:  Nephrology   Contact information:   [address] [address] [phone]       Signed: Alonza Bogus   07/09/2013, 7:39 AM

## 2013-07-10 ENCOUNTER — Encounter (HOSPITAL_COMMUNITY): Payer: Self-pay | Admitting: Pharmacy Technician

## 2013-07-11 ENCOUNTER — Telehealth: Payer: Self-pay

## 2013-07-11 NOTE — Telephone Encounter (Signed)
Patient called stating she is scheduled for a EUS on 08/15/13 but the hospital called her about her appt and states it is on 08/01/13. Pt wants to clarify if her paperwork is right and what day is she supposed to have the EUS. Told patient that she is scheduled for 08/01/13 according to our system but would like Patty to clarify and call her tomorrow if that is ok. Patient states that is fine.

## 2013-07-12 ENCOUNTER — Encounter (HOSPITAL_COMMUNITY): Payer: Self-pay | Admitting: *Deleted

## 2013-07-12 NOTE — Telephone Encounter (Signed)
Pt paper work is incorrect she is scheduled for 08/01/13 she is aware and will adjust her paperwork

## 2013-07-23 ENCOUNTER — Encounter (HOSPITAL_COMMUNITY): Payer: Self-pay | Admitting: *Deleted

## 2013-07-23 ENCOUNTER — Telehealth: Payer: Self-pay

## 2013-07-23 NOTE — Telephone Encounter (Signed)
Pt appt is 08/15/13 930 am she is aware and I apologized for the mix up.  Pt thanked me for calling

## 2013-07-29 ENCOUNTER — Ambulatory Visit (INDEPENDENT_AMBULATORY_CARE_PROVIDER_SITE_OTHER): Payer: BC Managed Care – PPO | Admitting: Internal Medicine

## 2013-07-29 ENCOUNTER — Encounter (INDEPENDENT_AMBULATORY_CARE_PROVIDER_SITE_OTHER): Payer: Self-pay | Admitting: Internal Medicine

## 2013-07-29 VITALS — BP 138/84 | HR 80 | Temp 98.8°F | Resp 18 | Ht 67.0 in | Wt 249.4 lb

## 2013-07-29 DIAGNOSIS — Z8719 Personal history of other diseases of the digestive system: Secondary | ICD-10-CM

## 2013-07-29 DIAGNOSIS — R110 Nausea: Secondary | ICD-10-CM

## 2013-07-29 DIAGNOSIS — K5900 Constipation, unspecified: Secondary | ICD-10-CM

## 2013-07-29 MED ORDER — ONDANSETRON HCL 4 MG PO TABS: 4.0000 mg | ORAL_TABLET | Freq: Three times a day (TID) | ORAL | Status: DC | PRN

## 2013-07-29 MED ORDER — POLYETHYLENE GLYCOL 3350 17 G PO PACK: 17.0000 g | PACK | Freq: Every day | ORAL | Status: DC

## 2013-07-29 NOTE — Patient Instructions (Signed)
Notify if abdominal pain recurs. 

## 2013-07-29 NOTE — Progress Notes (Signed)
Presenting complaint;  Followup of recent hospitalization for pancreatitis.  Subjective:  Patient is 62-year-old Caucasian female who was hospitalized on 06/25/2013 following office visit when she was diagnosed with pancreatitis. She has had one episode in the past. Hospitalization was marked by acute renal injury possibly do to IV contrast. No cause of her pancreatitis was found. She is scheduled for EUS in 2 weeks with Dr. Jacobs. She continues to complain of intermittent nausea. She needs ondansetron refill. She denies vomiting or abdominal pain. She states heartburn well-controlled with PPI. She remains on bland diet. She has lost 13 pounds since her last visit over a month ago.  Current Medications: Outpatient Encounter Prescriptions as of 07/29/2013  Medication Sig  . amLODipine-benazepril (LOTREL) 5-20 MG per capsule Take 1 capsule by mouth every morning.   . aspirin EC 81 MG tablet Take 81 mg by mouth every Monday, Wednesday, and Friday.   . B Complex-C (B-COMPLEX WITH VITAMIN C) tablet Take 1 tablet by mouth daily.  . calcium-vitamin D (OSCAL WITH D) 500-200 MG-UNIT per tablet Take 1 tablet by mouth daily with breakfast.  . Coenzyme Q10 (CO Q 10 PO) Take 200 mg by mouth daily.   . CRANBERRY PO Take 1 tablet by mouth daily.  . diphenhydrAMINE (BENADRYL) 25 MG tablet Take 25 mg by mouth at bedtime.  . esomeprazole (NEXIUM) 40 MG capsule Take 40 mg by mouth at bedtime.   . Glucosamine-Chondroitin (COSAMIN DS PO) Take 1 tablet by mouth 2 (two) times daily.   . Krill Oil 300 MG CAPS Take 300 mg by mouth daily.  . Multiple Vitamin (MULTIVITAMIN) tablet Take 1 tablet by mouth daily.  . ondansetron (ZOFRAN) 4 MG tablet Take 4 mg by mouth every 8 (eight) hours as needed for nausea or vomiting.  . vitamin C (ASCORBIC ACID) 500 MG tablet Take 500 mg by mouth daily.     Objective: Blood pressure 138/84, pulse 80, temperature 98.8 F (37.1 C), temperature source Oral, resp. rate 18, height  5' 7" (1.702 m), weight 249 lb 6.4 oz (113.127 kg). Patient is alert and in no acute distress. Conjunctiva is pink. Sclera is nonicteric Oropharyngeal mucosa is normal. No neck masses or thyromegaly noted. Cardiac exam with regular rhythm normal S1 and S2. No murmur or gallop noted. Lungs are clear to auscultation. Abdomen is full but soft and nontender without organomegaly or masses.  No LE edema or clubbing noted.    Assessment:  #1.  Nausea possibly related to recent bout of pancreatitis. We'll continue symptomatic therapy for now. #2. Recent bout with acute pancreatitis. First episode was in June 2012. Etiology unclear. She is therefore scheduled to undergo EUS next month. #3. GERD. Heartburn well-controlled with PPI. #4. Constipation possibly related to dietary changes. #5. History of acute renal injury most likely secondary to IV contrast. Renal function was getting better at the time of discharge. She is to have labs next week prior to office visit with Dr. Befakadu.  Plan:  New prescription for ondansetron given; dose is 4 mg by mouth 3 times a day when necessary 30 without refill. Polyethylene glycol 8.5-17 g by mouth daily. Increase intake of fiber rich foods. Pancreatic EUS in 2 weeks as planned. Office visit in 3 months.       

## 2013-08-15 ENCOUNTER — Encounter (HOSPITAL_COMMUNITY)
Admission: RE | Disposition: A | Discharge status: Home / Self Care | Payer: Self-pay | Source: Ambulatory Visit | Attending: Gastroenterology

## 2013-08-15 ENCOUNTER — Ambulatory Visit (HOSPITAL_COMMUNITY)
Admission: RE | Admit: 2013-08-15 | Discharge: 2013-08-15 | Disposition: A | Discharge status: Home / Self Care | Payer: BC Managed Care – PPO | Source: Ambulatory Visit | Attending: Gastroenterology | Admitting: Gastroenterology

## 2013-08-15 ENCOUNTER — Encounter (HOSPITAL_COMMUNITY): Payer: BC Managed Care – PPO | Admitting: Anesthesiology

## 2013-08-15 ENCOUNTER — Encounter (HOSPITAL_COMMUNITY): Payer: Self-pay | Admitting: *Deleted

## 2013-08-15 ENCOUNTER — Ambulatory Visit (HOSPITAL_COMMUNITY): Payer: BC Managed Care – PPO | Admitting: Anesthesiology

## 2013-08-15 DIAGNOSIS — K859 Acute pancreatitis without necrosis or infection, unspecified: Secondary | ICD-10-CM

## 2013-08-15 DIAGNOSIS — Z79899 Other long term (current) drug therapy: Secondary | ICD-10-CM | POA: Insufficient documentation

## 2013-08-15 DIAGNOSIS — Z87448 Personal history of other diseases of urinary system: Secondary | ICD-10-CM | POA: Insufficient documentation

## 2013-08-15 DIAGNOSIS — K861 Other chronic pancreatitis: Secondary | ICD-10-CM

## 2013-08-15 DIAGNOSIS — R110 Nausea: Secondary | ICD-10-CM | POA: Insufficient documentation

## 2013-08-15 DIAGNOSIS — Z7982 Long term (current) use of aspirin: Secondary | ICD-10-CM | POA: Insufficient documentation

## 2013-08-15 DIAGNOSIS — R933 Abnormal findings on diagnostic imaging of other parts of digestive tract: Secondary | ICD-10-CM

## 2013-08-15 DIAGNOSIS — K5900 Constipation, unspecified: Secondary | ICD-10-CM | POA: Insufficient documentation

## 2013-08-15 DIAGNOSIS — K219 Gastro-esophageal reflux disease without esophagitis: Secondary | ICD-10-CM | POA: Insufficient documentation

## 2013-08-15 HISTORY — PX: EUS: SHX5427

## 2013-08-15 HISTORY — DX: Personal history of other diseases of the digestive system: Z87.19

## 2013-08-15 HISTORY — DX: Gastro-esophageal reflux disease without esophagitis: K21.9

## 2013-08-15 SURGERY — UPPER ENDOSCOPIC ULTRASOUND (EUS) LINEAR
Anesthesia: Monitor Anesthesia Care

## 2013-08-15 MED ORDER — LACTATED RINGERS IV SOLN
INTRAVENOUS | Status: DC
  Administered 2013-08-15: 1000 mL via INTRAVENOUS

## 2013-08-15 MED ORDER — PROPOFOL 10 MG/ML IV BOLUS
INTRAVENOUS | Status: AC
  Filled 2013-08-15: qty 20

## 2013-08-15 MED ORDER — MIDAZOLAM HCL 2 MG/2ML IJ SOLN
INTRAMUSCULAR | Status: AC
  Filled 2013-08-15: qty 2

## 2013-08-15 MED ORDER — PROPOFOL INFUSION 10 MG/ML OPTIME
INTRAVENOUS | Status: DC | PRN
  Administered 2013-08-15: 70 ug/kg/min via INTRAVENOUS

## 2013-08-15 MED ORDER — KETAMINE HCL 10 MG/ML IJ SOLN
INTRAMUSCULAR | Status: DC | PRN
  Administered 2013-08-15: 20 mg via INTRAVENOUS

## 2013-08-15 MED ORDER — SODIUM CHLORIDE 0.9 % IV SOLN: INTRAVENOUS | Status: DC

## 2013-08-15 MED ORDER — MIDAZOLAM HCL 5 MG/5ML IJ SOLN
INTRAMUSCULAR | Status: DC | PRN
  Administered 2013-08-15: 2 mg via INTRAVENOUS

## 2013-08-15 NOTE — H&P (View-Only) (Signed)
Presenting complaint;  Followup of recent hospitalization for pancreatitis.  Subjective:  Patient is 63 year old Caucasian female who was hospitalized on 06/25/2013 following office visit when she was diagnosed with pancreatitis. She has had one episode in the past. Hospitalization was marked by acute renal injury possibly do to IV contrast. No cause of her pancreatitis was found. She is scheduled for EUS in 2 weeks with Dr. Ardis Hughs. She continues to complain of intermittent nausea. She needs ondansetron refill. She denies vomiting or abdominal pain. She states heartburn well-controlled with PPI. She remains on bland diet. She has lost 13 pounds since her last visit over a month ago.  Current Medications: Outpatient Encounter Prescriptions as of 07/29/2013  Medication Sig  . amLODipine-benazepril (LOTREL) 5-20 MG per capsule Take 1 capsule by mouth every morning.   Marland Kitchen aspirin EC 81 MG tablet Take 81 mg by mouth every Monday, Wednesday, and Friday.   . B Complex-C (B-COMPLEX WITH VITAMIN C) tablet Take 1 tablet by mouth daily.  . calcium-vitamin D (OSCAL WITH D) 500-200 MG-UNIT per tablet Take 1 tablet by mouth daily with breakfast.  . Coenzyme Q10 (CO Q 10 PO) Take 200 mg by mouth daily.   Marland Kitchen CRANBERRY PO Take 1 tablet by mouth daily.  . diphenhydrAMINE (BENADRYL) 25 MG tablet Take 25 mg by mouth at bedtime.  Marland Kitchen esomeprazole (NEXIUM) 40 MG capsule Take 40 mg by mouth at bedtime.   . Glucosamine-Chondroitin (COSAMIN DS PO) Take 1 tablet by mouth 2 (two) times daily.   Javier Docker Oil 300 MG CAPS Take 300 mg by mouth daily.  . Multiple Vitamin (MULTIVITAMIN) tablet Take 1 tablet by mouth daily.  . ondansetron (ZOFRAN) 4 MG tablet Take 4 mg by mouth every 8 (eight) hours as needed for nausea or vomiting.  . vitamin C (ASCORBIC ACID) 500 MG tablet Take 500 mg by mouth daily.     Objective: Blood pressure 138/84, pulse 80, temperature 98.8 F (37.1 C), temperature source Oral, resp. rate 18, height  5\' 7"  (1.702 m), weight 249 lb 6.4 oz (113.127 kg). Patient is alert and in no acute distress. Conjunctiva is pink. Sclera is nonicteric Oropharyngeal mucosa is normal. No neck masses or thyromegaly noted. Cardiac exam with regular rhythm normal S1 and S2. No murmur or gallop noted. Lungs are clear to auscultation. Abdomen is full but soft and nontender without organomegaly or masses.  No LE edema or clubbing noted.    Assessment:  #1.  Nausea possibly related to recent bout of pancreatitis. We'll continue symptomatic therapy for now. #2. Recent bout with acute pancreatitis. First episode was in June 2012. Etiology unclear. She is therefore scheduled to undergo EUS next month. #3. GERD. Heartburn well-controlled with PPI. #4. Constipation possibly related to dietary changes. #5. History of acute renal injury most likely secondary to IV contrast. Renal function was getting better at the time of discharge. She is to have labs next week prior to office visit with Dr. Hinda Lenis.  Plan:  New prescription for ondansetron given; dose is 4 mg by mouth 3 times a day when necessary 30 without refill. Polyethylene glycol 8.5-17 g by mouth daily. Increase intake of fiber rich foods. Pancreatic EUS in 2 weeks as planned. Office visit in 3 months.

## 2013-08-15 NOTE — Op Note (Signed)
[facility] ([place]) [place], [id number]   ENDOSCOPIC ULTRASOUND PROCEDURE REPORT  PATIENT: Deanna [name], Deanna [name]  MR#: [id number] BIRTHDATE: [birth year]  GENDER: Female ENDOSCOPIST: Milus Banister, MD REFERRED BY:  Hildred Laser, M.D. 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 MEDICATIONS: MAC sedation, administered by CRNA  DESCRIPTION OF PROCEDURE:   After the risks benefits and alternatives of the procedure were  explained, informed consent was obtained. The patient was then placed in the left, lateral, decubitus postion and IV sedation was administered. Throughout the procedure, the patients blood pressure, pulse and oxygen saturations were monitored continuously.  Under direct visualization, the Pentax EUS Radial [id number]  endoscope was introduced through the mouth  and advanced to the descending duodenum .  Water was used as necessary to provide an acoustic interface.  Upon completion of the imaging, water was removed and the patient was sent to the recovery room in satisfactory condition.  Endoscopic findings (limited exam with radial and linear echoendoscopes): 1 Normal UGI tract  EUS findings: 1. There was a single 61mm anechoic cyst along anterior body of pancreas. 2. There appears to be pancreatic divisim anatomy. 3. There was a 1.7cm, centrally fibrotic or calcified soft tissue lesion that appears to be in tail of pancreas or at least adjacent to the tail of pancreas (peripancreatic lymphnode?).  This lays directly behind the splenic vein from EUS vantage and was therefore not amenable to FNA. 4. Gallbladder was normal. 5. CBD was normal without dilation or  stones.  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 There is a 1.7cm centrally fibrotic or calcified soft tissue lesion in or near the tail of pancreas.  This could represent focal edema from recent pancreatitis, Islet Cell Neoplasm, nearby peripancreatic lymphnode. Given its located directly deep to splenic vein, this lesion could not be sampled. My office will arrange MRI/MRCP of pancreas to get further evaluation of this lesion and also help answer question of pancreatic divisim.   _______________________________ eSignedMilus Banister, MD 08/15/2013 10:15 AM

## 2013-08-15 NOTE — Anesthesia Preprocedure Evaluation (Signed)
Anesthesia Evaluation  Patient identified by MRN, date of birth, ID band Patient awake    Reviewed: Allergy & Precautions, H&P , NPO status , Patient's Chart, lab work & pertinent test results  Airway Mallampati: II TM Distance: >3 FB Neck ROM: Full    Dental no notable dental hx.    Pulmonary neg pulmonary ROS,  breath sounds clear to auscultation  Pulmonary exam normal       Cardiovascular hypertension, Pt. on medications Rhythm:Regular Rate:Normal     Neuro/Psych negative neurological ROS  negative psych ROS   GI/Hepatic negative GI ROS, Neg liver ROS,   Endo/Other  negative endocrine ROS  Renal/GU negative Renal ROS  negative genitourinary   Musculoskeletal negative musculoskeletal ROS (+)   Abdominal   Peds negative pediatric ROS (+)  Hematology negative hematology ROS (+)   Anesthesia Other Findings   Reproductive/Obstetrics negative OB ROS                           Anesthesia Physical Anesthesia Plan  ASA: II  Anesthesia Plan: MAC   Post-op Pain Management:    Induction: Intravenous  Airway Management Planned:   Additional Equipment:   Intra-op Plan:   Post-operative Plan:   Informed Consent: I have reviewed the patients History and Physical, chart, labs and discussed the procedure including the risks, benefits and alternatives for the proposed anesthesia with the patient or authorized representative who has indicated his/her understanding and acceptance.   Dental advisory given  Plan Discussed with: CRNA  Anesthesia Plan Comments:         Anesthesia Quick Evaluation

## 2013-08-15 NOTE — Interval H&P Note (Signed)
History and Physical Interval Note:  08/15/2013 8:47 AM  Deanna [name]  has presented today for surgery, with the diagnosis of Recurrent pancreatitis [577.1]  The various methods of treatment have been discussed with the patient and family. After consideration of risks, benefits and other options for treatment, the patient has consented to  Procedure(s): UPPER ENDOSCOPIC ULTRASOUND (EUS) LINEAR (N/A) as a surgical intervention .  The patient's history has been reviewed, patient examined, no change in status, stable for surgery.  I have reviewed the patient's chart and labs.  Questions were answered to the patient's satisfaction.     Milus Banister

## 2013-08-15 NOTE — Anesthesia Postprocedure Evaluation (Signed)
  Anesthesia Post-op Note  Patient: Deanna [name]  Procedure(s) Performed: Procedure(s) (LRB): UPPER ENDOSCOPIC ULTRASOUND (EUS) LINEAR (N/A)  Patient Location: PACU  Anesthesia Type: MAC  Level of Consciousness: awake and alert   Airway and Oxygen Therapy: Patient Spontanous Breathing  Post-op Pain: mild  Post-op Assessment: Post-op Vital signs reviewed, Patient's Cardiovascular Status Stable, Respiratory Function Stable, Patent Airway and No signs of Nausea or vomiting  Last Vitals:  Filed Vitals:   08/15/13 1038  BP: 151/67  Pulse:   Temp:   Resp: 24    Post-op Vital Signs: stable   Complications: No apparent anesthesia complications

## 2013-08-15 NOTE — Discharge Instructions (Signed)

## 2013-08-15 NOTE — Transfer of Care (Signed)
Immediate Anesthesia Transfer of Care Note  Patient: Deanna [name]  Procedure(s) Performed: Procedure(s): UPPER ENDOSCOPIC ULTRASOUND (EUS) LINEAR (N/A)  Patient Location: PACU  Anesthesia Type:MAC  Level of Consciousness: sedated  Airway & Oxygen Therapy: Patient Spontanous Breathing and Patient connected to nasal cannula oxygen  Post-op Assessment: Report given to PACU RN and Post -op Vital signs reviewed and stable  Post vital signs: Reviewed and stable  Complications: No apparent anesthesia complications

## 2013-08-16 ENCOUNTER — Telehealth: Payer: Self-pay

## 2013-08-16 ENCOUNTER — Encounter (HOSPITAL_COMMUNITY): Payer: Self-pay | Admitting: Gastroenterology

## 2013-08-16 DIAGNOSIS — R933 Abnormal findings on diagnostic imaging of other parts of digestive tract: Secondary | ICD-10-CM

## 2013-08-16 NOTE — Telephone Encounter (Signed)
You have been scheduled for an MRI at [facility] on 08/26/13. Your appointment time is 3 pm . Please arrive 15 minutes prior to your appointment time for registration purposes. NPO 4 hours. However, if you have any metal in your body, have a pacemaker or defibrillator, please be sure to let your ordering physician know. This test typically takes 45 minutes to 1 hour to complete.  Pt has been notified and will keep appt

## 2013-08-16 NOTE — Telephone Encounter (Signed)
Message copied by Barron Alvine on Fri Aug 16, 2013  8:24 AM ------      Message from: Owens Loffler P      Created: Thu Aug 15, 2013 10:17 AM       Najeeb;            Just completed EUS, see below.  I will order her MRI/MRCP.  IF there truly is a lesion in tail of pancreas, this is probably cause of her recurrent pancreatitis and I would refer for tail of pancreas resection.            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 There is a 1.7cm centrally fibrotic or calcified soft tissue lesion in or near the tail of pancreas.  This could represent focal edema from recent pancreatitis, Islet Cell Neoplasm, nearby peripancreatic lymphnode.  Given its located directly deep to splenic vein, this lesion could not be sampled. My office will arrange MRI/MRCP of pancreas to get further evaluation of this lesion and also help answer question of pancreatic divisim.  ------

## 2013-08-26 ENCOUNTER — Ambulatory Visit (HOSPITAL_COMMUNITY)
Admission: RE | Admit: 2013-08-26 | Discharge: 2013-08-26 | Disposition: A | Discharge status: Home / Self Care | Payer: BC Managed Care – PPO | Source: Ambulatory Visit | Attending: Gastroenterology | Admitting: Gastroenterology

## 2013-08-26 ENCOUNTER — Other Ambulatory Visit: Payer: Self-pay | Admitting: Gastroenterology

## 2013-08-26 DIAGNOSIS — K7689 Other specified diseases of liver: Secondary | ICD-10-CM | POA: Insufficient documentation

## 2013-08-26 DIAGNOSIS — R933 Abnormal findings on diagnostic imaging of other parts of digestive tract: Secondary | ICD-10-CM

## 2013-08-26 DIAGNOSIS — N289 Disorder of kidney and ureter, unspecified: Secondary | ICD-10-CM | POA: Insufficient documentation

## 2013-08-26 DIAGNOSIS — K859 Acute pancreatitis without necrosis or infection, unspecified: Secondary | ICD-10-CM | POA: Insufficient documentation

## 2013-08-26 LAB — CREATININE, SERUM
CREATININE: 1.84 mg/dL — AB (ref 0.50–1.10)
GFR calc non Af Amer: 28 mL/min — ABNORMAL LOW (ref >90)
GFR, EST AFRICAN AMERICAN: 33 mL/min — AB (ref >90)

## 2013-08-27 MED ORDER — GADOBENATE DIMEGLUMINE 529 MG/ML IV SOLN
20.0000 mL | Freq: Once | INTRAVENOUS | Status: AC | PRN
  Administered 2013-08-27: 20 mL via INTRAVENOUS

## 2013-08-28 ENCOUNTER — Other Ambulatory Visit: Payer: Self-pay

## 2013-08-28 ENCOUNTER — Telehealth: Payer: Self-pay

## 2013-08-28 DIAGNOSIS — R933 Abnormal findings on diagnostic imaging of other parts of digestive tract: Secondary | ICD-10-CM

## 2013-08-28 NOTE — Telephone Encounter (Signed)
Message copied by Barron Alvine on Wed Aug 28, 2013 10:59 AM ------      Message from: Aviva Signs      Created: Wed Aug 28, 2013 10:52 AM       Firman Petrow,pt is scheduled for May 15 arrive at 10.10am for a 10.30am apt with Dr Barry Dienes.Marland KitchenMarland KitchenThayer Headings      ----- Message -----         From: Barron Alvine, CMA         Sent: 08/28/2013   9:46 AM           To: Aviva Signs            Dr. 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        ------

## 2013-08-28 NOTE — Telephone Encounter (Signed)
[facility] to notify pt  

## 2013-09-13 ENCOUNTER — Encounter (INDEPENDENT_AMBULATORY_CARE_PROVIDER_SITE_OTHER): Payer: Self-pay | Admitting: General Surgery

## 2013-09-13 ENCOUNTER — Ambulatory Visit (INDEPENDENT_AMBULATORY_CARE_PROVIDER_SITE_OTHER): Payer: BC Managed Care – PPO | Admitting: General Surgery

## 2013-09-13 VITALS — BP 162/82 | HR 84 | Resp 18 | Ht 67.0 in | Wt 244.0 lb

## 2013-09-13 DIAGNOSIS — K869 Disease of pancreas, unspecified: Secondary | ICD-10-CM

## 2013-09-13 NOTE — Assessment & Plan Note (Signed)
Pancreatic lesion appears indolent.  Will re-image in 6 months to assess stability.  Hopefully at that point, her renal function will have stabilized and she may be able to get IV contrast.    The ductal anatomy in the pancreatic head appears normal and does not appear to represent pancreas divisum.    It may be that the pancreatic lesion is a result of her episodes of pancreatitis.  She does not have a clear etiology of her pancreatitis, but it does not appear that removing this section will decrease her risk from recurrent episodes.    I will see her back in 6 months with MRI/MRCP w/wo contrast.

## 2013-09-13 NOTE — Progress Notes (Signed)
Chief Complaint  Patient presents with  . Other    Eval lesion on pancreas    HISTORY: Pt is a 63 year old female who is referred by Dr. Ardis Hughs for evaluation of a pancreatic tail lesion. She developed pancreatitis in February was seen to have diffuse changes consistent with pancreatitis on her CT scan. This was her second episode of pancreatitis. She had one in 2012.   She was referred for endoscopic ultrasound to evaluate for causes of recurrent pancreatitis. On the endoscopic ultrasound there was concern for pancreas divisum anatomy.  There is also seen to be at 1.7 cm fibrotic calcified soft tissue lesion in the tail of the pancreas. This was felt to represent focal edema from pancreatitis, islet cell neoplasm, peripancreatic lymph node. It could not be biopsied due to the location behind the splenic vein. The patient then underwent an MRI in order to better assess this.  MRI demonstrated normal ductal anatomy.  There is no gallstone seen. There was a lobular lesion at the patella pancreas is approximately 21 x 19 mm with slightly different signal intensity. There is prominent tissue that was felt to be present on her CT scan from 2012.  She had diarrhea while she had pancreatitis but this resolved after around a month. She denies any early satiety or continued abdominal pain. She is not having any nausea or vomiting. She has lost some weight deliberately, but has had no dramatic unexpected weight loss. She denies any issues with glucose intolerance or diabetes.  Past Medical History  Diagnosis Date  . HTN (hypertension)   . Reflux   . GERD (gastroesophageal reflux disease)   . Hx of pancreatitis twice    Past Surgical History  Procedure Laterality Date  . Foot surgery    . Laparoscopic endometriosis fulguration    . Trigger thumb      rt thumb last year  . Foot surgery Bilateral     bunions  . Knee arthroscopy w/ meniscal repair Left   . Eus N/A 08/15/2013    Procedure: UPPER  ENDOSCOPIC ULTRASOUND (EUS) LINEAR;  Surgeon: Milus Banister, MD;  Location: [facility];  Service: Endoscopy;  Laterality: N/A;    Current Outpatient Prescriptions  Medication Sig Dispense Refill  . amLODipine-benazepril (LOTREL) 5-20 MG per capsule Take 1 capsule by mouth every morning.       . Coenzyme Q10 (CO Q 10 PO) Take 200 mg by mouth daily.       Marland Kitchen esomeprazole (NEXIUM) 40 MG capsule Take 40 mg by mouth at bedtime.       . Glucosamine-Chondroitin (COSAMIN DS PO) Take 1 tablet by mouth 2 (two) times daily.       Javier Docker Oil 300 MG CAPS Take 300 mg by mouth daily.      . Multiple Vitamin (MULTIVITAMIN) tablet Take 1 tablet by mouth daily.      Marland Kitchen aspirin EC 81 MG tablet Take 81 mg by mouth every Monday, Wednesday, and Friday.       . B Complex-C (B-COMPLEX WITH VITAMIN C) tablet Take 1 tablet by mouth daily.      . calcium-vitamin D (OSCAL WITH D) 500-200 MG-UNIT per tablet Take 1 tablet by mouth daily with breakfast.      . CRANBERRY PO Take 1 tablet by mouth daily.      . diphenhydrAMINE (BENADRYL) 25 MG tablet Take 25 mg by mouth at bedtime.      . ondansetron (ZOFRAN) 4 MG tablet Take 1  tablet (4 mg total) by mouth every 8 (eight) hours as needed for nausea or vomiting.  30 tablet  0   No current facility-administered medications for this visit.     Allergies  Allergen Reactions  . Sulfa Antibiotics Itching and Rash     Family History  Problem Relation Age of Onset  . Diabetes       History   Social History  . Marital Status: Married    Spouse Name: N/A    Number of Children: N/A  . Years of Education: 36   Social History Main Topics  . Smoking status: Never Smoker   . Smokeless tobacco: Never Used  . Alcohol Use: No  . Drug Use: No  . Sexual Activity: None   Other Topics Concern  . None   Social History Narrative  . None     REVIEW OF SYSTEMS - PERTINENT POSITIVES ONLY: 12 point review of systems negative other than HPI and PMH  EXAM: Filed  Vitals:   09/13/13 1043  BP: 162/82  Pulse: 84  Resp: 18    Wt Readings from Last 3 Encounters:  09/13/13 244 lb (110.678 kg)  07/29/13 249 lb 6.4 oz (113.127 kg)  06/25/13 260 lb (117.935 kg)     Gen:  No acute distress.  Well nourished and well groomed.   Neurological: Alert and oriented to person, place, and time. Coordination normal.  Head: Normocephalic and atraumatic.  Eyes: Conjunctivae are normal. Pupils are equal, round, and reactive to light. No scleral icterus.  Neck: Normal range of motion. Neck supple. No tracheal deviation or thyromegaly present.  Cardiovascular: Normal rate, regular rhythm, normal heart sounds and intact distal pulses.  Exam reveals no gallop and no friction rub.  No murmur heard. Respiratory: Effort normal.  No respiratory distress. No chest wall tenderness. Breath sounds normal.  No wheezes, rales or rhonchi.  GI: Soft. Bowel sounds are normal. The abdomen is soft and nontender.  There is no rebound and no guarding.  Musculoskeletal: Normal range of motion. Extremities are nontender.  Lymphadenopathy: No cervical, preauricular, postauricular or axillary adenopathy is present Skin: Skin is warm and dry. No rash noted. No diaphoresis. No erythema. No pallor. No clubbing, cyanosis, or edema.   Psychiatric: Normal mood and affect. Behavior is normal. Judgment and thought content normal.    LABORATORY RESULTS: Available labs are reviewed   Recent Results (from the past 2160 hour(s))  CBC     Status: Abnormal   Collection Time    06/25/13 12:05 PM      Result Value Ref Range   WBC 11.5 (*) 4.0 - 10.5 K/uL   RBC 4.45  3.87 - 5.11 MIL/uL   Hemoglobin 14.1  12.0 - 15.0 g/dL   HCT 41.7  36.0 - 46.0 %   MCV 93.7  78.0 - 100.0 fL   MCH 31.7  26.0 - 34.0 pg   MCHC 33.8  30.0 - 36.0 g/dL   RDW 13.5  11.5 - 15.5 %   Platelets 450 (*) 150 - 400 K/uL  COMPREHENSIVE METABOLIC PANEL     Status: None   Collection Time    06/25/13 12:05 PM      Result  Value Ref Range   Sodium 142  135 - 145 mEq/L   Potassium 5.0  3.5 - 5.3 mEq/L   Chloride 100  96 - 112 mEq/L   CO2 29  19 - 32 mEq/L   Glucose, Bld 94  70 - 99  mg/dL   BUN 12  6 - 23 mg/dL   Creat 0.92  0.50 - 1.10 mg/dL   Total Bilirubin 0.4  0.3 - 1.2 mg/dL   Comment: ** Please note change in reference range(s). **   Alkaline Phosphatase 115  39 - 117 U/L   AST 24  0 - 37 U/L   ALT 25  0 - 35 U/L   Total Protein 8.0  6.0 - 8.3 g/dL   Albumin 3.9  3.5 - 5.2 g/dL   Calcium 9.8  8.4 - 10.5 mg/dL  AMYLASE     Status: Abnormal   Collection Time    06/25/13 12:05 PM      Result Value Ref Range   Amylase 164 (*) 0 - 105 U/L  LIPASE     Status: Abnormal   Collection Time    06/25/13 12:05 PM      Result Value Ref Range   Lipase 471 (*) 11 - 59 U/L   Comment:       Amended report.     CALLED CORRECTED REPORT TO MOORE,D AT 1410 BY HUFFINES,S ON 06/25/13  TRIGLYCERIDES     Status: None   Collection Time    06/25/13  9:53 PM      Result Value Ref Range   Triglycerides 132  <150 mg/dL  CBC     Status: Abnormal   Collection Time    06/26/13  6:22 AM      Result Value Ref Range   WBC 12.7 (*) 4.0 - 10.5 K/uL   RBC 3.84 (*) 3.87 - 5.11 MIL/uL   Hemoglobin 11.9 (*) 12.0 - 15.0 g/dL   HCT 35.9 (*) 36.0 - 46.0 %   MCV 93.5  78.0 - 100.0 fL   MCH 31.0  26.0 - 34.0 pg   MCHC 33.1  30.0 - 36.0 g/dL   RDW 13.4  11.5 - 15.5 %   Platelets 371  150 - 400 K/uL  BASIC METABOLIC PANEL     Status: Abnormal   Collection Time    06/26/13  6:22 AM      Result Value Ref Range   Sodium 140  137 - 147 mEq/L   Potassium 4.2  3.7 - 5.3 mEq/L   Chloride 102  96 - 112 mEq/L   CO2 27  19 - 32 mEq/L   Glucose, Bld 133 (*) 70 - 99 mg/dL   BUN 9  6 - 23 mg/dL   Creatinine, Ser 0.85  0.50 - 1.10 mg/dL   Calcium 9.0  8.4 - 10.5 mg/dL   GFR calc non Af Amer 72 (*) >90 mL/min   GFR calc Af Amer 83 (*) >90 mL/min   Comment: (NOTE)     The eGFR has been calculated using the CKD EPI equation.     This  calculation has not been validated in all clinical situations.     eGFR's persistently <90 mL/min signify possible Chronic Kidney     Disease.  AMYLASE     Status: Abnormal   Collection Time    06/26/13  6:22 AM      Result Value Ref Range   Amylase 147 (*) 0 - 105 U/L  MISCELLANEOUS TEST     Status: None   Collection Time    06/26/13  6:43 AM      Result Value Ref Range   Miscellaneous Test IGG4 TEST NUMBER [id number]     Miscellaneous Test Results SEE SEPARATE REPORT  GLUCOSE, CAPILLARY     Status: Abnormal   Collection Time    06/27/13  3:04 AM      Result Value Ref Range   Glucose-Capillary 182 (*) 70 - 99 mg/dL  AMYLASE     Status: None   Collection Time    06/27/13  6:16 AM      Result Value Ref Range   Amylase 83  0 - 105 U/L  LIPASE, BLOOD     Status: Abnormal   Collection Time    06/27/13  6:16 AM      Result Value Ref Range   Lipase 104 (*) 11 - 59 U/L  BASIC METABOLIC PANEL     Status: Abnormal   Collection Time    06/27/13  6:16 AM      Result Value Ref Range   Sodium 139  137 - 147 mEq/L   Potassium 5.3  3.7 - 5.3 mEq/L   Comment: DELTA CHECK NOTED   Chloride 103  96 - 112 mEq/L   CO2 26  19 - 32 mEq/L   Glucose, Bld 163 (*) 70 - 99 mg/dL   BUN 9  6 - 23 mg/dL   Creatinine, Ser 1.46 (*) 0.50 - 1.10 mg/dL   Calcium 8.4  8.4 - 10.5 mg/dL   GFR calc non Af Amer 37 (*) >90 mL/min   GFR calc Af Amer 43 (*) >90 mL/min   Comment: (NOTE)     The eGFR has been calculated using the CKD EPI equation.     This calculation has not been validated in all clinical situations.     eGFR's persistently <90 mL/min signify possible Chronic Kidney     Disease.  HEPATIC FUNCTION PANEL     Status: Abnormal   Collection Time    06/27/13  6:16 AM      Result Value Ref Range   Total Protein 7.1  6.0 - 8.3 g/dL   Albumin 2.8 (*) 3.5 - 5.2 g/dL   AST 24  0 - 37 U/L   ALT 25  0 - 35 U/L   Alkaline Phosphatase 88  39 - 117 U/L   Total Bilirubin 0.3  0.3 - 1.2 mg/dL   Bilirubin,  Direct <0.2  0.0 - 0.3 mg/dL   Indirect Bilirubin NOT CALCULATED  0.3 - 0.9 mg/dL  BASIC METABOLIC PANEL     Status: Abnormal   Collection Time    06/27/13  5:06 PM      Result Value Ref Range   Sodium 140  137 - 147 mEq/L   Potassium 5.1  3.7 - 5.3 mEq/L   Chloride 105  96 - 112 mEq/L   CO2 23  19 - 32 mEq/L   Glucose, Bld 114 (*) 70 - 99 mg/dL   BUN 13  6 - 23 mg/dL   Creatinine, Ser 2.05 (*) 0.50 - 1.10 mg/dL   Calcium 8.6  8.4 - 10.5 mg/dL   GFR calc non Af Amer 25 (*) >90 mL/min   GFR calc Af Amer 29 (*) >90 mL/min   Comment: (NOTE)     The eGFR has been calculated using the CKD EPI equation.     This calculation has not been validated in all clinical situations.     eGFR's persistently <90 mL/min signify possible Chronic Kidney     Disease.  BASIC METABOLIC PANEL     Status: Abnormal   Collection Time    06/28/13  6:24 AM  Result Value Ref Range   Sodium 140  137 - 147 mEq/L   Potassium 4.8  3.7 - 5.3 mEq/L   Chloride 106  96 - 112 mEq/L   CO2 23  19 - 32 mEq/L   Glucose, Bld 107 (*) 70 - 99 mg/dL   BUN 16  6 - 23 mg/dL   Creatinine, Ser 2.25 (*) 0.50 - 1.10 mg/dL   Calcium 8.6  8.4 - 10.5 mg/dL   GFR calc non Af Amer 22 (*) >90 mL/min   GFR calc Af Amer 26 (*) >90 mL/min   Comment: (NOTE)     The eGFR has been calculated using the CKD EPI equation.     This calculation has not been validated in all clinical situations.     eGFR's persistently <90 mL/min signify possible Chronic Kidney     Disease.  CBC     Status: Abnormal   Collection Time    06/28/13  6:24 AM      Result Value Ref Range   WBC 14.8 (*) 4.0 - 10.5 K/uL   RBC 3.34 (*) 3.87 - 5.11 MIL/uL   Hemoglobin 10.5 (*) 12.0 - 15.0 g/dL   HCT 32.8 (*) 36.0 - 46.0 %   MCV 98.2  78.0 - 100.0 fL   MCH 31.4  26.0 - 34.0 pg   MCHC 32.0  30.0 - 36.0 g/dL   RDW 14.2  11.5 - 15.5 %   Platelets 346  150 - 400 K/uL  AMYLASE     Status: None   Collection Time    06/28/13  6:24 AM      Result Value Ref Range    Amylase 44  0 - 105 U/L  LIPASE, BLOOD     Status: None   Collection Time    06/28/13  6:24 AM      Result Value Ref Range   Lipase 44  11 - 59 U/L  CREATININE, URINE, RANDOM     Status: None   Collection Time    06/28/13 11:45 AM      Result Value Ref Range   Creatinine, Urine 232.75    SODIUM, URINE, RANDOM     Status: None   Collection Time    06/28/13 11:45 AM      Result Value Ref Range   Sodium, Ur 110    PHOSPHORUS     Status: None   Collection Time    06/29/13  5:59 AM      Result Value Ref Range   Phosphorus 3.0  2.3 - 4.6 mg/dL  FERRITIN     Status: None   Collection Time    06/29/13  5:59 AM      Result Value Ref Range   Ferritin 229  10 - 291 ng/mL   Comment: Performed at [facility]     Status: Abnormal   Collection Time    06/29/13  5:59 AM      Result Value Ref Range   Iron 18 (*) 42 - 135 ug/dL   TIBC 193 (*) 250 - 470 ug/dL   Saturation Ratios 9 (*) 20 - 55 %   UIBC 175  125 - 400 ug/dL   Comment: Performed at [facility]     Status: Abnormal   Collection Time    06/29/13  6:02 AM      Result Value Ref Range   Sodium 139  137 - 147  mEq/L   Potassium 4.4  3.7 - 5.3 mEq/L   Chloride 107  96 - 112 mEq/L   CO2 22  19 - 32 mEq/L   Glucose, Bld 98  70 - 99 mg/dL   BUN 16  6 - 23 mg/dL   Creatinine, Ser 1.92 (*) 0.50 - 1.10 mg/dL   Calcium 8.8  8.4 - 10.5 mg/dL   GFR calc non Af Amer 27 (*) >90 mL/min   GFR calc Af Amer 31 (*) >90 mL/min   Comment: (NOTE)     The eGFR has been calculated using the CKD EPI equation.     This calculation has not been validated in all clinical situations.     eGFR's persistently <90 mL/min signify possible Chronic Kidney     Disease.  CBC     Status: Abnormal   Collection Time    06/29/13  6:02 AM      Result Value Ref Range   WBC 12.6 (*) 4.0 - 10.5 K/uL   RBC 3.29 (*) 3.87 - 5.11 MIL/uL   Hemoglobin 10.5 (*) 12.0 - 15.0 g/dL   HCT 31.8 (*) 36.0 - 46.0 %    MCV 96.7  78.0 - 100.0 fL   MCH 31.9  26.0 - 34.0 pg   MCHC 33.0  30.0 - 36.0 g/dL   RDW 13.7  11.5 - 15.5 %   Platelets 377  150 - 400 K/uL  URINALYSIS, ROUTINE W REFLEX MICROSCOPIC     Status: Abnormal   Collection Time    06/29/13 11:15 AM      Result Value Ref Range   Color, Urine YELLOW  YELLOW   APPearance CLOUDY (*) CLEAR   Specific Gravity, Urine >1.030 (*) 1.005 - 1.030   pH 5.0  5.0 - 8.0   Glucose, UA NEGATIVE  NEGATIVE mg/dL   Hgb urine dipstick TRACE (*) NEGATIVE   Bilirubin Urine NEGATIVE  NEGATIVE   Ketones, ur NEGATIVE  NEGATIVE mg/dL   Protein, ur NEGATIVE  NEGATIVE mg/dL   Urobilinogen, UA 0.2  0.0 - 1.0 mg/dL   Nitrite NEGATIVE  NEGATIVE   Leukocytes, UA MODERATE (*) NEGATIVE  URINE MICROSCOPIC-ADD ON     Status: Abnormal   Collection Time    06/29/13 11:15 AM      Result Value Ref Range   WBC, UA TOO NUMEROUS TO COUNT  <3 WBC/hpf   RBC / HPF 0-2  <3 RBC/hpf   Bacteria, UA MANY (*) RARE   Casts HYALINE CASTS (*) NEGATIVE  URINE CULTURE     Status: None   Collection Time    06/29/13 11:15 AM      Result Value Ref Range   Specimen Description URINE, CLEAN CATCH     Special Requests Normal     Culture  Setup Time       Value: 06/29/2013 22:47     Performed at [facility] Count       Value: NO GROWTH     Performed at [facility]   Culture       Value: NO GROWTH     Performed at [facility]   Report Status 06/30/2013 FINAL    BASIC METABOLIC PANEL     Status: Abnormal   Collection Time    06/30/13  5:10 AM      Result Value Ref Range   Sodium 137  137 - 147 mEq/L   Potassium 3.6 (*) 3.7 - 5.3 mEq/L  Comment: DELTA CHECK NOTED   Chloride 102  96 - 112 mEq/L   CO2 23  19 - 32 mEq/L   Glucose, Bld 84  70 - 99 mg/dL   BUN 16  6 - 23 mg/dL   Creatinine, Ser 1.74 (*) 0.50 - 1.10 mg/dL   Calcium 8.4  8.4 - 10.5 mg/dL   GFR calc non Af Amer 30 (*) >90 mL/min   GFR calc Af Amer 35 (*) >90 mL/min   Comment: (NOTE)      The eGFR has been calculated using the CKD EPI equation.     This calculation has not been validated in all clinical situations.     eGFR's persistently <90 mL/min signify possible Chronic Kidney     Disease.  BASIC METABOLIC PANEL     Status: Abnormal   Collection Time    07/01/13  5:49 AM      Result Value Ref Range   Sodium 138  137 - 147 mEq/L   Potassium 3.0 (*) 3.7 - 5.3 mEq/L   Chloride 101  96 - 112 mEq/L   CO2 24  19 - 32 mEq/L   Glucose, Bld 87  70 - 99 mg/dL   BUN 14  6 - 23 mg/dL   Creatinine, Ser 1.55 (*) 0.50 - 1.10 mg/dL   Calcium 8.5  8.4 - 10.5 mg/dL   GFR calc non Af Amer 35 (*) >90 mL/min   GFR calc Af Amer 40 (*) >90 mL/min   Comment: (NOTE)     The eGFR has been calculated using the CKD EPI equation.     This calculation has not been validated in all clinical situations.     eGFR's persistently <90 mL/min signify possible Chronic Kidney     Disease.  LIPASE, BLOOD     Status: None   Collection Time    07/01/13  5:49 AM      Result Value Ref Range   Lipase 31  11 - 59 U/L  CREATININE, SERUM     Status: Abnormal   Collection Time    08/26/13  2:13 PM      Result Value Ref Range   Creatinine, Ser 1.84 (*) 0.50 - 1.10 mg/dL   GFR calc non Af Amer 28 (*) >90 mL/min   GFR calc Af Amer 33 (*) >90 mL/min   Comment: (NOTE)     The eGFR has been calculated using the CKD EPI equation.     This calculation has not been validated in all clinical situations.     eGFR's persistently <90 mL/min signify possible Chronic Kidney     Disease.     RADIOLOGY RESULTS: See E-Chart or I-Site for most recent results.  Images and reports are reviewed.  Mr Abdomen Mrcp Wo Cm  08/26/2013   CLINICAL DATA:  Rexrode pancreatitis.  Renal insufficiency  EXAM: MRI ABDOMEN WITHOUT  (INCLUDING MRCP)  TECHNIQUE: Multiplanar multisequence MR imaging of the abdomen was performed. Heavily T2-weighted images of the biliary and pancreatic ducts were obtained, and three-dimensional MRCP  images were rendered by post processing.  COMPARISON:  CT 06/25/2013  FINDINGS: Significant loss of signal within the liver parenchyma on the opossed phase imaging consistent with diffuse hepatic steatosis. Small benign hepatic cyst in the right hepatic lobe. No intrahepatic biliary duct dilatation. The gallbladder is normal in diameter without evidence of gallstones. The common bile duct is normal caliber. Common hepatic duct and intrahepatic biliary ducts are normal caliber. There is no filling  defect or external compression of the common bile duct evident.  The pancreatic duct likewise appears normal. There is no evidence a of variant pancreatic ductal anatomy. The head, body and tail of the pancreas appear normal. There is a lobular lesion at the tail the pancreas 19 mm x 21 mm which has slightly differing signal intensity and texture from the adjacent normal pancreatic parenchyma (image 13, series 13). The tissue is slightly hyperintense to the adjacent pancreatic parenchyma on the T2 weighted series (series 3 and series 4, image 16). Lesion is similar intensity on the T1 weighted imaging. On out of phase imaging the lesion is slightly hypointense to the pancreas (image 58 of series 9).  Spleen, adrenal glands, kidneys are normal.  There is no abdominal lymphadenopathy No para poor adenopathy.  IMPRESSION: 1. No evidence of variant pancreatic ductal anatomy. 2. No cholelithiasis or choledocholithiasis. 3. Indeterminate tissue at the tail of the pancreas with signal abnormality defect from normal pancreatic tissue. . Differential includes date pancreatic neoplasm versus pancreatic inflammation from chronic pancreatitis. Patient cannot receive IV contrast due to renal insufficiency (GFR = 28) which would aid in differentiation. The tissue was present on the prior CT of 10/30/2010 therefore is likely benign or at least indolent. 4. Hepatic steatosis.   Electronically Signed   By: Suzy Bouchard M.D.   On:  08/26/2013 16:53   Mr 3d Recon At Scanner  08/26/2013   CLINICAL DATA:  Rexrode pancreatitis.  Renal insufficiency  EXAM: MRI ABDOMEN WITHOUT  (INCLUDING MRCP)  TECHNIQUE: Multiplanar multisequence MR imaging of the abdomen was performed. Heavily T2-weighted images of the biliary and pancreatic ducts were obtained, and three-dimensional MRCP images were rendered by post processing.  COMPARISON:  CT 06/25/2013  FINDINGS: Significant loss of signal within the liver parenchyma on the opossed phase imaging consistent with diffuse hepatic steatosis. Small benign hepatic cyst in the right hepatic lobe. No intrahepatic biliary duct dilatation. The gallbladder is normal in diameter without evidence of gallstones. The common bile duct is normal caliber. Common hepatic duct and intrahepatic biliary ducts are normal caliber. There is no filling defect or external compression of the common bile duct evident.  The pancreatic duct likewise appears normal. There is no evidence a of variant pancreatic ductal anatomy. The head, body and tail of the pancreas appear normal. There is a lobular lesion at the tail the pancreas 19 mm x 21 mm which has slightly differing signal intensity and texture from the adjacent normal pancreatic parenchyma (image 13, series 13). The tissue is slightly hyperintense to the adjacent pancreatic parenchyma on the T2 weighted series (series 3 and series 4, image 16). Lesion is similar intensity on the T1 weighted imaging. On out of phase imaging the lesion is slightly hypointense to the pancreas (image 58 of series 9).  Spleen, adrenal glands, kidneys are normal.  There is no abdominal lymphadenopathy No para poor adenopathy.  IMPRESSION: 1. No evidence of variant pancreatic ductal anatomy. 2. No cholelithiasis or choledocholithiasis. 3. Indeterminate tissue at the tail of the pancreas with signal abnormality defect from normal pancreatic tissue. . Differential includes date pancreatic neoplasm versus  pancreatic inflammation from chronic pancreatitis. Patient cannot receive IV contrast due to renal insufficiency (GFR = 28) which would aid in differentiation. The tissue was present on the prior CT of 10/30/2010 therefore is likely benign or at least indolent. 4. Hepatic steatosis.   Electronically Signed   By: Suzy Bouchard M.D.   On: 08/26/2013 16:53  ASSESSMENT AND PLAN: Pancreatic lesion Pancreatic lesion appears indolent.  Will re-image in 6 months to assess stability.  Hopefully at that point, her renal function will have stabilized and she may be able to get IV contrast.    The ductal anatomy in the pancreatic head appears normal and does not appear to represent pancreas divisum.    It may be that the pancreatic lesion is a result of her episodes of pancreatitis.  She does not have a clear etiology of her pancreatitis, but it does not appear that removing this section will decrease her risk from recurrent episodes.    I will see her back in 6 months with MRI/MRCP w/wo contrast.       Milus Height MD Surgical On[facility]      Visit Diagnoses: 1. Pancreatic lesion     Primary Care Physician: Alonza Bogus, MD

## 2013-09-13 NOTE — Patient Instructions (Signed)
Follow up in October with MRI/MRCP prior to visit.    Call for recurrent problems prior to this.

## 2013-10-08 ENCOUNTER — Encounter (INDEPENDENT_AMBULATORY_CARE_PROVIDER_SITE_OTHER): Payer: Self-pay

## 2013-10-18 ENCOUNTER — Other Ambulatory Visit (HOSPITAL_COMMUNITY): Payer: Self-pay | Admitting: Pulmonary Disease

## 2013-10-18 DIAGNOSIS — Z1231 Encounter for screening mammogram for malignant neoplasm of breast: Secondary | ICD-10-CM

## 2013-10-22 ENCOUNTER — Ambulatory Visit (HOSPITAL_COMMUNITY)
Admission: RE | Admit: 2013-10-22 | Discharge: 2013-10-22 | Disposition: A | Discharge status: Home / Self Care | Payer: BC Managed Care – PPO | Source: Ambulatory Visit | Attending: Pulmonary Disease | Admitting: Pulmonary Disease

## 2013-10-22 DIAGNOSIS — Z1231 Encounter for screening mammogram for malignant neoplasm of breast: Secondary | ICD-10-CM | POA: Insufficient documentation

## 2013-11-05 ENCOUNTER — Ambulatory Visit (INDEPENDENT_AMBULATORY_CARE_PROVIDER_SITE_OTHER): Payer: BC Managed Care – PPO | Admitting: Internal Medicine

## 2013-11-07 ENCOUNTER — Telehealth (INDEPENDENT_AMBULATORY_CARE_PROVIDER_SITE_OTHER): Payer: Self-pay | Admitting: *Deleted

## 2013-11-07 NOTE — Telephone Encounter (Signed)
Deanna [name] called and said she was not able to make her apt with Dr. Laural Golden on 11/05/13. She did reschedule her apt for 11/12/13 with [name], NP while Dr. Laural Golden is in the office.

## 2013-11-12 ENCOUNTER — Ambulatory Visit (INDEPENDENT_AMBULATORY_CARE_PROVIDER_SITE_OTHER): Payer: BC Managed Care – PPO | Admitting: Internal Medicine

## 2013-11-12 ENCOUNTER — Encounter (INDEPENDENT_AMBULATORY_CARE_PROVIDER_SITE_OTHER): Payer: Self-pay | Admitting: Internal Medicine

## 2013-11-12 VITALS — BP 130/72 | HR 76 | Temp 98.1°F | Ht 67.0 in | Wt 240.7 lb

## 2013-11-12 DIAGNOSIS — K859 Acute pancreatitis without necrosis or infection, unspecified: Secondary | ICD-10-CM

## 2013-11-12 DIAGNOSIS — K8500 Idiopathic acute pancreatitis without necrosis or infection: Secondary | ICD-10-CM

## 2013-11-12 NOTE — Progress Notes (Signed)
Subjective:     Patient ID: Deanna [name], female   DOB: 04-Jun-1950, 63 y.o.   MRN: [id number]  HPI Here today for f/u of her pancreatitis. She was last seen by Dr. Laural Golden in March of this year. She tells me she is doing fine. There has been no abdominal pain.  There has been no further abdominal pain.  She has had 2 bouts of pancreatitis in the past. First 2012 and the second one was in March.  Appetite has been. She has lost 8 pounds since her last visit in March.   Referred to Dr. Barry Dienes  By Dr. Ardis Hughs in May for possible resection of the pancrease. Saw patient in office and felt he would re-image in 6 months to assess stability of the lesion. He felt the pancreatic lesion may hae caused her episodes of pancreatitis. Per notes: she does not have a clear etiology of her pancreatitis.   The ductal anatomy in the pancreatic head appears normal and does not appear to represent pancreas divisum.       BMET    Component Value Date/Time   NA 138 07/01/2013 0549   K 3.0* 07/01/2013 0549   CL 101 07/01/2013 0549   CO2 24 07/01/2013 0549   GLUCOSE 87 07/01/2013 0549   BUN 14 07/01/2013 0549   CREATININE 1.84* 08/26/2013 1413   CREATININE 0.92 06/25/2013 1205   CALCIUM 8.5 07/01/2013 0549   GFRNONAA 28* 08/26/2013 1413   GFRAA 33* 08/26/2013 1413    CBC    Component Value Date/Time   WBC 12.6* 06/29/2013 0602   RBC 3.29* 06/29/2013 0602   HGB 10.5* 06/29/2013 0602   HCT 31.8* 06/29/2013 0602   PLT 377 06/29/2013 0602   MCV 96.7 06/29/2013 0602   MCH 31.9 06/29/2013 0602   MCHC 33.0 06/29/2013 0602   RDW 13.7 06/29/2013 0602   LYMPHSABS 1.6 10/30/2010 1510   MONOABS 1.2* 10/30/2010 1510   EOSABS 0.6 10/30/2010 1510   BASOSABS 0.0 10/30/2010 1510        08/16/2013 MR abdomen/MRCP [id number]:  1. No evidence of variant pancreatic ductal anatomy.  2. No cholelithiasis or choledocholithiasis.  3. Indeterminate tissue at the tail of the pancreas with signal  abnormality defect from normal pancreatic  tissue. . Differential  includes date pancreatic neoplasm versus pancreatic inflammation  from chronic pancreatitis. Patient cannot receive IV contrast due to  renal insufficiency (GFR = 28) which would aid in differentiation.  The tissue was present on the prior CT of 10/30/2010 therefore is  likely benign or at least indolent.  4. Hepatic steatosis.  08/15/2013 EUS Dr. 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 1.7 cm centrally fibrotic or calcified soft tissue lesion in or near the tail of the pancrease. This could represent focal edema from recent pancreatitis, islet cell neoplasm, nearby peripancreatic lymph node. Review of Systems     Objective:   Physical Exam  Filed Vitals:   11/12/13 1538  BP: 130/72  Pulse: 76  Temp: 98.1 F (36.7 C)  Height: 5\' 7"  (1.702 m)  Weight: 240 lb 11.2 oz (109.181 kg)  Alert and oriented. Skin warm and dry. Oral mucosa is moist.   . Sclera anicteric, conjunctivae is pink. Thyroid not enlarged. No cervical lymphadenopathy. Lungs clear. Heart regular rate and rhythm.  Abdomen is soft. Bowel sounds are positive. No hepatomegaly. No abdominal masses felt. No tenderness.  No edema to lower extremities.        Assessment:    Hx of  pancreatitis ? Etiology.  Followed by Dr. Barry Dienes at present. She will have a repeat MRI/MRCP in October. I discussed with Dr. Laural Golden.     Plan:     Follow up on a prn basis.

## 2013-11-12 NOTE — Patient Instructions (Signed)
OV on a prn basis. If any problems, call our office

## 2014-01-16 ENCOUNTER — Other Ambulatory Visit (INDEPENDENT_AMBULATORY_CARE_PROVIDER_SITE_OTHER): Payer: Self-pay | Admitting: *Deleted

## 2014-01-16 DIAGNOSIS — K8689 Other specified diseases of pancreas: Secondary | ICD-10-CM

## 2014-01-23 ENCOUNTER — Other Ambulatory Visit: Payer: BC Managed Care – PPO

## 2014-01-28 ENCOUNTER — Ambulatory Visit
Admission: RE | Admit: 2014-01-28 | Discharge: 2014-01-28 | Disposition: A | Discharge status: Home / Self Care | Payer: BC Managed Care – PPO | Source: Ambulatory Visit | Attending: General Surgery | Admitting: General Surgery

## 2014-01-28 ENCOUNTER — Other Ambulatory Visit (INDEPENDENT_AMBULATORY_CARE_PROVIDER_SITE_OTHER): Payer: Self-pay | Admitting: General Surgery

## 2014-01-28 DIAGNOSIS — K8689 Other specified diseases of pancreas: Secondary | ICD-10-CM

## 2014-02-14 ENCOUNTER — Other Ambulatory Visit: Payer: Self-pay

## 2014-02-14 ENCOUNTER — Other Ambulatory Visit (INDEPENDENT_AMBULATORY_CARE_PROVIDER_SITE_OTHER): Payer: Self-pay | Admitting: General Surgery

## 2014-02-14 DIAGNOSIS — K869 Disease of pancreas, unspecified: Secondary | ICD-10-CM

## 2014-02-22 ENCOUNTER — Other Ambulatory Visit: Payer: BC Managed Care – PPO

## 2014-09-20 ENCOUNTER — Emergency Department (HOSPITAL_COMMUNITY)
Admission: EM | Admit: 2014-09-20 | Discharge: 2014-09-20 | Disposition: A | Discharge status: Home / Self Care | Payer: BC Managed Care – PPO | Attending: Emergency Medicine | Admitting: Emergency Medicine

## 2014-09-20 ENCOUNTER — Emergency Department (HOSPITAL_COMMUNITY): Payer: BC Managed Care – PPO

## 2014-09-20 ENCOUNTER — Encounter (HOSPITAL_COMMUNITY): Payer: Self-pay | Admitting: *Deleted

## 2014-09-20 DIAGNOSIS — I10 Essential (primary) hypertension: Secondary | ICD-10-CM | POA: Insufficient documentation

## 2014-09-20 DIAGNOSIS — M5432 Sciatica, left side: Secondary | ICD-10-CM | POA: Insufficient documentation

## 2014-09-20 DIAGNOSIS — M25552 Pain in left hip: Secondary | ICD-10-CM | POA: Diagnosis present

## 2014-09-20 DIAGNOSIS — Z79899 Other long term (current) drug therapy: Secondary | ICD-10-CM | POA: Diagnosis not present

## 2014-09-20 DIAGNOSIS — K219 Gastro-esophageal reflux disease without esophagitis: Secondary | ICD-10-CM | POA: Diagnosis not present

## 2014-09-20 DIAGNOSIS — M25559 Pain in unspecified hip: Secondary | ICD-10-CM

## 2014-09-20 MED ORDER — METHOCARBAMOL 500 MG PO TABS
500.0000 mg | ORAL_TABLET | Freq: Once | ORAL | Status: AC
  Administered 2014-09-20: 500 mg via ORAL
  Filled 2014-09-20: qty 1

## 2014-09-20 MED ORDER — OXYCODONE HCL 5 MG PO TABS
5.0000 mg | ORAL_TABLET | Freq: Once | ORAL | Status: AC
  Administered 2014-09-20: 5 mg via ORAL
  Filled 2014-09-20: qty 1

## 2014-09-20 MED ORDER — TRAMADOL HCL 50 MG PO TABS: 50.0000 mg | ORAL_TABLET | Freq: Once | ORAL | Status: DC

## 2014-09-20 MED ORDER — ONDANSETRON 4 MG PO TBDP
4.0000 mg | ORAL_TABLET | Freq: Once | ORAL | Status: AC
  Administered 2014-09-20: 4 mg via ORAL
  Filled 2014-09-20: qty 1

## 2014-09-20 MED ORDER — ONDANSETRON HCL 4 MG PO TABS: 4.0000 mg | ORAL_TABLET | Freq: Four times a day (QID) | ORAL | Status: DC

## 2014-09-20 MED ORDER — PREDNISONE 10 MG PO TABS: 20.0000 mg | ORAL_TABLET | Freq: Two times a day (BID) | ORAL | Status: DC

## 2014-09-20 MED ORDER — OXYCODONE HCL 5 MG PO TABS: 5.0000 mg | ORAL_TABLET | ORAL | Status: DC | PRN

## 2014-09-20 MED ORDER — OXYCODONE-ACETAMINOPHEN 5-325 MG PO TABS: 1.0000 | ORAL_TABLET | ORAL | Status: DC | PRN

## 2014-09-20 NOTE — ED Notes (Signed)
Pt verbalized understanding of no driving and to use caution within 4 hours of taking pain meds due to meds cause drowsiness 

## 2014-09-20 NOTE — ED Notes (Signed)
Pt states lower back pain and left hip pain since last Tuesday. No known injury. Pt is ambulatory

## 2014-09-20 NOTE — ED Provider Notes (Signed)
CSN: [id number]     Arrival date & time 09/20/14  1246 History   First MD Initiated Contact with Patient 09/20/14 1311     Chief Complaint  Patient presents with  . Hip Pain     (Consider location/radiation/quality/duration/timing/severity/associated sxs/prior Treatment) Patient is a 64 y.o. female presenting with back pain.  Back Pain Location:  Lumbar spine Quality:  Aching and shooting Radiates to: left buttock. Pain severity:  Severe Pain is:  Same all the time Duration:  10 days Timing:  Constant Progression:  Worsening Relieved by:  Nothing Worsened by:  Movement and bending Associated symptoms: no bladder incontinence and no bowel incontinence    Deanna [name] is a 64 y.o. female who presents to the ED with low back pain that radiates to the left hip x 4 days. She does not remember any injury. She states that she took her husband's flexeril and it did not help. She alternates between tylenol and ibuprofen but that hasn't helped. She tries not to take much medication due to hx of pancreatitis.  Past Medical History  Diagnosis Date  . HTN (hypertension)   . Reflux   . GERD (gastroesophageal reflux disease)   . Hx of pancreatitis twice   Past Surgical History  Procedure Laterality Date  . Foot surgery    . Laparoscopic endometriosis fulguration    . Trigger thumb      rt thumb last year  . Foot surgery Bilateral     bunions  . Knee arthroscopy w/ meniscal repair Left   . Eus N/A 08/15/2013    Procedure: UPPER ENDOSCOPIC ULTRASOUND (EUS) LINEAR;  Surgeon: Milus Banister, MD;  Location: [facility];  Service: Endoscopy;  Laterality: N/A;   Family History  Problem Relation Age of Onset  . Diabetes     History  Substance Use Topics  . Smoking status: Never Smoker   . Smokeless tobacco: Never Used  . Alcohol Use: No   OB History    No data available     Review of Systems  Gastrointestinal: Negative for bowel incontinence.  Genitourinary: Negative for  bladder incontinence.  Musculoskeletal: Positive for back pain.  left hip pain All other systems negative    Allergies  Sulfa antibiotics  Home Medications   Prior to Admission medications   Medication Sig Start Date End Date Taking? Authorizing Provider  amLODipine-benazepril (LOTREL) 5-20 MG per capsule Take 1 capsule by mouth every morning.    Yes Historical Provider, MD  Coenzyme Q10 (CO Q 10 PO) Take 200 mg by mouth daily.    Yes Historical Provider, MD  diphenhydrAMINE (BENADRYL) 25 MG tablet Take 25 mg by mouth at bedtime.   Yes Historical Provider, MD  esomeprazole (NEXIUM) 40 MG capsule Take 40 mg by mouth at bedtime.    Yes Historical Provider, MD  Glucosamine-Chondroitin (COSAMIN DS PO) Take 1 tablet by mouth 2 (two) times daily.    Yes Historical Provider, MD  ibuprofen (ADVIL,MOTRIN) 200 MG tablet Take 200 mg by mouth every 6 (six) hours as needed for moderate pain.   Yes Historical Provider, MD  Javier Docker Oil 300 MG CAPS Take 300 mg by mouth daily.   Yes Historical Provider, MD  Multiple Vitamin (MULTIVITAMIN) tablet Take 1 tablet by mouth daily.   Yes Historical Provider, MD  ondansetron (ZOFRAN) 4 MG tablet Take 1 tablet (4 mg total) by mouth every 6 (six) hours. 09/20/14   Hope Bunnie Pion, NP  oxyCODONE-acetaminophen (ROXICET) 5-325 MG per tablet  Take 1 tablet by mouth every 4 (four) hours as needed. 09/20/14   Hope Bunnie Pion, NP  predniSONE (DELTASONE) 10 MG tablet Take 2 tablets (20 mg total) by mouth 2 (two) times daily with a meal. 09/20/14   Hope Bunnie Pion, NP   BP 184/79 mmHg  Pulse 68  Temp(Src) 98.1 F (36.7 C) (Oral)  Resp 16  Ht 5\' 7"  (1.702 m)  Wt 245 lb (111.131 kg)  BMI 38.36 kg/m2  SpO2 100% Physical Exam  Constitutional: She is oriented to person, place, and time. She appears well-developed and well-nourished. No distress.  HENT:  Head: Normocephalic and atraumatic.  Right Ear: Tympanic membrane normal.  Left Ear: Tympanic membrane normal.  Nose: Nose  normal.  Mouth/Throat: Uvula is midline, oropharynx is clear and moist and mucous membranes are normal.  Eyes: EOM are normal.  Neck: Normal range of motion. Neck supple.  Cardiovascular: Normal rate and regular rhythm.   Pulmonary/Chest: Effort normal. She has no wheezes. She has no rales.  Abdominal: Soft. Bowel sounds are normal. There is no tenderness.  Musculoskeletal: Normal range of motion.       Lumbar back: She exhibits tenderness, pain and spasm. She exhibits normal pulse.  Neurological: She is alert and oriented to person, place, and time. She has normal strength. No cranial nerve deficit or sensory deficit. Gait normal.  Reflex Scores:      Bicep reflexes are 2+ on the right side and 2+ on the left side.      Brachioradialis reflexes are 2+ on the right side and 2+ on the left side.      Patellar reflexes are 2+ on the right side and 2+ on the left side.      Achilles reflexes are 2+ on the right side and 2+ on the left side. Skin: Skin is warm and dry.  Psychiatric: She has a normal mood and affect. Her behavior is normal.  Nursing note and vitals reviewed.   ED Course  Procedures (including critical care time) Labs Review Labs Reviewed - No data to display  Imaging Review Dg Lumbar Spine Complete  09/20/2014   CLINICAL DATA:  Back pain for 1 week. Left back pain radiating down to left hip. No known injury.  EXAM: LUMBAR SPINE - COMPLETE 4+ VIEW  COMPARISON:  None.  FINDINGS: Degenerative disc disease changes at L5-S1 with disc space narrowing and spurring. Degenerative facet disease in the lower lumbar spine. Normal alignment. No fracture. SI joints are symmetric and unremarkable.  IMPRESSION: Degenerative disc and facet disease in the lower lumbar spine. No acute bony abnormality.   Electronically Signed   By: Rolm Baptise M.D.   On: 09/20/2014 14:46   Dg Hip Unilat With Pelvis 2-3 Views Left  09/20/2014   CLINICAL DATA:  Left hip pain.  No injury.  EXAM: LEFT HIP (WITH  PELVIS) 2-3 VIEWS  COMPARISON:  None.  FINDINGS: There is no evidence of hip fracture or dislocation. There is no evidence of arthropathy or other focal bone abnormality. Hip joints and SI joints are symmetric and unremarkable.  IMPRESSION: Negative.   Electronically Signed   By: Rolm Baptise M.D.   On: 09/20/2014 17:26  Dr. Wyvonnia Dusky in to examine the patient. Will order left hip film.  MDM  64 y.o. female with low back and left hip pain stable for d/c without focal neuro deficits. No neurovascular compromise. Will treat for sciatica and she will follow up with her PCP for further evaluation  and pain management. She will return here as needed for problems.   Final diagnoses:  Hip pain  Sciatica, left       [facility], NP 09/20/14 1738  Milton Ferguson, MD 09/21/14 1505

## 2014-09-20 NOTE — Discharge Instructions (Signed)
Take the medication as directed. Do not drive if you take the narcotic. Follow up with Dr. Luan Pulling. Return here as needed.

## 2014-09-21 NOTE — ED Provider Notes (Signed)
Medical screening examination/treatment/procedure(s) were conducted as a shared visit with non-physician practitioner(s) and myself.  I personally evaluated the patient during the encounter.   Atraumatic L SI joint pain.  No pain with ROM of hip. No weakness, numbness, tingling, fever, vomiting, incontinence.  No abdominal pain. Point tenderness at L SI joint.  5/5 strength in bilateral lower extremities. Ankle plantar and dorsiflexion intact. Great toe extension intact bilaterally. +2 DP and PT pulses. +2 patellar reflexes bilaterally. Normal gait.    EKG Interpretation None       Ezequiel Essex, MD 09/21/14 1139

## 2014-09-22 ENCOUNTER — Ambulatory Visit (HOSPITAL_COMMUNITY)
Admission: RE | Admit: 2014-09-22 | Discharge: 2014-09-22 | Disposition: A | Discharge status: Home / Self Care | Payer: BC Managed Care – PPO | Source: Ambulatory Visit | Attending: Pulmonary Disease | Admitting: Pulmonary Disease

## 2014-09-22 ENCOUNTER — Other Ambulatory Visit (HOSPITAL_COMMUNITY): Payer: Self-pay | Admitting: Pulmonary Disease

## 2014-09-22 DIAGNOSIS — R52 Pain, unspecified: Secondary | ICD-10-CM

## 2014-09-22 DIAGNOSIS — M79672 Pain in left foot: Secondary | ICD-10-CM | POA: Diagnosis not present

## 2014-09-24 ENCOUNTER — Other Ambulatory Visit (HOSPITAL_COMMUNITY): Payer: Self-pay | Admitting: Pulmonary Disease

## 2014-09-24 DIAGNOSIS — Z1231 Encounter for screening mammogram for malignant neoplasm of breast: Secondary | ICD-10-CM

## 2014-09-27 ENCOUNTER — Emergency Department (HOSPITAL_COMMUNITY)
Admission: EM | Admit: 2014-09-27 | Discharge: 2014-09-28 | Disposition: A | Discharge status: Home / Self Care | Payer: BC Managed Care – PPO | Attending: Emergency Medicine | Admitting: Emergency Medicine

## 2014-09-27 ENCOUNTER — Encounter (HOSPITAL_COMMUNITY): Payer: Self-pay

## 2014-09-27 DIAGNOSIS — R630 Anorexia: Secondary | ICD-10-CM | POA: Diagnosis not present

## 2014-09-27 DIAGNOSIS — Z79899 Other long term (current) drug therapy: Secondary | ICD-10-CM | POA: Diagnosis not present

## 2014-09-27 DIAGNOSIS — R1084 Generalized abdominal pain: Secondary | ICD-10-CM

## 2014-09-27 DIAGNOSIS — K5900 Constipation, unspecified: Secondary | ICD-10-CM | POA: Diagnosis not present

## 2014-09-27 DIAGNOSIS — Z9889 Other specified postprocedural states: Secondary | ICD-10-CM | POA: Insufficient documentation

## 2014-09-27 DIAGNOSIS — I10 Essential (primary) hypertension: Secondary | ICD-10-CM | POA: Diagnosis not present

## 2014-09-27 DIAGNOSIS — Z7952 Long term (current) use of systemic steroids: Secondary | ICD-10-CM | POA: Diagnosis not present

## 2014-09-27 DIAGNOSIS — K219 Gastro-esophageal reflux disease without esophagitis: Secondary | ICD-10-CM | POA: Insufficient documentation

## 2014-09-27 NOTE — ED Notes (Signed)
I am having stomach cramps and I think it is related to constipation due to taking oxycodone per pt. I did senakot and drank prune juice and had a couple of small bowel movements this morning.

## 2014-09-28 ENCOUNTER — Emergency Department (HOSPITAL_COMMUNITY): Payer: BC Managed Care – PPO

## 2014-09-28 DIAGNOSIS — K5900 Constipation, unspecified: Secondary | ICD-10-CM | POA: Diagnosis not present

## 2014-09-28 LAB — CBC WITH DIFFERENTIAL/PLATELET
Basophils Absolute: 0 10*3/uL (ref 0.0–0.1)
Basophils Relative: 0 % (ref 0–1)
EOS ABS: 0 10*3/uL (ref 0.0–0.7)
EOS PCT: 0 % (ref 0–5)
HCT: 41.7 % (ref 36.0–46.0)
HEMOGLOBIN: 13.8 g/dL (ref 12.0–15.0)
Lymphocytes Relative: 10 % — ABNORMAL LOW (ref 12–46)
Lymphs Abs: 2.4 10*3/uL (ref 0.7–4.0)
MCH: 30.3 pg (ref 26.0–34.0)
MCHC: 33.1 g/dL (ref 30.0–36.0)
MCV: 91.6 fL (ref 78.0–100.0)
Monocytes Absolute: 1 10*3/uL (ref 0.1–1.0)
Monocytes Relative: 4 % (ref 3–12)
NEUTROS PCT: 86 % — AB (ref 43–77)
Neutro Abs: 20.6 10*3/uL — ABNORMAL HIGH (ref 1.7–7.7)
Platelets: 440 10*3/uL — ABNORMAL HIGH (ref 150–400)
RBC: 4.55 MIL/uL (ref 3.87–5.11)
RDW: 13.5 % (ref 11.5–15.5)
WBC: 24.1 10*3/uL — AB (ref 4.0–10.5)

## 2014-09-28 LAB — BASIC METABOLIC PANEL
Anion gap: 12 (ref 5–15)
BUN: 27 mg/dL — ABNORMAL HIGH (ref 6–20)
CO2: 22 mmol/L (ref 22–32)
Calcium: 8.7 mg/dL — ABNORMAL LOW (ref 8.9–10.3)
Chloride: 101 mmol/L (ref 101–111)
Creatinine, Ser: 1.53 mg/dL — ABNORMAL HIGH (ref 0.44–1.00)
GFR calc Af Amer: 41 mL/min — ABNORMAL LOW (ref >60)
GFR calc non Af Amer: 35 mL/min — ABNORMAL LOW (ref >60)
GLUCOSE: 122 mg/dL — AB (ref 65–99)
Potassium: 3.9 mmol/L (ref 3.5–5.1)
SODIUM: 135 mmol/L (ref 135–145)

## 2014-09-28 LAB — URINALYSIS, ROUTINE W REFLEX MICROSCOPIC
BILIRUBIN URINE: NEGATIVE
Glucose, UA: NEGATIVE mg/dL
KETONES UR: NEGATIVE mg/dL
Nitrite: NEGATIVE
PROTEIN: NEGATIVE mg/dL
SPECIFIC GRAVITY, URINE: 1.02 (ref 1.005–1.030)
Urobilinogen, UA: 0.2 mg/dL (ref 0.0–1.0)
pH: 5.5 (ref 5.0–8.0)

## 2014-09-28 LAB — URINE MICROSCOPIC-ADD ON

## 2014-09-28 MED ORDER — FLEET ENEMA 7-19 GM/118ML RE ENEM
1.0000 | ENEMA | Freq: Once | RECTAL | Status: AC
  Administered 2014-09-28: 1 via RECTAL

## 2014-09-28 MED ORDER — PEG 3350-KCL-NA BICARB-NACL 420 G PO SOLR
4000.0000 mL | Freq: Once | ORAL | Status: AC
  Administered 2014-09-28: 4000 mL via ORAL

## 2014-09-28 MED ORDER — BISACODYL 10 MG RE SUPP
10.0000 mg | Freq: Once | RECTAL | Status: AC
  Administered 2014-09-28: 10 mg via RECTAL
  Filled 2014-09-28: qty 1

## 2014-09-28 MED ORDER — SODIUM CHLORIDE 0.9 % IV BOLUS (SEPSIS)
1000.0000 mL | Freq: Once | INTRAVENOUS | Status: AC
  Administered 2014-09-28: 1000 mL via INTRAVENOUS

## 2014-09-28 MED ORDER — PEG 3350-KCL-NA BICARB-NACL 420 G PO SOLR
ORAL | Status: AC
  Filled 2014-09-28: qty 4000

## 2014-09-28 NOTE — Discharge Instructions (Signed)
Finish the [employer] when you get home. You can then take miralax and put one dose or 17 g in 8 ounces of water, take once or twice daily to prevent constipation while you are on the pain medications. Return to the ED if your abdominal pain gets worse, you get vomiting or fever. .     Constipation Constipation is when a person has fewer than three bowel movements a week, has difficulty having a bowel movement, or has stools that are dry, hard, or larger than normal. As people grow older, constipation is more common. If you try to fix constipation with medicines that make you have a bowel movement (laxatives), the problem may get worse. Long-term laxative use may cause the muscles of the colon to become weak. A low-fiber diet, not taking in enough fluids, and taking certain medicines may make constipation worse.  CAUSES   Certain medicines, such as antidepressants, pain medicine, iron supplements, antacids, and water pills.   Certain diseases, such as diabetes, irritable bowel syndrome (IBS), thyroid disease, or depression.   Not drinking enough water.   Not eating enough fiber-rich foods.   Stress or travel.   Lack of physical activity or exercise.   Ignoring the urge to have a bowel movement.   Using laxatives too much.  SIGNS AND SYMPTOMS   Having fewer than three bowel movements a week.   Straining to have a bowel movement.   Having stools that are hard, dry, or larger than normal.   Feeling full or bloated.   Pain in the lower abdomen.   Not feeling relief after having a bowel movement.  DIAGNOSIS  Your health care provider will take a medical history and perform a physical exam. Further testing may be done for severe constipation. Some tests may include:  A barium enema X-ray to examine your rectum, colon, and, sometimes, your small intestine.   A sigmoidoscopy to examine your lower colon.   A colonoscopy to examine your entire colon. TREATMENT    Treatment will depend on the severity of your constipation and what is causing it. Some dietary treatments include drinking more fluids and eating more fiber-rich foods. Lifestyle treatments may include regular exercise. If these diet and lifestyle recommendations do not help, your health care provider may recommend taking over-the-counter laxative medicines to help you have bowel movements. Prescription medicines may be prescribed if over-the-counter medicines do not work.  HOME CARE INSTRUCTIONS   Eat foods that have a lot of fiber, such as fruits, vegetables, whole grains, and beans.  Limit foods high in fat and processed sugars, such as french fries, hamburgers, cookies, candies, and soda.   A fiber supplement may be added to your diet if you cannot get enough fiber from foods.   Drink enough fluids to keep your urine clear or pale yellow.   Exercise regularly or as directed by your health care provider.   Go to the restroom when you have the urge to go. Do not hold it.   Only take over-the-counter or prescription medicines as directed by your health care provider. Do not take other medicines for constipation without talking to your health care provider first.  [place] IF:   You have bright red blood in your stool.   Your constipation lasts for more than 4 days or gets worse.   You have abdominal or rectal pain.   You have thin, pencil-like stools.   You have unexplained weight loss. MAKE SURE YOU:  Understand these instructions.  Will watch your condition.  Will get help right away if you are not doing well or get worse. Document Released: 01/15/2004 Document Revised: 04/23/2013 Document Reviewed: 01/28/2013 [facility] Patient Information 2015 [place], [place]. This information is not intended to replace advice given to you by your health care provider. Make sure you discuss any questions you have with your health care provider.

## 2014-09-28 NOTE — ED Provider Notes (Addendum)
CSN: [id number]     Arrival date & time 09/27/14  2304 History  This chart was scribed for Rolland Porter, MD by Stephania Fragmin, ED Scribe. This patient was seen in room APA02/APA02 and the patient's care was started at 12:22 AM.    Chief Complaint  Patient presents with  . Abdominal Cramping   The history is provided by the patient. No language interpreter was used.     HPI Comments: Deanna [name] is a 64 y.o. female who presents to the Emergency Department complaining of cramping generalized abdominal pain that began 3 days ago as intermittent but became constant and acutely worsened at 4 pm today, about 8 hours ago. She complains of associated nausea without vomiting and states she only ate lunch today because she has had no appetite. Patient was given oxycodone for a sciatic nerve problem and thinks they may be the cause of her symptoms. She last took pain medication this morning. She has had abnormal, small BMs today, although they are not hard in consistency.  Patient denies a history of any abdominal surgeries. She denies fever or any urinary symptoms. She denies smoking or EtOH consumption. Patient is retired, but she used to work in [employer], [employer].   PCP Dr. Luan Pulling   Past Medical History  Diagnosis Date  . HTN (hypertension)   . Reflux   . GERD (gastroesophageal reflux disease)   . Hx of pancreatitis twice   Past Surgical History  Procedure Laterality Date  . Foot surgery    . Laparoscopic endometriosis fulguration    . Trigger thumb      rt thumb last year  . Foot surgery Bilateral     bunions  . Knee arthroscopy w/ meniscal repair Left   . Eus N/A 08/15/2013    Procedure: UPPER ENDOSCOPIC ULTRASOUND (EUS) LINEAR;  Surgeon: Milus Banister, MD;  Location: [facility];  Service: Endoscopy;  Laterality: N/A;   Family History  Problem Relation Age of Onset  . Diabetes     History  Substance Use Topics  . Smoking status: Never Smoker   . Smokeless tobacco: Never Used  . Alcohol  Use: No   Retired Lives at home Lives with spouse  OB History    No data available     Review of Systems  Constitutional: Positive for appetite change. Negative for fever.  Gastrointestinal: Positive for abdominal pain and constipation.  Genitourinary: Negative for dysuria, urgency, frequency, hematuria, decreased urine volume and difficulty urinating.  All other systems reviewed and are negative.   Allergies  Sulfa antibiotics  Home Medications   Prior to Admission medications   Medication Sig Start Date End Date Taking? Authorizing Provider  amLODipine-benazepril (LOTREL) 5-20 MG per capsule Take 1 capsule by mouth every morning.    Yes Historical Provider, MD  Coenzyme Q10 (CO Q 10 PO) Take 200 mg by mouth daily.    Yes Historical Provider, MD  diphenhydrAMINE (BENADRYL) 25 MG tablet Take 25 mg by mouth at bedtime.   Yes Historical Provider, MD  esomeprazole (NEXIUM) 40 MG capsule Take 40 mg by mouth at bedtime.    Yes Historical Provider, MD  Glucosamine-Chondroitin (COSAMIN DS PO) Take 1 tablet by mouth 2 (two) times daily.    Yes Historical Provider, MD  ibuprofen (ADVIL,MOTRIN) 200 MG tablet Take 200 mg by mouth every 6 (six) hours as needed for moderate pain.   Yes Historical Provider, MD  Javier Docker Oil 300 MG CAPS Take 300 mg by mouth daily.  Yes Historical Provider, MD  Multiple Vitamin (MULTIVITAMIN) tablet Take 1 tablet by mouth daily.   Yes Historical Provider, MD  ondansetron (ZOFRAN) 4 MG tablet Take 1 tablet (4 mg total) by mouth every 6 (six) hours. 09/20/14  Yes Hope Bunnie Pion, NP  oxyCODONE (ROXICODONE) 5 MG immediate release tablet Take 1 tablet (5 mg total) by mouth every 4 (four) hours as needed for severe pain. 09/20/14  Yes Hope Bunnie Pion, NP  predniSONE (DELTASONE) 10 MG tablet Take 2 tablets (20 mg total) by mouth 2 (two) times daily with a meal. 09/20/14  Yes Hope Bunnie Pion, NP   BP 183/85 mmHg  Pulse 84  Temp(Src) 98.7 F (37.1 C) (Oral)  Resp 16  Ht 5\' 7"   (1.702 m)  Wt 245 lb (111.131 kg)  BMI 38.36 kg/m2  SpO2 99%  Vital signs normal except for hypertension  Physical Exam  Constitutional: She is oriented to person, place, and time. She appears well-developed and well-nourished.  Non-toxic appearance. She does not appear ill. No distress.  HENT:  Head: Normocephalic and atraumatic.  Right Ear: External ear normal.  Left Ear: External ear normal.  Nose: Nose normal. No mucosal edema or rhinorrhea.  Mouth/Throat: Oropharynx is clear and moist and mucous membranes are normal. No dental abscesses or uvula swelling.  Tongue slightly dry.  Eyes: Conjunctivae and EOM are normal. Pupils are equal, round, and reactive to light.  Neck: Normal range of motion and full passive range of motion without pain. Neck supple.  Cardiovascular: Normal rate, regular rhythm and normal heart sounds.  Exam reveals no gallop and no friction rub.   No murmur heard. Pulmonary/Chest: Effort normal and breath sounds normal. No respiratory distress. She has no wheezes. She has no rhonchi. She has no rales. She exhibits no tenderness and no crepitus.  Abdominal: Soft. Normal appearance and bowel sounds are normal. She exhibits no distension. There is tenderness. There is no rebound and no guarding.  Mild diffuse tenderness. Abdomen not tight. Active bowel sounds.  Musculoskeletal: Normal range of motion. She exhibits no edema or tenderness.  Moves all extremities well.   Neurological: She is alert and oriented to person, place, and time. She has normal strength. No cranial nerve deficit.  Skin: Skin is warm, dry and intact. No rash noted. No erythema. No pallor.  Psychiatric: She has a normal mood and affect. Her speech is normal and behavior is normal. Her mood appears not anxious.  Nursing note and vitals reviewed.   ED Course  Procedures (including critical care time)  Medications  sodium chloride 0.9 % bolus 1,000 mL (0 mLs Intravenous Stopped 09/28/14 0346)   bisacodyl (DULCOLAX) suppository 10 mg (10 mg Rectal Given 09/28/14 0143)  sodium phosphate (FLEET) 7-19 GM/118ML enema 1 enema (1 enema Rectal Given 09/28/14 0235)  polyethylene glycol-electrolytes (NuLYTELY/GoLYTELY) solution 4,000 mL (4,000 mLs Oral Given 09/28/14 0339)     DIAGNOSTIC STUDIES: Oxygen Saturation is 99% on RA, normal by my interpretation.    COORDINATION OF CARE: 12:26 AM - Discussed treatment plan with pt at bedside which includes XR, and pt agreed to plan.  Pt given dulcolax with small results. She was then given a fleets enema again with small results. She was given golytely and only drank about 1/4 of it and again had some results, but she states her abdominal  pain is improving. However, she states her sciatic pain is getting worse from the hard stretcher and chairs. Wants to go home and finish the  golytely. Discussed treatment with miralax afterwards while on the narcotic pain medication    Labs Review Results for orders placed or performed during the hospital encounter of 09/27/14  CBC with Differential  Result Value Ref Range   WBC 24.1 (H) 4.0 - 10.5 K/uL   RBC 4.55 3.87 - 5.11 MIL/uL   Hemoglobin 13.8 12.0 - 15.0 g/dL   HCT 41.7 36.0 - 46.0 %   MCV 91.6 78.0 - 100.0 fL   MCH 30.3 26.0 - 34.0 pg   MCHC 33.1 30.0 - 36.0 g/dL   RDW 13.5 11.5 - 15.5 %   Platelets 440 (H) 150 - 400 K/uL   Neutrophils Relative % 86 (H) 43 - 77 %   Neutro Abs 20.6 (H) 1.7 - 7.7 K/uL   Lymphocytes Relative 10 (L) 12 - 46 %   Lymphs Abs 2.4 0.7 - 4.0 K/uL   Monocytes Relative 4 3 - 12 %   Monocytes Absolute 1.0 0.1 - 1.0 K/uL   Eosinophils Relative 0 0 - 5 %   Eosinophils Absolute 0.0 0.0 - 0.7 K/uL   Basophils Relative 0 0 - 1 %   Basophils Absolute 0.0 0.0 - 0.1 K/uL  Basic metabolic panel  Result Value Ref Range   Sodium 135 135 - 145 mmol/L   Potassium 3.9 3.5 - 5.1 mmol/L   Chloride 101 101 - 111 mmol/L   CO2 22 22 - 32 mmol/L   Glucose, Bld 122 (H) 65 - 99 mg/dL    BUN 27 (H) 6 - 20 mg/dL   Creatinine, Ser 1.53 (H) 0.44 - 1.00 mg/dL   Calcium 8.7 (L) 8.9 - 10.3 mg/dL   GFR calc non Af Amer 35 (L) >60 mL/min   GFR calc Af Amer 41 (L) >60 mL/min   Anion gap 12 5 - 15  Urinalysis, Routine w reflex microscopic  Result Value Ref Range   Color, Urine YELLOW YELLOW   APPearance CLEAR CLEAR   Specific Gravity, Urine 1.020 1.005 - 1.030   pH 5.5 5.0 - 8.0   Glucose, UA NEGATIVE NEGATIVE mg/dL   Hgb urine dipstick TRACE (A) NEGATIVE   Bilirubin Urine NEGATIVE NEGATIVE   Ketones, ur NEGATIVE NEGATIVE mg/dL   Protein, ur NEGATIVE NEGATIVE mg/dL   Urobilinogen, UA 0.2 0.0 - 1.0 mg/dL   Nitrite NEGATIVE NEGATIVE   Leukocytes, UA SMALL (A) NEGATIVE  Urine microscopic-add on  Result Value Ref Range   Squamous Epithelial / LPF FEW (A) RARE   WBC, UA 3-6 <3 WBC/hpf   RBC / HPF 0-2 <3 RBC/hpf   Bacteria, UA RARE RARE   Laboratory interpretation all normal except leukocytosis, renal insufficency     Imaging Review Dg Abd 2 Views  09/28/2014   CLINICAL DATA:  Generalized abdominal pain with cramping.  EXAM: ABDOMEN - 2 VIEW  COMPARISON:  06/25/2013 abdominal CT  FINDINGS: Large volume of stool distending the colon. No evidence of small bowel obstruction. No evidence of pneumoperitoneum. No concerning intra-abdominal mass effect or calcification. Lung bases are clear.  IMPRESSION: 1. Large volume of stool and gas with proximal colonic distention. 2. No small bowel obstruction.   Electronically Signed   By: Monte Fantasia M.D.   On: 09/28/2014 01:15     MDM   Final diagnoses:  Diffuse abdominal pain  Constipation    Plan discharge  Rolland Porter, MD, FACEP   I personally performed the services described in this documentation, which was scribed in my presence. The recorded  information has been reviewed and considered.  Rolland Porter, MD, Barbette Or, MD 09/28/14 1886  Rolland Porter, MD 09/28/14 [phone]

## 2014-10-03 ENCOUNTER — Telehealth (HOSPITAL_COMMUNITY): Payer: Self-pay | Admitting: Physical Therapy

## 2014-10-03 NOTE — Telephone Encounter (Signed)
Patient states she is doing much better now and that she doesn't want to do any PT at this time.

## 2014-10-08 ENCOUNTER — Ambulatory Visit (HOSPITAL_COMMUNITY): Payer: BC Managed Care – PPO | Admitting: Physical Therapy

## 2014-10-24 ENCOUNTER — Other Ambulatory Visit (HOSPITAL_COMMUNITY): Payer: Self-pay | Admitting: Pulmonary Disease

## 2014-10-24 ENCOUNTER — Ambulatory Visit (HOSPITAL_COMMUNITY)
Admission: RE | Admit: 2014-10-24 | Discharge: 2014-10-24 | Disposition: A | Discharge status: Home / Self Care | Payer: BC Managed Care – PPO | Source: Ambulatory Visit | Attending: Pulmonary Disease | Admitting: Pulmonary Disease

## 2014-10-24 DIAGNOSIS — Z1231 Encounter for screening mammogram for malignant neoplasm of breast: Secondary | ICD-10-CM

## 2015-01-21 ENCOUNTER — Other Ambulatory Visit (HOSPITAL_COMMUNITY): Payer: Self-pay | Admitting: Orthopedic Surgery

## 2015-01-21 DIAGNOSIS — M25561 Pain in right knee: Secondary | ICD-10-CM

## 2015-02-03 ENCOUNTER — Other Ambulatory Visit (HOSPITAL_COMMUNITY): Payer: Self-pay | Admitting: Orthopedic Surgery

## 2015-02-03 ENCOUNTER — Ambulatory Visit (HOSPITAL_COMMUNITY)
Admission: RE | Admit: 2015-02-03 | Discharge: 2015-02-03 | Disposition: A | Discharge status: Home / Self Care | Payer: BC Managed Care – PPO | Source: Ambulatory Visit | Attending: Orthopedic Surgery | Admitting: Orthopedic Surgery

## 2015-02-03 ENCOUNTER — Ambulatory Visit (HOSPITAL_COMMUNITY): Payer: BC Managed Care – PPO

## 2015-02-03 DIAGNOSIS — M25561 Pain in right knee: Secondary | ICD-10-CM

## 2015-02-03 DIAGNOSIS — M23241 Derangement of anterior horn of lateral meniscus due to old tear or injury, right knee: Secondary | ICD-10-CM | POA: Diagnosis not present

## 2015-02-03 DIAGNOSIS — M23221 Derangement of posterior horn of medial meniscus due to old tear or injury, right knee: Secondary | ICD-10-CM | POA: Insufficient documentation

## 2015-02-03 DIAGNOSIS — M25461 Effusion, right knee: Secondary | ICD-10-CM | POA: Insufficient documentation

## 2015-05-01 ENCOUNTER — Encounter (HOSPITAL_COMMUNITY): Payer: Self-pay | Admitting: Physician Assistant

## 2015-05-01 DIAGNOSIS — K219 Gastro-esophageal reflux disease without esophagitis: Secondary | ICD-10-CM | POA: Diagnosis present

## 2015-05-01 DIAGNOSIS — M1711 Unilateral primary osteoarthritis, right knee: Secondary | ICD-10-CM | POA: Diagnosis present

## 2015-05-01 DIAGNOSIS — Z8719 Personal history of other diseases of the digestive system: Secondary | ICD-10-CM

## 2015-05-01 NOTE — H&P (Signed)
TOTAL KNEE ADMISSION H&P  Patient is being admitted for right total knee arthroplasty.  Subjective:  Chief Complaint:right knee pain.  HPI: Deanna [name], 64 y.o. female, has a history of pain and functional disability in the right knee due to arthritis and has failed non-surgical conservative treatments for greater than 12 weeks to includeNSAID's and/or analgesics, corticosteriod injections, viscosupplementation injections, flexibility and strengthening excercises, use of assistive devices, weight reduction as appropriate and activity modification.  Onset of symptoms was gradual, starting 10 years ago with gradually worsening course since that time. The patient noted no past surgery on the right knee(s).  Patient currently rates pain in the right knee(s) at 9 out of 10 with activity. Patient has night pain, worsening of pain with activity and weight bearing, pain that interferes with activities of daily living, crepitus and joint swelling.  Patient has evidence of subchondral sclerosis, periarticular osteophytes and joint space narrowing by imaging studies. There is no active infection.  Patient Active Problem List   Diagnosis Date Noted  . Primary localized osteoarthritis of right knee   . GERD (gastroesophageal reflux disease)   . Hx of pancreatitis   . Pancreatic lesion 09/13/2013  . Nonspecific (abnormal) findings on radiological and other examination of gastrointestinal tract 08/15/2013  . Acute kidney injury ([place]) 06/28/2013  . Pancreatitis, acute 06/25/2013  . HTN (hypertension) 06/25/2013  . Obesity 06/25/2013  . Acquired trigger finger 02/01/2012   Past Medical History  Diagnosis Date  . HTN (hypertension)   . Reflux   . GERD (gastroesophageal reflux disease)   . Hx of pancreatitis twice    attributes to Etodolac and Zpack  . Primary localized osteoarthritis of right knee     Past Surgical History  Procedure Laterality Date  . Foot surgery    . Laparoscopic endometriosis  fulguration    . Trigger thumb      rt thumb last year  . Foot surgery Bilateral     bunions  . Knee arthroscopy w/ meniscal repair Left   . Eus N/A 08/15/2013    Procedure: UPPER ENDOSCOPIC ULTRASOUND (EUS) LINEAR;  Surgeon: Milus Banister, MD;  Location: [facility];  Service: Endoscopy;  Laterality: N/A;    No prescriptions prior to admission   No current facility-administered medications for this encounter.  Current outpatient prescriptions:  .  amLODipine-benazepril (LOTREL) 5-20 MG per capsule, Take 1 capsule by mouth every morning. , Disp: , Rfl:  .  Coenzyme Q10 (CO Q 10 PO), Take 200 mg by mouth daily. , Disp: , Rfl:  .  diphenhydrAMINE (BENADRYL) 25 MG tablet, Take 25 mg by mouth at bedtime., Disp: , Rfl:  .  esomeprazole (NEXIUM) 40 MG capsule, Take 40 mg by mouth at bedtime. , Disp: , Rfl:  .  Glucosamine-Chondroitin (COSAMIN DS PO), Take 1 tablet by mouth 2 (two) times daily. , Disp: , Rfl:  .  ibuprofen (ADVIL,MOTRIN) 200 MG tablet, Take 200 mg by mouth every 6 (six) hours as needed for moderate pain., Disp: , Rfl:  .  Krill Oil 300 MG CAPS, Take 300 mg by mouth daily., Disp: , Rfl:  .  Multiple Vitamin (MULTIVITAMIN) tablet, Take 1 tablet by mouth daily., Disp: , Rfl:  .  ondansetron (ZOFRAN) 4 MG tablet, Take 1 tablet (4 mg total) by mouth every 6 (six) hours., Disp: 12 tablet, Rfl: 0 .  oxyCODONE (ROXICODONE) 5 MG immediate release tablet, Take 1 tablet (5 mg total) by mouth every 4 (four) hours as needed  for severe pain., Disp: 15 tablet, Rfl: 0 .  predniSONE (DELTASONE) 10 MG tablet, Take 2 tablets (20 mg total) by mouth 2 (two) times daily with a meal., Disp: 16 tablet, Rfl: 0   Allergies  Allergen Reactions  . Sulfa Antibiotics Itching and Rash    Social History  Substance Use Topics  . Smoking status: Never Smoker   . Smokeless tobacco: Never Used  . Alcohol Use: No    Family History  Problem Relation Age of Onset  . Diabetes Father   . Hypertension  Father      Review of Systems  Constitutional: Negative.   HENT: Negative.   Eyes: Negative.   Cardiovascular: Negative.   Genitourinary: Negative.   Musculoskeletal: Positive for joint pain.       Right knee pain  Skin: Negative.   Neurological: Negative.   Endo/Heme/Allergies: Negative.     Objective:  Physical Exam  Constitutional: She is oriented to person, place, and time. She appears well-developed and well-nourished.  HENT:  Head: Normocephalic and atraumatic.  Mouth/Throat: Oropharynx is clear and moist.  Eyes: Conjunctivae are normal. Pupils are equal, round, and reactive to light.  Neck: Neck supple.  Cardiovascular: Normal rate and regular rhythm.   Respiratory: Effort normal and breath sounds normal.  GI: Soft. Bowel sounds are normal.  Genitourinary:  Not pertinent to current symptomatology therefore not examined.  Musculoskeletal:  Examination of her right knee reveals pain medially and laterally, 1+ crepitation, 1+ synovitis.  Range of motion is from -5 to 125 degrees. The knee is stable with diffuse pain and normal patellar tracking.  Examination of the left knee reveals full range of motion without pain, swelling, weakness or instability. Vascular exam: pulses are 2+ and symmetric.      Neurological: She is alert and oriented to person, place, and time.  Skin: Skin is warm and dry.  Psychiatric: She has a normal mood and affect.    Vital signs in last 24 hours: Temp:  [98.1 F (36.7 C)] 98.1 F (36.7 C) (12/30 1300) Pulse Rate:  [73] 73 (12/30 1300) BP: (166)/(93) 166/93 mmHg (12/30 1300) SpO2:  [98 %] 98 % (12/30 1300) Weight:  [112.946 kg (249 lb)] 112.946 kg (249 lb) (12/30 1300)  Labs:   Estimated body mass index is 38.99 kg/(m^2) as calculated from the following:   Height as of this encounter: 5\' 7"  (1.702 m).   Weight as of this encounter: 112.946 kg (249 lb).   Imaging Review Plain radiographs demonstrate severe degenerative joint disease  of the right knee(s). The overall alignment issignificant varus. The bone quality appears to be good for age and reported activity level.  Assessment/Plan:  End stage arthritis, right knee  Principal Problem:   Primary localized osteoarthritis of right knee Active Problems:   HTN (hypertension)   Obesity   GERD (gastroesophageal reflux disease)   Hx of pancreatitis   The patient history, physical examination, clinical judgment of the provider and imaging studies are consistent with end stage degenerative joint disease of the right knee(s) and total knee arthroplasty is deemed medically necessary. The treatment options including medical management, injection therapy arthroscopy and arthroplasty were discussed at length. The risks and benefits of total knee arthroplasty were presented and reviewed. The risks due to aseptic loosening, infection, stiffness, patella tracking problems, thromboembolic complications and other imponderables were discussed. The patient acknowledged the explanation, agreed to proceed with the plan and consent was signed. Patient is being admitted for inpatient treatment for  surgery, pain control, PT, OT, prophylactic antibiotics, VTE prophylaxis, progressive ambulation and ADL's and discharge planning. The patient is planning to be discharged home with home health services

## 2015-05-07 NOTE — Pre-Procedure Instructions (Signed)
Deanna [name]  05/07/2015      [facility] [place], [place] [id number] [place] [id number] Phone: [phone] Fax: [phone]    Your procedure is scheduled on Mon, Jan 16 @ 10:00 AM.  Report to [facility] Admitting at 8:00 AM  Call this number if you have problems the morning of surgery:  [phone]   Remember:  Do not eat food or drink liquids after midnight.  Take these medicines the morning of surgery with A SIP OF WATER Pantoprazole(Protonix) and Amlodipine-Benazepril(Lotrel)              Stop taking your CO Q10,Ibuprofen,Krill Oil,any Vitamins or Herbal Medications. No Goody's,BC's,Aleve,Ibuprofen,Motrin,or Advil.   Do not wear jewelry, make-up or nail polish.  Do not wear lotions, powders, or perfumes.  You may wear deodorant.  Do not shave 48 hours prior to surgery.    Do not bring valuables to the hospital.  [facility] is not responsible for any belongings or valuables.  Contacts, dentures or bridgework may not be worn into surgery.  Leave your suitcase in the car.  After surgery it may be brought to your room.  For patients admitted to the hospital, discharge time will be determined by your treatment team.  Patients discharged the day of surgery will not be allowed to drive home.    Special instructions:  [facility] - Preparing for Surgery  Before surgery, you can play an important role.  Because skin is not sterile, your skin needs to be as free of germs as possible.  You can reduce the number of germs on you skin by washing with CHG (chlorahexidine gluconate) soap before surgery.  CHG is an antiseptic cleaner which kills germs and bonds with the skin to continue killing germs even after washing.  Please DO NOT use if you have an allergy to CHG or antibacterial soaps.  If your skin becomes reddened/irritated stop using the CHG and inform your nurse when you arrive at Short Stay.  Do not shave  (including legs and underarms) for at least 48 hours prior to the first CHG shower.  You may shave your face.  Please follow these instructions carefully:   1.  Shower with CHG Soap the night before surgery and the                                morning of Surgery.  2.  If you choose to wash your hair, wash your hair first as usual with your       normal shampoo.  3.  After you shampoo, rinse your hair and body thoroughly to remove the                      Shampoo.  4.  Use CHG as you would any other liquid soap.  You can apply chg directly       to the skin and wash gently with scrungie or a clean washcloth.  5.  Apply the CHG Soap to your body ONLY FROM THE NECK DOWN.        Do not use on open wounds or open sores.  Avoid contact with your eyes,       ears, mouth and genitals (private parts).  Wash genitals (private parts)       with your normal soap.  6.  Wash thoroughly,  paying special attention to the area where your surgery        will be performed.  7.  Thoroughly rinse your body with warm water from the neck down.  8.  DO NOT shower/wash with your normal soap after using and rinsing off       the CHG Soap.  9.  Pat yourself dry with a clean towel.            10.  Wear clean pajamas.            11.  Place clean sheets on your bed the night of your first shower and do not        sleep with pets.  Day of Surgery  Do not apply any lotions/deoderants the morning of surgery.  Please wear clean clothes to the hospital/surgery center.    Please read over the following fact sheets that you were given. Pain Booklet, Coughing and Deep Breathing, Blood Transfusion Information, MRSA Information and Surgical Site Infection Prevention

## 2015-05-08 ENCOUNTER — Encounter (HOSPITAL_COMMUNITY): Payer: Self-pay

## 2015-05-08 ENCOUNTER — Encounter (HOSPITAL_COMMUNITY)
Admission: RE | Admit: 2015-05-08 | Discharge: 2015-05-08 | Disposition: A | Discharge status: Home / Self Care | Payer: BC Managed Care – PPO | Source: Ambulatory Visit | Attending: Orthopedic Surgery | Admitting: Orthopedic Surgery

## 2015-05-08 DIAGNOSIS — Z79899 Other long term (current) drug therapy: Secondary | ICD-10-CM | POA: Insufficient documentation

## 2015-05-08 DIAGNOSIS — Z01818 Encounter for other preprocedural examination: Secondary | ICD-10-CM | POA: Insufficient documentation

## 2015-05-08 DIAGNOSIS — M1711 Unilateral primary osteoarthritis, right knee: Secondary | ICD-10-CM | POA: Insufficient documentation

## 2015-05-08 DIAGNOSIS — K219 Gastro-esophageal reflux disease without esophagitis: Secondary | ICD-10-CM | POA: Insufficient documentation

## 2015-05-08 DIAGNOSIS — R9431 Abnormal electrocardiogram [ECG] [EKG]: Secondary | ICD-10-CM | POA: Diagnosis not present

## 2015-05-08 DIAGNOSIS — Z01812 Encounter for preprocedural laboratory examination: Secondary | ICD-10-CM | POA: Insufficient documentation

## 2015-05-08 DIAGNOSIS — K449 Diaphragmatic hernia without obstruction or gangrene: Secondary | ICD-10-CM | POA: Insufficient documentation

## 2015-05-08 DIAGNOSIS — I129 Hypertensive chronic kidney disease with stage 1 through stage 4 chronic kidney disease, or unspecified chronic kidney disease: Secondary | ICD-10-CM | POA: Diagnosis not present

## 2015-05-08 DIAGNOSIS — N189 Chronic kidney disease, unspecified: Secondary | ICD-10-CM | POA: Insufficient documentation

## 2015-05-08 DIAGNOSIS — Z0183 Encounter for blood typing: Secondary | ICD-10-CM | POA: Insufficient documentation

## 2015-05-08 HISTORY — DX: Chronic kidney disease, unspecified: N18.9

## 2015-05-08 HISTORY — DX: Personal history of other diseases of the digestive system: Z87.19

## 2015-05-08 LAB — CBC WITH DIFFERENTIAL/PLATELET
Basophils Absolute: 0.1 10*3/uL (ref 0.0–0.1)
Basophils Relative: 1 %
Eosinophils Absolute: 0.5 10*3/uL (ref 0.0–0.7)
Eosinophils Relative: 5 %
HCT: 40.5 % (ref 36.0–46.0)
Hemoglobin: 13.2 g/dL (ref 12.0–15.0)
Lymphocytes Relative: 25 %
Lymphs Abs: 2.6 10*3/uL (ref 0.7–4.0)
MCH: 30.1 pg (ref 26.0–34.0)
MCHC: 32.6 g/dL (ref 30.0–36.0)
MCV: 92.5 fL (ref 78.0–100.0)
Monocytes Absolute: 0.7 10*3/uL (ref 0.1–1.0)
Monocytes Relative: 6 %
Neutro Abs: 6.6 10*3/uL (ref 1.7–7.7)
Neutrophils Relative %: 63 %
Platelets: 372 10*3/uL (ref 150–400)
RBC: 4.38 MIL/uL (ref 3.87–5.11)
RDW: 13.2 % (ref 11.5–15.5)
WBC: 10.4 10*3/uL (ref 4.0–10.5)

## 2015-05-08 LAB — COMPREHENSIVE METABOLIC PANEL
ALT: 19 U/L (ref 14–54)
AST: 22 U/L (ref 15–41)
Albumin: 3.9 g/dL (ref 3.5–5.0)
Alkaline Phosphatase: 104 U/L (ref 38–126)
Anion gap: 11 (ref 5–15)
BUN: 19 mg/dL (ref 6–20)
CO2: 26 mmol/L (ref 22–32)
Calcium: 9.4 mg/dL (ref 8.9–10.3)
Chloride: 103 mmol/L (ref 101–111)
Creatinine, Ser: 1.57 mg/dL — ABNORMAL HIGH (ref 0.44–1.00)
GFR calc Af Amer: 39 mL/min — ABNORMAL LOW (ref >60)
GFR calc non Af Amer: 34 mL/min — ABNORMAL LOW (ref >60)
Glucose, Bld: 93 mg/dL (ref 65–99)
Potassium: 4.1 mmol/L (ref 3.5–5.1)
Sodium: 140 mmol/L (ref 135–145)
Total Bilirubin: 0.7 mg/dL (ref 0.3–1.2)
Total Protein: 7.4 g/dL (ref 6.5–8.1)

## 2015-05-08 LAB — SURGICAL PCR SCREEN
MRSA, PCR: NEGATIVE
Staphylococcus aureus: NEGATIVE

## 2015-05-08 LAB — APTT: aPTT: 36 seconds (ref 24–37)

## 2015-05-08 LAB — TYPE AND SCREEN
ABO/RH(D): A POS
Antibody Screen: NEGATIVE

## 2015-05-08 LAB — PROTIME-INR
INR: 1.17 (ref 0.00–1.49)
PROTHROMBIN TIME: 15.1 s (ref 11.6–15.2)

## 2015-05-08 LAB — ABO/RH: ABO/RH(D): A POS

## 2015-05-08 NOTE — Progress Notes (Signed)
Based on lab results & EKG of today, requesting anesth. Review this chart. Pt. Denies any previous cardiac issues, denies chest pain or any breathing changes or difficulties

## 2015-05-09 LAB — URINE CULTURE: Culture: 1000

## 2015-05-11 NOTE — Progress Notes (Addendum)
Anesthesia Chart Review: Patient is a 65 year old female scheduled for right TKA on 05/18/15 by Dr. Noemi Chapel.  History includes nonsmoker, hypertension, reflux, GERD, hiatal hernia, pancreatitis (possible drug induced), CKD. BMI is consistent with obesity. PCP is Dr. Luan Pulling in [place]. She saw nephrologist Dr. Lowanda Foster during her 06/2013 admission for pancreatitis with acute kidney injury (peak Cr 2.25), possible dye-induced. GI is Dr. Laural Golden.   She is scheduled to see cardiologist Dr. Johnsie Cancel on 05/12/15 for a pre-operative evaluation.  Meds include amlodipine-benazepril, Lipitor, Benadryl, ibuprofen, krill oil, magnesium oxide, Protonix, Turmeric.   PAT Vitals: BP 173/69, HR 68, RR 20, O2 sat 98%, T 36.7 C.  05/08/15 EKG: NSR, cannot rule out anterior infarct (age undetermined).  Preoperative labs noted. Cr 1.57, appears stable since at least 06/2013. CBC, PT/PTT WNL. Urine culture showed 1000 colonies (insignificant growth).  Patient is seeing Dr. Johnsie Cancel tomorrow. Chart will be left for follow-up regarding his pre-operative recommendations.  George Hugh [facility] Short Stay Center/Anesthesiology Phone [phone] 05/11/2015 5:47 PM  Addendum: Patient seen by Dr. Johnsie Cancel this morning. He felt anterior infarct changes on EKG were due to lead placement and body habitus. He cleared her for upcoming TKA.  PRN cardiology follow-up recommended.  George Hugh [facility] Short Stay Center/Anesthesiology Phone [phone] 05/12/2015 1:39 PM

## 2015-05-12 ENCOUNTER — Encounter: Payer: Self-pay | Admitting: Cardiovascular Disease

## 2015-05-12 ENCOUNTER — Ambulatory Visit (INDEPENDENT_AMBULATORY_CARE_PROVIDER_SITE_OTHER): Payer: BC Managed Care – PPO | Admitting: Cardiovascular Disease

## 2015-05-12 VITALS — BP 170/88 | HR 80 | Ht 67.0 in | Wt 246.1 lb

## 2015-05-12 DIAGNOSIS — Z01810 Encounter for preprocedural cardiovascular examination: Secondary | ICD-10-CM | POA: Diagnosis not present

## 2015-05-12 NOTE — Patient Instructions (Addendum)
Medication Instructions:  Your physician recommends that you continue on your current medications as directed. Please refer to the Current Medication list given to you today.  Labwork: NONE  Testing/Procedures: NONE  Follow-Up: Your physician wants you to follow-up as needed.  If you need a refill on your cardiac medications before your next appointment, please call your pharmacy.   

## 2015-05-12 NOTE — Progress Notes (Signed)
Patient ID: Deanna [name], female   DOB: Sep 15, 1950, 65 y.o.   MRN: [id number]     Cardiology Office Note   Date:  05/12/2015   ID:  Deanna [name], DOB 1951-03-19, MRN [id number]  PCP:  Alonza Bogus, MD  Cardiologist:   Jenkins Rouge, MD   Chief Complaint  Patient presents with  . New Evaluation    cardiac clearance for knee surgery on 05/18/15      History of Present Illness: Deanna [name] is a 65 y.o. female who presents for preop evaluation. Needs right TKR.  History of HTN, CRF, pancreatitis Preop ECG abnormal  Read by computer as possible anterior MI.  To my view it is NSR ate 68 normal. Likely body habitus and high precordial lead placement.  Chronic Rx for HTN under good control.  Activity limited by right knee pain No dyspnea, chest pain palpitations or syncope.  No previous issues with surgery, bleeding or anesthesia.  Started on cholesterol medicine a couple of months ago.      Past Medical History  Diagnosis Date  . HTN (hypertension)   . Reflux   . GERD (gastroesophageal reflux disease)   . Hx of pancreatitis 2015    attributes to Etodolac and Zpack  . Primary localized osteoarthritis of right knee   . History of hiatal hernia   . Chronic kidney disease     pt. reports that during episode of pancreatitis, she experienced decreased kidney function related to contrast dye     Past Surgical History  Procedure Laterality Date  . Foot surgery    . Laparoscopic endometriosis fulguration    . Trigger thumb      rt thumb last year  . Foot surgery Bilateral     bunions  . Knee arthroscopy w/ meniscal repair Left   . Eus N/A 08/15/2013    Procedure: UPPER ENDOSCOPIC ULTRASOUND (EUS) LINEAR;  Surgeon: Milus Banister, MD;  Location: [facility];  Service: Endoscopy;  Laterality: N/A;  . Diagnostic laparoscopy       Current Outpatient Prescriptions  Medication Sig Dispense Refill  . acetaminophen (TYLENOL) 500 MG tablet Take 500 mg by mouth every 6 (six) hours as  needed.    Marland Kitchen amLODipine-benazepril (LOTREL) 5-20 MG per capsule Take 1 capsule by mouth every morning.     Marland Kitchen atorvastatin (LIPITOR) 20 MG tablet Take 20 mg by mouth daily after breakfast.     . Coenzyme Q10 (CO Q 10 PO) Take 200 mg by mouth daily.     . diphenhydrAMINE (BENADRYL) 25 MG tablet Take 25 mg by mouth at bedtime.    . docusate sodium (COLACE) 100 MG capsule Take 100 mg by mouth 2 (two) times daily.    . Glucosamine-Chondroitin (COSAMIN DS PO) Take 1 tablet by mouth 2 (two) times daily.     Marland Kitchen ibuprofen (ADVIL,MOTRIN) 200 MG tablet Take 400 mg by mouth every 6 (six) hours as needed for moderate pain.     Javier Docker Oil 300 MG CAPS Take 300 mg by mouth daily.    . Lactobacillus (ACIDOPHILUS PO) Take 1 tablet by mouth daily.    . magnesium oxide (MAG-OX) 400 MG tablet Take 400 mg by mouth daily. Pt. Takes Magnesium citrate 100mg . Oral pill - daily    . Multiple Vitamin (MULTIVITAMIN) tablet Take 1 tablet by mouth daily.    . pantoprazole (PROTONIX) 40 MG tablet Take 40 mg by mouth 2 (two) times daily.    . TURMERIC  PO Take 1 Dose by mouth 2 (two) times daily.    . vitamin B-12 (CYANOCOBALAMIN) 1000 MCG tablet Take 1,000 mcg by mouth daily.     No current facility-administered medications for this visit.    Allergies:   Contrast media; Dilaudid; Etodolac; Hydrocodone; Oxycodone; Sulfa antibiotics; and Z-pak    Social History:  The patient  reports that she has never smoked. She has never used smokeless tobacco. She reports that she does not drink alcohol or use illicit drugs.   Family History:  The patient's family history includes Aneurysm in her mother; Cancer - Other in her sister; Diabetes in her father; Hypertension in her father.    ROS:  Please see the history of present illness.   Otherwise, review of systems are positive for none.   All other systems are reviewed and negative.    PHYSICAL EXAM: VS:  BP 170/88 mmHg  Pulse 80  Ht 5\' 7"  (1.702 m)  Wt 111.639 kg (246 lb 1.9  oz)  BMI 38.54 kg/m2  SpO2 96% , BMI Body mass index is 38.54 kg/(m^2). Affect appropriate Obese white female  HEENT: normal Neck supple with no adenopathy JVP normal no bruits no thyromegaly Lungs clear with no wheezing and good diaphragmatic motion Heart:  S1/S2 no murmur, no rub, gallop or click PMI normal Abdomen: benighn, BS positve, no tenderness, no AAA no bruit.  No HSM or HJR Distal pulses intact with no bruits No edema Neuro non-focal Skin warm and dry No muscular weakness    EKG:  NSR rate 68 normal    Recent Labs: 05/08/2015: ALT 19; BUN 19; Creatinine, Ser 1.57*; Hemoglobin 13.2; Platelets 372; Potassium 4.1; Sodium 140    Lipid Panel    Component Value Date/Time   TRIG 132 06/25/2013 2153      Wt Readings from Last 3 Encounters:  05/12/15 111.639 kg (246 lb 1.9 oz)  05/08/15 113.399 kg (250 lb)  05/01/15 112.946 kg (249 lb)      Other studies Reviewed: Additional studies/ records that were reviewed today include: An.    ASSESSMENT AND PLAN:  1.  Preop:  Asymptomatic ECG normal read by computer as anterior MI but just lead placement and body habitus  Clear to have right TKR Monday 2. HTN:  Well controlled.  Continue current medications and low sodium Dash type diet.   3. Cholesterol is at goal.  Continue current dose of statin and diet Rx.  No myalgias or side effects.  F/U  LFT's in 6 months. No results found for: [facility]  Labs with Dr Luan Pulling just started on statin              Current medicines are reviewed at length with the patient today.  The patient does not have concerns regarding medicines.  The following changes have been made:  no change  Labs/ tests ordered today include: None  No orders of the defined types were placed in this encounter.     Disposition:   FU with me PRN     Signed, Jenkins Rouge, MD  05/12/2015 9:23 AM    [address], [place], [place]  [address] Phone: [phone]; Fax: [phone]

## 2015-05-15 MED ORDER — CEFAZOLIN SODIUM-DEXTROSE 2-3 GM-% IV SOLR
2.0000 g | INTRAVENOUS | Status: AC
  Administered 2015-05-18: 2 g via INTRAVENOUS
  Filled 2015-05-15: qty 50

## 2015-05-15 MED ORDER — LACTATED RINGERS IV SOLN
INTRAVENOUS | Status: DC
  Administered 2015-05-18 (×3): via INTRAVENOUS

## 2015-05-15 MED ORDER — POVIDONE-IODINE 7.5 % EX SOLN
Freq: Once | CUTANEOUS | Status: DC
  Filled 2015-05-15: qty 118

## 2015-05-15 MED ORDER — CHLORHEXIDINE GLUCONATE 4 % EX LIQD: 60.0000 mL | Freq: Once | CUTANEOUS | Status: DC

## 2015-05-18 ENCOUNTER — Inpatient Hospital Stay (HOSPITAL_COMMUNITY): Payer: BC Managed Care – PPO | Admitting: Certified Registered Nurse Anesthetist

## 2015-05-18 ENCOUNTER — Inpatient Hospital Stay (HOSPITAL_COMMUNITY): Payer: BC Managed Care – PPO | Admitting: Vascular Surgery

## 2015-05-18 ENCOUNTER — Encounter (HOSPITAL_COMMUNITY)
Admission: RE | Disposition: A | Discharge status: Home Health | Payer: Self-pay | Source: Ambulatory Visit | Attending: Orthopedic Surgery

## 2015-05-18 ENCOUNTER — Inpatient Hospital Stay (HOSPITAL_COMMUNITY)
Admission: RE | Admit: 2015-05-18 | Discharge: 2015-05-21 | DRG: 470 | Disposition: A | Discharge status: Home Health | Payer: BC Managed Care – PPO | Source: Ambulatory Visit | Attending: Orthopedic Surgery | Admitting: Orthopedic Surgery

## 2015-05-18 ENCOUNTER — Encounter (HOSPITAL_COMMUNITY): Payer: Self-pay | Admitting: *Deleted

## 2015-05-18 DIAGNOSIS — K219 Gastro-esophageal reflux disease without esophagitis: Secondary | ICD-10-CM | POA: Diagnosis present

## 2015-05-18 DIAGNOSIS — Z8249 Family history of ischemic heart disease and other diseases of the circulatory system: Secondary | ICD-10-CM

## 2015-05-18 DIAGNOSIS — I129 Hypertensive chronic kidney disease with stage 1 through stage 4 chronic kidney disease, or unspecified chronic kidney disease: Secondary | ICD-10-CM | POA: Diagnosis present

## 2015-05-18 DIAGNOSIS — N183 Chronic kidney disease, stage 3 unspecified: Secondary | ICD-10-CM | POA: Diagnosis present

## 2015-05-18 DIAGNOSIS — M1711 Unilateral primary osteoarthritis, right knee: Secondary | ICD-10-CM | POA: Diagnosis present

## 2015-05-18 DIAGNOSIS — M179 Osteoarthritis of knee, unspecified: Secondary | ICD-10-CM | POA: Diagnosis present

## 2015-05-18 DIAGNOSIS — M1710 Unilateral primary osteoarthritis, unspecified knee: Secondary | ICD-10-CM | POA: Diagnosis present

## 2015-05-18 DIAGNOSIS — E669 Obesity, unspecified: Secondary | ICD-10-CM | POA: Diagnosis present

## 2015-05-18 DIAGNOSIS — Z6838 Body mass index (BMI) 38.0-38.9, adult: Secondary | ICD-10-CM

## 2015-05-18 DIAGNOSIS — Z8719 Personal history of other diseases of the digestive system: Secondary | ICD-10-CM

## 2015-05-18 DIAGNOSIS — Z7952 Long term (current) use of systemic steroids: Secondary | ICD-10-CM | POA: Diagnosis not present

## 2015-05-18 DIAGNOSIS — Z881 Allergy status to other antibiotic agents status: Secondary | ICD-10-CM

## 2015-05-18 DIAGNOSIS — D72829 Elevated white blood cell count, unspecified: Secondary | ICD-10-CM

## 2015-05-18 DIAGNOSIS — M25561 Pain in right knee: Secondary | ICD-10-CM | POA: Diagnosis present

## 2015-05-18 DIAGNOSIS — I10 Essential (primary) hypertension: Secondary | ICD-10-CM | POA: Diagnosis present

## 2015-05-18 DIAGNOSIS — Z79899 Other long term (current) drug therapy: Secondary | ICD-10-CM

## 2015-05-18 HISTORY — PX: TOTAL KNEE ARTHROPLASTY: SHX125

## 2015-05-18 HISTORY — DX: Chronic kidney disease, stage 3 (moderate): N18.3

## 2015-05-18 HISTORY — DX: Unilateral primary osteoarthritis, right knee: M17.11

## 2015-05-18 LAB — CBC
HCT: 37.5 % (ref 36.0–46.0)
HEMOGLOBIN: 12.8 g/dL (ref 12.0–15.0)
MCH: 31.5 pg (ref 26.0–34.0)
MCHC: 34.1 g/dL (ref 30.0–36.0)
MCV: 92.4 fL (ref 78.0–100.0)
PLATELETS: 330 10*3/uL (ref 150–400)
RBC: 4.06 MIL/uL (ref 3.87–5.11)
RDW: 13.3 % (ref 11.5–15.5)
WBC: 19.4 10*3/uL — ABNORMAL HIGH (ref 4.0–10.5)

## 2015-05-18 LAB — CREATININE, SERUM
CREATININE: 1.42 mg/dL — AB (ref 0.44–1.00)
GFR calc Af Amer: 44 mL/min — ABNORMAL LOW (ref >60)
GFR calc non Af Amer: 38 mL/min — ABNORMAL LOW (ref >60)

## 2015-05-18 SURGERY — ARTHROPLASTY, KNEE, TOTAL
Anesthesia: Spinal | Site: Knee | Laterality: Right

## 2015-05-18 MED ORDER — PANTOPRAZOLE SODIUM 40 MG PO TBEC
40.0000 mg | DELAYED_RELEASE_TABLET | Freq: Two times a day (BID) | ORAL | Status: DC
  Administered 2015-05-18: 40 mg via ORAL
  Filled 2015-05-18: qty 1

## 2015-05-18 MED ORDER — ACETAMINOPHEN 650 MG RE SUPP: 650.0000 mg | Freq: Four times a day (QID) | RECTAL | Status: DC | PRN

## 2015-05-18 MED ORDER — CEFAZOLIN SODIUM-DEXTROSE 2-3 GM-% IV SOLR
2.0000 g | Freq: Four times a day (QID) | INTRAVENOUS | Status: AC
  Administered 2015-05-18 – 2015-05-19 (×2): 2 g via INTRAVENOUS
  Filled 2015-05-18 (×2): qty 50

## 2015-05-18 MED ORDER — ROCURONIUM BROMIDE 50 MG/5ML IV SOLN
INTRAVENOUS | Status: AC
  Filled 2015-05-18: qty 1

## 2015-05-18 MED ORDER — MIDAZOLAM HCL 5 MG/5ML IJ SOLN
INTRAMUSCULAR | Status: DC | PRN
  Administered 2015-05-18 (×2): 1 mg via INTRAVENOUS

## 2015-05-18 MED ORDER — LIDOCAINE HCL (CARDIAC) 20 MG/ML IV SOLN
INTRAVENOUS | Status: AC
  Filled 2015-05-18: qty 5

## 2015-05-18 MED ORDER — OXYCODONE HCL 5 MG PO TABS
5.0000 mg | ORAL_TABLET | ORAL | Status: DC | PRN
  Administered 2015-05-18: 5 mg via ORAL
  Administered 2015-05-18 – 2015-05-21 (×18): 10 mg via ORAL
  Filled 2015-05-18 (×20): qty 2

## 2015-05-18 MED ORDER — DOCUSATE SODIUM 100 MG PO CAPS
100.0000 mg | ORAL_CAPSULE | Freq: Two times a day (BID) | ORAL | Status: DC
  Administered 2015-05-18 – 2015-05-21 (×6): 100 mg via ORAL
  Filled 2015-05-18 (×6): qty 1

## 2015-05-18 MED ORDER — ONDANSETRON HCL 4 MG/2ML IJ SOLN: 4.0000 mg | Freq: Four times a day (QID) | INTRAMUSCULAR | Status: DC | PRN

## 2015-05-18 MED ORDER — SODIUM CHLORIDE 0.9 % IR SOLN
Status: DC | PRN
Start: 2015-05-18 — End: 2015-05-18
  Administered 2015-05-18: 1000 mL

## 2015-05-18 MED ORDER — BUPIVACAINE-EPINEPHRINE (PF) 0.25% -1:200000 IJ SOLN
INTRAMUSCULAR | Status: AC
  Filled 2015-05-18: qty 30

## 2015-05-18 MED ORDER — METOCLOPRAMIDE HCL 5 MG/ML IJ SOLN: 5.0000 mg | Freq: Three times a day (TID) | INTRAMUSCULAR | Status: DC | PRN

## 2015-05-18 MED ORDER — DEXAMETHASONE SODIUM PHOSPHATE 10 MG/ML IJ SOLN
INTRAMUSCULAR | Status: DC | PRN
  Administered 2015-05-18: 10 mg via INTRAVENOUS

## 2015-05-18 MED ORDER — DEXAMETHASONE SODIUM PHOSPHATE 10 MG/ML IJ SOLN
INTRAMUSCULAR | Status: AC
  Filled 2015-05-18: qty 1

## 2015-05-18 MED ORDER — LIDOCAINE HCL (CARDIAC) 20 MG/ML IV SOLN
INTRAVENOUS | Status: DC | PRN
  Administered 2015-05-18: 50 mg via INTRATRACHEAL

## 2015-05-18 MED ORDER — MENTHOL 3 MG MT LOZG: 1.0000 | LOZENGE | OROMUCOSAL | Status: DC | PRN

## 2015-05-18 MED ORDER — ENOXAPARIN SODIUM 30 MG/0.3ML ~~LOC~~ SOLN
30.0000 mg | SUBCUTANEOUS | Status: DC
  Administered 2015-05-19 – 2015-05-21 (×3): 30 mg via SUBCUTANEOUS
  Filled 2015-05-18 (×3): qty 0.3

## 2015-05-18 MED ORDER — POTASSIUM CHLORIDE IN NACL 20-0.9 MEQ/L-% IV SOLN
INTRAVENOUS | Status: DC
  Administered 2015-05-18: 100 mL/h via INTRAVENOUS
  Administered 2015-05-19: 03:00:00 via INTRAVENOUS
  Filled 2015-05-18 (×4): qty 1000

## 2015-05-18 MED ORDER — FENTANYL CITRATE (PF) 100 MCG/2ML IJ SOLN
INTRAMUSCULAR | Status: AC
  Filled 2015-05-18: qty 2

## 2015-05-18 MED ORDER — ARTIFICIAL TEARS OP OINT
TOPICAL_OINTMENT | OPHTHALMIC | Status: AC
  Filled 2015-05-18: qty 3.5

## 2015-05-18 MED ORDER — SUCCINYLCHOLINE CHLORIDE 20 MG/ML IJ SOLN
INTRAMUSCULAR | Status: AC
  Filled 2015-05-18: qty 1

## 2015-05-18 MED ORDER — NEOSTIGMINE METHYLSULFATE 10 MG/10ML IV SOLN
INTRAVENOUS | Status: AC
  Filled 2015-05-18: qty 1

## 2015-05-18 MED ORDER — MEPERIDINE HCL 25 MG/ML IJ SOLN: 6.2500 mg | INTRAMUSCULAR | Status: DC | PRN

## 2015-05-18 MED ORDER — FENTANYL CITRATE (PF) 250 MCG/5ML IJ SOLN
INTRAMUSCULAR | Status: AC
  Filled 2015-05-18: qty 5

## 2015-05-18 MED ORDER — ONDANSETRON HCL 4 MG PO TABS: 4.0000 mg | ORAL_TABLET | Freq: Four times a day (QID) | ORAL | Status: DC | PRN

## 2015-05-18 MED ORDER — HYDROMORPHONE HCL 1 MG/ML IJ SOLN: 1.0000 mg | INTRAMUSCULAR | Status: DC | PRN

## 2015-05-18 MED ORDER — BUPIVACAINE-EPINEPHRINE 0.25% -1:200000 IJ SOLN
INTRAMUSCULAR | Status: DC | PRN
  Administered 2015-05-18: 30 mL

## 2015-05-18 MED ORDER — POLYETHYLENE GLYCOL 3350 17 G PO PACK
17.0000 g | PACK | Freq: Two times a day (BID) | ORAL | Status: DC
  Administered 2015-05-18 – 2015-05-21 (×6): 17 g via ORAL
  Filled 2015-05-18 (×6): qty 1

## 2015-05-18 MED ORDER — DIPHENHYDRAMINE HCL 12.5 MG/5ML PO ELIX
12.5000 mg | ORAL_SOLUTION | ORAL | Status: DC | PRN
  Administered 2015-05-21: 12.5 mg via ORAL
  Filled 2015-05-18: qty 10

## 2015-05-18 MED ORDER — FENTANYL CITRATE (PF) 100 MCG/2ML IJ SOLN
INTRAMUSCULAR | Status: DC | PRN
  Administered 2015-05-18: 25 ug via INTRAVENOUS
  Administered 2015-05-18 (×2): 50 ug via INTRAVENOUS
  Administered 2015-05-18: 25 ug via INTRAVENOUS

## 2015-05-18 MED ORDER — MIDAZOLAM HCL 2 MG/2ML IJ SOLN
2.0000 mg | Freq: Once | INTRAMUSCULAR | Status: AC
  Administered 2015-05-18: 2 mg via INTRAVENOUS
  Filled 2015-05-18: qty 2

## 2015-05-18 MED ORDER — PROPOFOL 10 MG/ML IV BOLUS
INTRAVENOUS | Status: DC | PRN
  Administered 2015-05-18 (×3): 20 mg via INTRAVENOUS

## 2015-05-18 MED ORDER — ATORVASTATIN CALCIUM 20 MG PO TABS
20.0000 mg | ORAL_TABLET | Freq: Every day | ORAL | Status: DC
  Administered 2015-05-19 – 2015-05-21 (×3): 20 mg via ORAL
  Filled 2015-05-18 (×3): qty 1

## 2015-05-18 MED ORDER — ONDANSETRON HCL 4 MG/2ML IJ SOLN
INTRAMUSCULAR | Status: DC | PRN
  Administered 2015-05-18: 4 mg via INTRAVENOUS

## 2015-05-18 MED ORDER — FENTANYL CITRATE (PF) 100 MCG/2ML IJ SOLN
100.0000 ug | Freq: Once | INTRAMUSCULAR | Status: AC
  Administered 2015-05-18: 100 ug via INTRAVENOUS
  Filled 2015-05-18: qty 2

## 2015-05-18 MED ORDER — BUPIVACAINE HCL (PF) 0.5 % IJ SOLN
INTRAMUSCULAR | Status: DC | PRN
  Administered 2015-05-18: 30 mL via PERINEURAL

## 2015-05-18 MED ORDER — PROPOFOL 500 MG/50ML IV EMUL
INTRAVENOUS | Status: DC | PRN
  Administered 2015-05-18: 50 ug/kg/min via INTRAVENOUS

## 2015-05-18 MED ORDER — PHENOL 1.4 % MT LIQD: 1.0000 | OROMUCOSAL | Status: DC | PRN

## 2015-05-18 MED ORDER — DEXAMETHASONE SODIUM PHOSPHATE 10 MG/ML IJ SOLN
10.0000 mg | Freq: Three times a day (TID) | INTRAMUSCULAR | Status: AC
  Administered 2015-05-18 – 2015-05-20 (×4): 10 mg via INTRAVENOUS
  Filled 2015-05-18 (×5): qty 1

## 2015-05-18 MED ORDER — AMLODIPINE BESY-BENAZEPRIL HCL 5-20 MG PO CAPS: 1.0000 | ORAL_CAPSULE | Freq: Every morning | ORAL | Status: DC

## 2015-05-18 MED ORDER — ACETAMINOPHEN 325 MG PO TABS: 650.0000 mg | ORAL_TABLET | Freq: Four times a day (QID) | ORAL | Status: DC | PRN

## 2015-05-18 MED ORDER — METOCLOPRAMIDE HCL 5 MG PO TABS: 5.0000 mg | ORAL_TABLET | Freq: Three times a day (TID) | ORAL | Status: DC | PRN

## 2015-05-18 MED ORDER — GLYCOPYRROLATE 0.2 MG/ML IJ SOLN
INTRAMUSCULAR | Status: AC
  Filled 2015-05-18: qty 3

## 2015-05-18 MED ORDER — FENTANYL CITRATE (PF) 100 MCG/2ML IJ SOLN
INTRAMUSCULAR | Status: AC
  Administered 2015-05-18: 100 ug via INTRAVENOUS
  Filled 2015-05-18: qty 2

## 2015-05-18 MED ORDER — ALUM & MAG HYDROXIDE-SIMETH 200-200-20 MG/5ML PO SUSP: 30.0000 mL | ORAL | Status: DC | PRN

## 2015-05-18 MED ORDER — OXYCODONE HCL 5 MG PO TABS
ORAL_TABLET | ORAL | Status: AC
  Filled 2015-05-18: qty 1

## 2015-05-18 MED ORDER — SODIUM CHLORIDE 0.9 % IR SOLN
Status: DC | PRN
  Administered 2015-05-18 (×3): 1000 mL

## 2015-05-18 MED ORDER — ONDANSETRON HCL 4 MG/2ML IJ SOLN
INTRAMUSCULAR | Status: AC
  Filled 2015-05-18: qty 2

## 2015-05-18 MED ORDER — PROMETHAZINE HCL 25 MG/ML IJ SOLN: 6.2500 mg | INTRAMUSCULAR | Status: DC | PRN

## 2015-05-18 MED ORDER — MIDAZOLAM HCL 2 MG/2ML IJ SOLN
INTRAMUSCULAR | Status: AC
  Administered 2015-05-18: 2 mg via INTRAVENOUS
  Filled 2015-05-18: qty 2

## 2015-05-18 MED ORDER — MIDAZOLAM HCL 2 MG/2ML IJ SOLN
INTRAMUSCULAR | Status: AC
  Filled 2015-05-18: qty 2

## 2015-05-18 MED ORDER — PROPOFOL 10 MG/ML IV BOLUS
INTRAVENOUS | Status: AC
  Filled 2015-05-18: qty 20

## 2015-05-18 MED ORDER — FENTANYL CITRATE (PF) 100 MCG/2ML IJ SOLN
25.0000 ug | INTRAMUSCULAR | Status: DC | PRN
  Administered 2015-05-18: 25 ug via INTRAVENOUS
  Administered 2015-05-18 (×2): 50 ug via INTRAVENOUS

## 2015-05-18 SURGICAL SUPPLY — 85 items
APL SKNCLS STERI-STRIP NONHPOA (GAUZE/BANDAGES/DRESSINGS) ×1
BANDAGE ESMARK 6X9 LF (GAUZE/BANDAGES/DRESSINGS) ×1 IMPLANT
BENZOIN TINCTURE PRP APPL 2/3 (GAUZE/BANDAGES/DRESSINGS) ×3 IMPLANT
BLADE SAGITTAL 25.0X1.19X90 (BLADE) ×2 IMPLANT
BLADE SAGITTAL 25.0X1.19X90MM (BLADE) ×1
BLADE SAW RECIP 87.9 MT (BLADE) ×4 IMPLANT
BLADE SAW SAG 90X13X1.27 (BLADE) ×3 IMPLANT
BLADE SURG 10 STRL SS (BLADE) ×6 IMPLANT
BNDG CMPR 9X6 STRL LF SNTH (GAUZE/BANDAGES/DRESSINGS) ×1
BNDG CMPR MED 15X6 ELC VLCR LF (GAUZE/BANDAGES/DRESSINGS) ×1
BNDG ELASTIC 6X15 VLCR STRL LF (GAUZE/BANDAGES/DRESSINGS) ×3 IMPLANT
BNDG ESMARK 6X9 LF (GAUZE/BANDAGES/DRESSINGS) ×3
BOWL SMART MIX CTS (DISPOSABLE) ×3 IMPLANT
CAPT KNEE TOTAL 3 ATTUNE ×2 IMPLANT
CEMENT HV SMART SET (Cement) ×6 IMPLANT
CLOSURE WOUND 1/2 X4 (GAUZE/BANDAGES/DRESSINGS) ×1
COVER SURGICAL LIGHT HANDLE (MISCELLANEOUS) ×3 IMPLANT
CUFF TOURNIQUET SINGLE 34IN LL (TOURNIQUET CUFF) ×3 IMPLANT
CUFF TOURNIQUET SINGLE 44IN (TOURNIQUET CUFF) IMPLANT
DECANTER SPIKE VIAL GLASS SM (MISCELLANEOUS) ×1 IMPLANT
DRAPE EXTREMITY T 121X128X90 (DRAPE) ×3 IMPLANT
DRAPE INCISE IOBAN 66X45 STRL (DRAPES) ×3 IMPLANT
DRAPE PROXIMA HALF (DRAPES) ×3 IMPLANT
DRAPE U-SHAPE 47X51 STRL (DRAPES) ×3 IMPLANT
DRSG AQUACEL AG ADV 3.5X10 (GAUZE/BANDAGES/DRESSINGS) ×3 IMPLANT
DRSG AQUACEL AG ADV 3.5X14 (GAUZE/BANDAGES/DRESSINGS) ×3 IMPLANT
DRSG MEPILEX BORDER 4X4 (GAUZE/BANDAGES/DRESSINGS) ×2 IMPLANT
DURAPREP 26ML APPLICATOR (WOUND CARE) ×6 IMPLANT
ELECT CAUTERY BLADE 6.4 (BLADE) ×3 IMPLANT
ELECT REM PT RETURN 9FT ADLT (ELECTROSURGICAL) ×3
ELECTRODE REM PT RTRN 9FT ADLT (ELECTROSURGICAL) ×1 IMPLANT
FACESHIELD STD STERILE (MASK) ×2 IMPLANT
FACESHIELD WRAPAROUND (MASK) ×3 IMPLANT
FACESHIELD WRAPAROUND OR TEAM (MASK) ×1 IMPLANT
GAUZE SPONGE 4X4 12PLY STRL (GAUZE/BANDAGES/DRESSINGS) ×3 IMPLANT
GLOVE BIO SURGEON STRL SZ7 (GLOVE) ×9 IMPLANT
GLOVE BIOGEL PI IND STRL 7.0 (GLOVE) ×1 IMPLANT
GLOVE BIOGEL PI IND STRL 7.5 (GLOVE) ×1 IMPLANT
GLOVE BIOGEL PI IND STRL 8.5 (GLOVE) IMPLANT
GLOVE BIOGEL PI INDICATOR 7.0 (GLOVE) ×6
GLOVE BIOGEL PI INDICATOR 7.5 (GLOVE) ×2
GLOVE BIOGEL PI INDICATOR 8.5 (GLOVE) ×2
GLOVE BIOGEL PI ORTHO PRO SZ7 (GLOVE) ×2
GLOVE PI ORTHO PRO STRL SZ7 (GLOVE) IMPLANT
GLOVE SS BIOGEL STRL SZ 7.5 (GLOVE) ×1 IMPLANT
GLOVE SUPERSENSE BIOGEL SZ 7.5 (GLOVE) ×2
GOWN STRL REUS W/ TWL LRG LVL3 (GOWN DISPOSABLE) ×1 IMPLANT
GOWN STRL REUS W/ TWL XL LVL3 (GOWN DISPOSABLE) ×2 IMPLANT
GOWN STRL REUS W/TWL LRG LVL3 (GOWN DISPOSABLE) ×3
GOWN STRL REUS W/TWL XL LVL3 (GOWN DISPOSABLE) ×12
HANDPIECE INTERPULSE COAX TIP (DISPOSABLE) ×3
HOOD PEEL AWAY FACE SHEILD DIS (HOOD) ×8 IMPLANT
HOOD PEEL AWAY FLYTE STAYCOOL (MISCELLANEOUS) ×4 IMPLANT
IMMOBILIZER KNEE 22 UNIV (SOFTGOODS) ×3 IMPLANT
KIT BASIN OR (CUSTOM PROCEDURE TRAY) ×3 IMPLANT
KIT ROOM TURNOVER OR (KITS) ×3 IMPLANT
MANIFOLD NEPTUNE II (INSTRUMENTS) ×3 IMPLANT
MARKER SKIN DUAL TIP RULER LAB (MISCELLANEOUS) ×3 IMPLANT
NDL 18GX1X1/2 (RX/OR ONLY) (NEEDLE) ×1 IMPLANT
NEEDLE 18GX1X1/2 (RX/OR ONLY) (NEEDLE) ×3 IMPLANT
NS IRRIG 1000ML POUR BTL (IV SOLUTION) ×3 IMPLANT
PACK TOTAL JOINT (CUSTOM PROCEDURE TRAY) ×3 IMPLANT
PACK UNIVERSAL I (CUSTOM PROCEDURE TRAY) ×3 IMPLANT
PAD ARMBOARD 7.5X6 YLW CONV (MISCELLANEOUS) ×6 IMPLANT
PENCIL BUTTON HOLSTER BLD 10FT (ELECTRODE) ×2 IMPLANT
SET HNDPC FAN SPRY TIP SCT (DISPOSABLE) ×1 IMPLANT
SPONGE GAUZE 4X4 12PLY STER LF (GAUZE/BANDAGES/DRESSINGS) ×2 IMPLANT
STRIP CLOSURE SKIN 1/2X4 (GAUZE/BANDAGES/DRESSINGS) ×2 IMPLANT
SUCTION FRAZIER HANDLE 10FR (MISCELLANEOUS) ×2
SUCTION FRAZIER TIP 10 FR DISP (SUCTIONS) ×3 IMPLANT
SUCTION TUBE FRAZIER 10FR DISP (MISCELLANEOUS) IMPLANT
SUT MNCRL AB 3-0 PS2 18 (SUTURE) ×3 IMPLANT
SUT VIC AB 0 CT1 27 (SUTURE) ×6
SUT VIC AB 0 CT1 27XBRD ANBCTR (SUTURE) ×2 IMPLANT
SUT VIC AB 1 CT1 27 (SUTURE) ×3
SUT VIC AB 1 CT1 27XBRD ANBCTR (SUTURE) ×1 IMPLANT
SUT VIC AB 2-0 CT1 27 (SUTURE) ×6
SUT VIC AB 2-0 CT1 TAPERPNT 27 (SUTURE) ×2 IMPLANT
SYR 30ML SLIP (SYRINGE) ×3 IMPLANT
TOWEL OR 17X24 6PK STRL BLUE (TOWEL DISPOSABLE) ×3 IMPLANT
TOWEL OR 17X26 10 PK STRL BLUE (TOWEL DISPOSABLE) ×3 IMPLANT
TRAY FOLEY CATH 16FR SILVER (SET/KITS/TRAYS/PACK) ×3 IMPLANT
TUBE CONNECTING 12'X1/4 (SUCTIONS) ×1
TUBE CONNECTING 12X1/4 (SUCTIONS) ×2 IMPLANT
YANKAUER SUCT BULB TIP NO VENT (SUCTIONS) ×3 IMPLANT

## 2015-05-18 NOTE — Transfer of Care (Signed)
Immediate Anesthesia Transfer of Care Note  Patient: Deanna [name]  Procedure(s) Performed: Procedure(s): TOTAL KNEE ARTHROPLASTY; RIGHT KNEE (Right)  Patient Location: PACU  Anesthesia Type:MAC and Spinal  Level of Consciousness: alert , oriented, patient cooperative and responds to stimulation  Airway & Oxygen Therapy: Patient Spontanous Breathing and Patient connected to nasal cannula oxygen  Post-op Assessment: Report given to RN and Post -op Vital signs reviewed and stable  Post vital signs: Reviewed and stable  Last Vitals:  Filed Vitals:   05/18/15 0857 05/18/15 0858  BP:    Pulse: 62 63  Temp:    Resp: 13 15    Complications: No apparent anesthesia complications

## 2015-05-18 NOTE — Interval H&P Note (Signed)
History and Physical Interval Note:  05/18/2015 8:50 AM  Deanna [name]  has presented today for surgery, with the diagnosis of PRIMARY LOCALIZED OA RIGHT KNEE  The various methods of treatment have been discussed with the patient and family. After consideration of risks, benefits and other options for treatment, the patient has consented to  Procedure(s): TOTAL KNEE ARTHROPLASTY (Right) as a surgical intervention .  The patient's history has been reviewed, patient examined, no change in status, stable for surgery.  I have reviewed the patient's chart and labs.  Questions were answered to the patient's satisfaction.     Elsie Saas A

## 2015-05-18 NOTE — Op Note (Signed)
MRN:     [id number] DOB/AGE:    1950/11/23 / 65 y.o.       OPERATIVE REPORT    DATE OF PROCEDURE:  05/18/2015       PREOPERATIVE DIAGNOSIS:   PRIMARY LOCALIZED OA RIGHT KNEE      Estimated body mass index is 38.52 kg/(m^2) as calculated from the following:   Height as of this encounter: 5\' 7"  (1.702 m).   Weight as of this encounter: 111.585 kg (246 lb).                                                        POSTOPERATIVE DIAGNOSIS:   SAME                                                                      PROCEDURE:  Procedure(s): TOTAL KNEE ARTHROPLASTY; RIGHT KNEE Using Depuy Attune RP implants #6 Femur, #6Tibia, 56mm  RP bearing, 35 Patella     SURGEON: Deanna [name]    ASSISTANT:  Kirstin Shepperson PA-C   (Present and scrubbed throughout the case, critical for assistance with exposure, retraction, instrumentation, and closure.)         ANESTHESIA: Spinal with Adductor Nerve Block     TOURNIQUET TIME: [id number]   COMPLICATIONS:  None     SPECIMENS: None   INDICATIONS FOR PROCEDURE: The patient has  DJD RIGHT KNEE, varus deformities, XR shows bone on bone arthritis. Patient has failed all conservative measures including anti-inflammatory medicines, narcotics, attempts at  exercise and weight loss, cortisone injections and viscosupplementation.  Risks and benefits of surgery have been discussed, questions answered.   DESCRIPTION OF PROCEDURE: The patient identified by armband, received  right femoral nerve block and IV antibiotics, in the holding area at [facility]. Patient taken to the operating room, appropriate anesthetic  monitors were attached General endotracheal anesthesia induced with  the patient in supine position, Foley catheter was inserted. Tourniquet  applied high to the operative thigh. Lateral post and foot positioner  applied to the table, the lower extremity was then prepped and draped  in usual sterile fashion from the ankle to the tourniquet. Time-out  procedure was performed. The limb was wrapped with an Esmarch bandage and the tourniquet inflated to 365 mmHg. We began the operation by making the anterior midline incision starting at handbreadth above the patella going over the patella 1 cm medial to and  4 cm distal to the tibial tubercle. Small bleeders in the skin and the  subcutaneous tissue identified and cauterized. Transverse retinaculum was incised and reflected medially and [name] medial parapatellar arthrotomy was accomplished. the patella was everted and theprepatellar fat pad resected. The superficial medial collateral  ligament was then elevated from anterior to posterior along the proximal  flare of the tibia and anterior half of the menisci resected. The knee was hyperflexed exposing bone on bone arthritis. Peripheral and notch osteophytes as well as the cruciate ligaments were then resected. We continued to  work our way around posteriorly along the proximal tibia, and externally  rotated the tibia  subluxing it out from underneath the femur. [name] McHale  retractor was placed through the notch and [name] lateral Hohmann retractor  placed, and we then drilled through the proximal tibia in line with the  axis of the tibia followed by an intramedullary guide rod and 2-degree  posterior slope cutting guide. The tibial cutting guide was pinned into place  allowing resection of 4 mm of bone medially and about 5 mm of bone  laterally because of her varus deformity. Satisfied with the tibial resection, we then  entered the distal femur 2 mm anterior to the PCL origin with the  intramedullary guide rod and applied the distal femoral cutting guide  set at 77mm, with 5 degrees of valgus. This was pinned along the  epicondylar axis. At this point, the distal femoral cut was accomplished without difficulty. We then sized for [name] #6 femoral component and pinned the guide in 3 degrees of external rotation.The chamfer cutting guide was pinned into place. The  anterior, posterior, and chamfer cuts were accomplished without difficulty followed by  the  RP box cutting guide and the box cut. We also removed posterior osteophytes from the posterior femoral condyles. At this  time, the knee was brought into full extension. We checked our  extension and flexion gaps and found them symmetric at 68mm.  The patella thickness measured at 25 mm. We set the cutting guide at 15 and removed the posterior 9.5-10 mm  of the patella sized for 35 button and drilled the lollipop. The knee  was then once again hyperflexed exposing the proximal tibia. We sized for [name] #6 tibial base plate, applied the smokestack and the conical reamer followed by the the Delta fin keel punch. We then hammered into place the  RP trial femoral component, inserted [name] 1 trial bearing, trial patellar button, and took the knee through range of motion from 0-130 degrees. No thumb pressure was required for patellar  tracking. At this point, all trial components were removed, [name] double batch of DePuy HV cement  was mixed and applied to all bony metallic mating surfaces except for the posterior condyles of the femur itself. In order, we  hammered into place the tibial tray and removed excess cement, the femoral component and removed excess cement, [name] 45mm  RP bearing  was inserted, and the knee brought to full extension with compression.  The patellar button was clamped into place, and excess cement  removed. While the cement cured the wound was irrigated out with normal saline solution pulse lavage.. Ligament stability and patellar tracking were checked and found to be excellent.. The parapatellar arthrotomy was closed with  #1 Vicryl suture. The subcutaneous tissue with 0 and 2-0 undyed  Vicryl suture, and 4-0 Monocryl.. [name] dressing of Aquaseal,  4 x 4, dressing sponges, Webril, and Ace wrap applied. Needle and sponge count were correct times 2.The patient awakened, extubated, and taken to recovery room without  difficulty. Vascular status was normal, pulses 2+ and symmetric.   Deanna [name] [name] 05/18/2015, 11:19 AM

## 2015-05-18 NOTE — Anesthesia Procedure Notes (Signed)
Anesthesia Regional Block:  Adductor canal block  Pre-Anesthetic Checklist: ,, timeout performed, Correct Patient, Correct Site, Correct Laterality, Correct Procedure, Correct Position, site marked, Risks and benefits discussed, Surgical consent,  Pre-op evaluation,  Post-op pain management  Laterality: Right  Prep: chloraprep       Needles:  Injection technique: Single-shot  Needle Type: Stimiplex     Needle Length: 9cm 9 cm Needle Gauge: 21 and 21 G    Additional Needles:  Procedures: ultrasound guided (picture in chart) Adductor canal block Narrative:  Injection made incrementally with aspirations every 5 mL.  Performed by: Personally  Anesthesiologist: Denario Bagot  Additional Notes: BP cuff, EKG monitors applied. Sedation begun. Artery and nerve location verified with U/S and anesthetic injected incrementally, slowly, and after negative aspirations under direct u/s guidance. Good fascial /perineural spread. Tolerated well.      

## 2015-05-18 NOTE — Anesthesia Preprocedure Evaluation (Signed)
Anesthesia Evaluation  Patient identified by MRN, date of birth, ID band Patient awake    Reviewed: Allergy & Precautions, H&P , NPO status , Patient's Chart, lab work & pertinent test results  Airway Mallampati: II  TM Distance: >3 FB Neck ROM: Full    Dental no notable dental hx.    Pulmonary neg pulmonary ROS,    Pulmonary exam normal breath sounds clear to auscultation       Cardiovascular hypertension, Pt. on medications Normal cardiovascular exam Rhythm:Regular Rate:Normal     Neuro/Psych negative neurological ROS  negative psych ROS   GI/Hepatic Neg liver ROS, hiatal hernia, GERD  ,  Endo/Other  negative endocrine ROS  Renal/GU Renal disease     Musculoskeletal  (+) Arthritis ,   Abdominal   Peds  Hematology negative hematology ROS (+)   Anesthesia Other Findings   Reproductive/Obstetrics negative OB ROS                             Anesthesia Physical  Anesthesia Plan  ASA: II  Anesthesia Plan: Spinal   Post-op Pain Management:    Induction: Intravenous  Airway Management Planned:   Additional Equipment:   Intra-op Plan:   Post-operative Plan:   Informed Consent: I have reviewed the patients History and Physical, chart, labs and discussed the procedure including the risks, benefits and alternatives for the proposed anesthesia with the patient or authorized representative who has indicated his/her understanding and acceptance.   Dental advisory given  Plan Discussed with: CRNA  Anesthesia Plan Comments:         Anesthesia Quick Evaluation

## 2015-05-18 NOTE — Anesthesia Postprocedure Evaluation (Signed)
Anesthesia Post Note  Patient: Deanna [name]  Procedure(s) Performed: Procedure(s) (LRB): TOTAL KNEE ARTHROPLASTY; RIGHT KNEE (Right)  Patient location during evaluation: PACU Anesthesia Type: Spinal and MAC Level of consciousness: awake and alert Pain management: pain level controlled Vital Signs Assessment: post-procedure vital signs reviewed and stable Respiratory status: spontaneous breathing and respiratory function stable Cardiovascular status: blood pressure returned to baseline and stable Postop Assessment: spinal receding Anesthetic complications: no    Last Vitals:  Filed Vitals:   05/18/15 1300 05/18/15 1324  BP: 167/72   Pulse: 65 60  Temp:    Resp: 10 12    Last Pain:  Filed Vitals:   05/18/15 1326  PainSc: [place]

## 2015-05-19 ENCOUNTER — Encounter (HOSPITAL_COMMUNITY): Payer: Self-pay | Admitting: Physician Assistant

## 2015-05-19 DIAGNOSIS — N183 Chronic kidney disease, stage 3 unspecified: Secondary | ICD-10-CM

## 2015-05-19 HISTORY — DX: Chronic kidney disease, stage 3 unspecified: N18.30

## 2015-05-19 LAB — BASIC METABOLIC PANEL
Anion gap: 10 (ref 5–15)
BUN: 15 mg/dL (ref 6–20)
CHLORIDE: 104 mmol/L (ref 101–111)
CO2: 24 mmol/L (ref 22–32)
CREATININE: 1.48 mg/dL — AB (ref 0.44–1.00)
Calcium: 8.6 mg/dL — ABNORMAL LOW (ref 8.9–10.3)
GFR calc Af Amer: 42 mL/min — ABNORMAL LOW (ref >60)
GFR calc non Af Amer: 36 mL/min — ABNORMAL LOW (ref >60)
Glucose, Bld: 152 mg/dL — ABNORMAL HIGH (ref 65–99)
Potassium: 4.3 mmol/L (ref 3.5–5.1)
Sodium: 138 mmol/L (ref 135–145)

## 2015-05-19 LAB — CBC
HEMATOCRIT: 33 % — AB (ref 36.0–46.0)
HEMOGLOBIN: 11.3 g/dL — AB (ref 12.0–15.0)
MCH: 31.5 pg (ref 26.0–34.0)
MCHC: 34.2 g/dL (ref 30.0–36.0)
MCV: 91.9 fL (ref 78.0–100.0)
Platelets: 327 10*3/uL (ref 150–400)
RBC: 3.59 MIL/uL — ABNORMAL LOW (ref 3.87–5.11)
RDW: 13.3 % (ref 11.5–15.5)
WBC: 19.2 10*3/uL — ABNORMAL HIGH (ref 4.0–10.5)

## 2015-05-19 MED ORDER — BENAZEPRIL HCL 20 MG PO TABS
20.0000 mg | ORAL_TABLET | Freq: Every day | ORAL | Status: DC
  Administered 2015-05-21: 20 mg via ORAL
  Filled 2015-05-19: qty 1

## 2015-05-19 MED ORDER — AMLODIPINE BESYLATE 5 MG PO TABS
5.0000 mg | ORAL_TABLET | Freq: Every day | ORAL | Status: DC
  Administered 2015-05-21: 5 mg via ORAL
  Filled 2015-05-19: qty 1

## 2015-05-19 MED ORDER — BENAZEPRIL HCL 20 MG PO TABS: 20.0000 mg | ORAL_TABLET | Freq: Every day | ORAL | Status: DC

## 2015-05-19 MED ORDER — AMLODIPINE BESYLATE 5 MG PO TABS: 5.0000 mg | ORAL_TABLET | Freq: Every day | ORAL | Status: DC

## 2015-05-19 MED ORDER — PANTOPRAZOLE SODIUM 40 MG PO TBEC
40.0000 mg | DELAYED_RELEASE_TABLET | Freq: Two times a day (BID) | ORAL | Status: DC
  Administered 2015-05-19 – 2015-05-21 (×5): 40 mg via ORAL
  Filled 2015-05-19 (×5): qty 1

## 2015-05-19 NOTE — Care Management Note (Signed)
Case Management Note  Patient Details  Name: Deanna [name] MRN: [id number] Date of Birth: 1951/01/06  Subjective/Objective:          S/p right total knee arthroplasty          Action/Plan: Set up with [facility] for HHPT by MD office. Spoke with patient, she chose [facility]. Contacted Katie at [facility] and set up [place]. Contacted Khaylee Mcevoy with [facility] and cancelled service. Spoke with Ruby Cola from [place], they will be delivering CPM to patient's home, patient has rolling walker at home, does not want a 3N1. Patient stated that her husband will be able to assist her after discharge.    Expected Discharge Date:                  Expected Discharge Plan:  [place]  In-House Referral:  NA  Discharge planning Services  CM Consult  Post Acute Care Choice:  Durable Medical Equipment, Home Health Choice offered to:  Patient  DME Arranged:  CPM DME Agency:  [employer]  HH Arranged:  PT [place] Agency:  [facility]  Status of Service:  Completed, signed off  Medicare Important Message Given:    Date Medicare IM Given:    Medicare IM give by:    Date Additional Medicare IM Given:    Additional Medicare Important Message give by:     If discussed at [employer] of Stay Meetings, dates discussed:    Additional Comments:  Nila Nephew, RN 05/19/2015, 11:29 AM

## 2015-05-19 NOTE — Progress Notes (Signed)
Physical Therapy Treatment Patient Details Name: Deanna [name] MRN: [id number] DOB: 11/10/1950 Today's Date: 05/19/2015    History of Present Illness pt admitted for R TKA. PMHx: GERD, HTN    PT Comments    Pt remains very pleasant, mobilizing well and good ROM. Pt in bone foam on arrival and encouraged continued use as well as CPM after lunch. Pt educate for HEP, stair and increased gait. Will continue to follow.   Follow Up Recommendations  Home health PT     Equipment Recommendations       Recommendations for Other Services       Precautions / Restrictions Precautions Precautions: Knee Restrictions Weight Bearing Restrictions: Yes RLE Weight Bearing: Weight bearing as tolerated    Mobility  Bed Mobility Overal bed mobility: Modified Independent    General bed mobility comments: no rail and HOB flat  Transfers Overall transfer level: Needs assistance   Transfers: Sit to/from Stand Sit to Stand: Supervision         General transfer comment: cues for hand placement  Ambulation/Gait Ambulation/Gait assistance: Supervision Ambulation Distance (Feet): 500 Feet Assistive device: Rolling walker (2 wheeled) Gait Pattern/deviations: Step-through pattern;Decreased stride length   Gait velocity interpretation: Below normal speed for age/gender General Gait Details: cues for position in RW. Pt with step through pattern and good heel strike   Stairs Stairs: Yes Stairs assistance: Supervision Stair Management: Step to pattern;One rail Right;Sideways Number of Stairs: 3 General stair comments: cues for sequence with pt able to return demonstrate  Wheelchair Mobility    Modified Rankin (Stroke Patients Only)       Balance                                    Cognition Arousal/Alertness: Awake/alert Behavior During Therapy: WFL for tasks assessed/performed Overall Cognitive Status: Within Functional Limits for tasks assessed                       Exercises Total Joint Exercises Heel Slides: Right;15 reps;Supine;AAROM Hip ABduction/ADduction: AROM;Right;15 reps;Supine Straight Leg Raises: AROM;Right;15 reps;Supine Long Arc Quad: AROM;Seated;Right;15 reps Goniometric ROM: 2-80    General Comments        Pertinent Vitals/Pain Pain Assessment: 0-10 Pain Score: 3  Pain Location: right knee Pain Descriptors / Indicators: Aching Pain Intervention(s): Limited activity within patient's tolerance;Monitored during session;Premedicated before session;Repositioned;Ice applied    Home Living Family/patient expects to be discharged to:: Private residence Living Arrangements: Spouse/significant other Available Help at Discharge: Family;Available 24 hours/day Type of Home: House Home Access: Stairs to enter Entrance Stairs-Rails: Right Home Layout: One level Home Equipment: Walker - 2 wheels;Adaptive equipment      Prior Function        PT Goals (current goals can now be found in the care plan section) Progress towards PT goals: Progressing toward goals    Frequency       PT Plan Current plan remains appropriate    Co-evaluation             End of Session   Activity Tolerance: Patient tolerated treatment well Patient left: in chair;with call bell/phone within reach;with family/visitor present     Time: [id number] PT Time Calculation (min) (ACUTE ONLY): 28 min  Charges:  $Gait Training: 8-22 mins $Therapeutic Exercise: 8-22 mins  G CodesMelford Aase 2015-05-29, 12:47 PM Elwyn Reach, [place]

## 2015-05-19 NOTE — Progress Notes (Signed)
Utilization review completed.  

## 2015-05-19 NOTE — Evaluation (Addendum)
Occupational Therapy Evaluation Patient Details Name: Deanna [name] MRN: [id number] DOB: 08-Aug-1950 Today's Date: 05/19/2015    History of Present Illness pt admitted for R TKA. PMHx: GERD, HTN, obesity.   Clinical Impression   Pt admitted with above. Pt independent with ADLs, PTA. Feel pt will benefit from acute OT to increase independence prior to d/c. Education provided in session and pt verbalized understanding. OT signing off.    Follow Up Recommendations  No OT follow up    Equipment Recommendations  None recommended by OT    Recommendations for Other Services       Precautions / Restrictions Precautions Precautions: Knee Restrictions Weight Bearing Restrictions: Yes RLE Weight Bearing: Weight bearing as tolerated      Mobility Bed Mobility Overal bed mobility: Needs Assistance Bed Mobility: Supine to Sit;Sit to Supine     Supine to sit: Supervision Sit to supine: Supervision   General bed mobility comments: with use of rail and HOB 10 degrees  Transfers Overall transfer level: Needs assistance   Transfers: Sit to/from Stand Sit to Stand: Supervision (and setup for RW)         General transfer comment: cues for hand placement    Balance    Min guard without use of RW when ambulating in room.                                        ADL Overall ADL's : Needs assistance/impaired Eating/Feeding: Independent (sitting; stood and took meds with supervision given)                 Upper Body Dressing Details (indicate cue type and reason): pt donned gown on back-OT assisted, but feel pt could manage shirt sitting with setup assist and no IV Lower Body Dressing: Sit to/from stand;Supervision/safety;Set up   Toilet Transfer: Supervision/safety;Set up;Ambulation;RW;Regular Toilet;Grab bars       Tub/ Shower Transfer: Min guard;Ambulation;Walk-in shower   Functional mobility during ADLs: Rolling walker ( Min guard without RW;  setup and supervision with RW) General ADL Comments: Educated on safety such as safe footwear, use of bag on walker, rugs/items on floor, and recommended someone be with her for shower transfer and bathing. Educated on shower transfer technique and tub transfer technique. Educated on LB dressing technique and explained benefit of reaching down to Rt foot for LB dressing as it allows knee to bend. Educated on how to don footsie roll and purpose and how to don SCDs. Talked about reacher and DME.     Vision     Perception     Praxis      Pertinent Vitals/Pain Pain Assessment: 0-10 Pain Score:  (3.5) Pain Location: right knee Pain Intervention(s): Monitored during session;Ice applied     Hand Dominance     Extremity/Trunk Assessment Upper Extremity Assessment Upper Extremity Assessment: Overall WFL for tasks assessed   Lower Extremity Assessment Lower Extremity Assessment: Defer to PT evaluation RLE Deficits / Details: decreased strength and ROM as expected post op   Cervical / Trunk Assessment Cervical / Trunk Assessment: Normal   Communication Communication Communication: No difficulties   Cognition Arousal/Alertness: Awake/alert Behavior During Therapy: WFL for tasks assessed/performed Overall Cognitive Status: Within Functional Limits for tasks assessed                     General Comments  Exercises       Shoulder Instructions      Home Living Family/patient expects to be discharged to:: Private residence Living Arrangements: Spouse/significant other Available Help at Discharge: Family;Available 24 hours/day Type of Home: House Home Access: Stairs to enter [employer] of Steps: 3 Entrance Stairs-Rails: Right Home Layout: One level     Bathroom Shower/Tub: Walk-in shower;Curtain   Bathroom Toilet: Standard (sink close)     Home Equipment: Environmental consultant - 2 wheels;Adaptive equipment Adaptive Equipment: Reacher        Prior  Functioning/Environment Level of Independence: Needs assistance  Gait / Transfers Assistance Needed: assist with steps (sounds like Min guard assist)          OT Diagnosis: Acute pain   OT Problem List:     OT Treatment/Interventions:      OT Goals(Current goals can be found in the care plan section) Go home  OT Frequency:     Barriers to D/C:            Co-evaluation              End of Session Equipment Utilized During Treatment: Gait belt;Rolling walker CPM Right Knee CPM Right Knee: Off Additional Comments:  (placed in bone foam)  Activity Tolerance: Patient tolerated treatment well Patient left: in bed;with call bell/phone within reach;with family/visitor present   Time: [id number] OT Time Calculation (min): 36 min Charges:  OT General Charges $OT Visit: 1 Procedure OT Evaluation $OT Eval Low Complexity: 1 Procedure OT Treatments $Self Care/Home Management : 8-22 mins G-CodesBenito Mccreedy OTR/L [id number] 05/19/2015, 11:03 AM

## 2015-05-19 NOTE — Progress Notes (Signed)
Subjective: 1 Day Post-Op Procedure(s) (LRB): TOTAL KNEE ARTHROPLASTY; RIGHT KNEE (Right) Patient reports pain as 3 on 0-10 scale.    Objective: Vital signs in last 24 hours: Temp:  [97.8 F (36.6 C)-98.6 F (37 C)] 98.6 F (37 C) (01/17 0505) Pulse Rate:  [54-82] 74 (01/17 0505) Resp:  [8-21] 18 (01/17 0505) BP: (125-194)/(44-149) 125/53 mmHg (01/17 0505) SpO2:  [96 %-100 %] 96 % (01/17 0505)  Intake/Output from previous day: 01/16 0701 - 01/17 0700 In: 3018.3 [P.O.:240; I.V.:2678.3; IV Piggyback:100] Out: [id number] [Urine:1575] Intake/Output this shift:     Recent Labs  05/18/15 1856 05/19/15 0629  HGB 12.8 11.3*    Recent Labs  05/18/15 1856 05/19/15 0629  WBC 19.4* 19.2*  RBC 4.06 3.59*  HCT 37.5 33.0*  PLT 330 327    Recent Labs  05/18/15 1856 05/19/15 0629  NA  --  138  K  --  4.3  CL  --  104  CO2  --  24  BUN  --  15  CREATININE 1.42* 1.48*  GLUCOSE  --  152*  CALCIUM  --  8.6*   No results for input(s): LABPT, INR in the last 72 hours.  ABD soft Neurovascular intact Sensation intact distally Intact pulses distally Dorsiflexion/Plantar flexion intact Incision: dressing C/D/I  Assessment/Plan: 1 Day Post-Op Procedure(s) (LRB): TOTAL KNEE ARTHROPLASTY; RIGHT KNEE (Right)  Principal Problem:   Primary localized osteoarthritis of right knee Active Problems:   HTN (hypertension)   Obesity   GERD (gastroesophageal reflux disease)   Hx of pancreatitis   DJD (degenerative joint disease) of knee   Kidney disease, chronic, stage III (GFR 30-59 ml/min)  Advance diet Up with therapy Plan for discharge tomorrow Discharge home with home health  Deanna [name] 05/19/2015, 8:30 AM

## 2015-05-19 NOTE — Evaluation (Signed)
Physical Therapy Evaluation Patient Details Name: Deanna [name] MRN: [id number] DOB: 1950/08/30 Today's Date: 05/19/2015   History of Present Illness  pt admitted for R TKA. PMHx: GERD, HTN  Clinical Impression  Pt very pleasant, moving well, able to perform HEP. Pt with decreased ROM, strength and activity tolerance who will benefit from acute therapy to maximize mobility, function, gait and independence prior to D/C. Pt educated for CPM use, plan, progression and bone foam.     Follow Up Recommendations Home health PT    Equipment Recommendations  3in1 (PT)    Recommendations for Other Services       Precautions / Restrictions Precautions Precautions: Knee Restrictions RLE Weight Bearing: Weight bearing as tolerated      Mobility  Bed Mobility Overal bed mobility: Modified Independent             General bed mobility comments: with use of rail and HOB 10 degrees  Transfers Overall transfer level: Needs assistance   Transfers: Sit to/from Stand Sit to Stand: Supervision         General transfer comment: cues for hand placement, safety and sequence x 2 trials  Ambulation/Gait Ambulation/Gait assistance: Supervision Ambulation Distance (Feet): 250 Feet Assistive device: Rolling walker (2 wheeled) Gait Pattern/deviations: Step-through pattern;Decreased stride length   Gait velocity interpretation: Below normal speed for age/gender General Gait Details: cues for posture and position in RW. Pt with step through pattern and good heel strike  Stairs            Wheelchair Mobility    Modified Rankin (Stroke Patients Only)       Balance                                             Pertinent Vitals/Pain Pain Assessment: 0-10 Pain Score: 3  Pain Location: right knee Pain Descriptors / Indicators: Aching Pain Intervention(s): Limited activity within patient's tolerance;Monitored during session;Premedicated before  session;Repositioned;Ice applied    Home Living Family/patient expects to be discharged to:: Private residence Living Arrangements: Spouse/significant other Available Help at Discharge: Family;Available 24 hours/day Type of Home: House Home Access: Stairs to enter Entrance Stairs-Rails: Right Entrance Stairs-Number of Steps: 3 Home Layout: One level Home Equipment: Walker - 2 wheels      Prior Function Level of Independence: Independent               Hand Dominance        Extremity/Trunk Assessment   Upper Extremity Assessment: Overall WFL for tasks assessed           Lower Extremity Assessment: RLE deficits/detail RLE Deficits / Details: decreased strength and ROM as expected post op    Cervical / Trunk Assessment: Normal  Communication   Communication: No difficulties  Cognition Arousal/Alertness: Awake/alert Behavior During Therapy: WFL for tasks assessed/performed Overall Cognitive Status: Within Functional Limits for tasks assessed                      General Comments      Exercises Total Joint Exercises Heel Slides: Right;15 reps;Supine;AAROM Hip ABduction/ADduction: AROM;Right;15 reps;Supine Straight Leg Raises: AROM;Right;15 reps;Supine      Assessment/Plan    PT Assessment Patient needs continued PT services  PT Diagnosis Difficulty walking;Acute pain   PT Problem List Decreased strength;Decreased range of motion;Decreased activity tolerance;Decreased balance;Decreased mobility;Pain;Decreased knowledge of use of DME  PT Treatment Interventions DME instruction;Gait training;Stair training;Functional mobility training;Therapeutic activities;Therapeutic exercise;Patient/family education   PT Goals (Current goals can be found in the Care Plan section) Acute Rehab PT Goals Patient Stated Goal: return to walking without pain PT Goal Formulation: With patient Time For Goal Achievement: 05/26/15 Potential to Achieve Goals: Good     Frequency 7X/week   Barriers to discharge        Co-evaluation               End of Session   Activity Tolerance: Patient tolerated treatment well Patient left: in chair;with call bell/phone within reach (bone foam) Nurse Communication: Mobility status         Time: [id number] PT Time Calculation (min) (ACUTE ONLY): 29 min   Charges:   PT Evaluation $PT Eval Moderate Complexity: 1 Procedure PT Treatments $Gait Training: 8-22 mins   PT G CodesMelford Aase 05/19/2015, 8:42 AM Elwyn Reach, [place]

## 2015-05-20 ENCOUNTER — Inpatient Hospital Stay (HOSPITAL_COMMUNITY): Payer: BC Managed Care – PPO

## 2015-05-20 LAB — BASIC METABOLIC PANEL
Anion gap: 11 (ref 5–15)
BUN: 19 mg/dL (ref 6–20)
CO2: 25 mmol/L (ref 22–32)
Calcium: 8.8 mg/dL — ABNORMAL LOW (ref 8.9–10.3)
Chloride: 104 mmol/L (ref 101–111)
Creatinine, Ser: 1.32 mg/dL — ABNORMAL HIGH (ref 0.44–1.00)
GFR calc Af Amer: 48 mL/min — ABNORMAL LOW (ref >60)
GFR, EST NON AFRICAN AMERICAN: 42 mL/min — AB (ref >60)
GLUCOSE: 135 mg/dL — AB (ref 65–99)
POTASSIUM: 4.2 mmol/L (ref 3.5–5.1)
Sodium: 140 mmol/L (ref 135–145)

## 2015-05-20 LAB — CBC
HEMATOCRIT: 32.8 % — AB (ref 36.0–46.0)
HEMOGLOBIN: 10.9 g/dL — AB (ref 12.0–15.0)
MCH: 31.1 pg (ref 26.0–34.0)
MCHC: 33.2 g/dL (ref 30.0–36.0)
MCV: 93.4 fL (ref 78.0–100.0)
Platelets: 340 10*3/uL (ref 150–400)
RBC: 3.51 MIL/uL — ABNORMAL LOW (ref 3.87–5.11)
RDW: 13.7 % (ref 11.5–15.5)
WBC: 29.6 10*3/uL — AB (ref 4.0–10.5)

## 2015-05-20 MED ORDER — DEXTROSE 5 % IV SOLN
2.0000 g | INTRAVENOUS | Status: DC
  Administered 2015-05-20 – 2015-05-21 (×2): 2 g via INTRAVENOUS
  Filled 2015-05-20 (×3): qty 2

## 2015-05-20 NOTE — Progress Notes (Signed)
Physical Therapy Treatment Patient Details Name: Deanna [name] MRN: [id number] DOB: 08/26/1950 Today's Date: 05/20/2015    History of Present Illness pt admitted for R TKA. PMHx: GERD, HTN    PT Comments    Patient continues to do well with PT with improved mobility and ROM this session. Gait training with SPC. Patient needs to practice stairs with [facility] next session.     Follow Up Recommendations  Home health PT     Equipment Recommendations  3in1 (PT)    Recommendations for Other Services       Precautions / Restrictions Precautions Precautions: Knee Restrictions Weight Bearing Restrictions: Yes RLE Weight Bearing: Weight bearing as tolerated    Mobility  Bed Mobility               General bed mobility comments: OOB up in chair upon arrival  Transfers Overall transfer level: Needs assistance   Transfers: Sit to/from Stand Sit to Stand: Supervision         General transfer comment: carry over of hand placement and technique; supervision for safety  Ambulation/Gait Ambulation/Gait assistance: Supervision Ambulation Distance (Feet): 300 Feet (100 with RW; 200 with SPC) Assistive device: Rolling walker (2 wheeled);Straight cane Gait Pattern/deviations: Step-through pattern   Gait velocity interpretation: Below normal speed for age/gender General Gait Details: educated on 3 point and 2 point gait pattern with SPC; min guard at times for safety but no unsteadiness/LOB noted   Stairs            Wheelchair Mobility    Modified Rankin (Stroke Patients Only)       Balance                                    Cognition Arousal/Alertness: Awake/alert Behavior During Therapy: WFL for tasks assessed/performed Overall Cognitive Status: Within Functional Limits for tasks assessed                      Exercises Total Joint Exercises Quad Sets: AROM;Right;10 reps;Seated Heel Slides: AROM;Right;10 reps;Seated Long Arc Quad:  AROM;10 reps;Right;Seated Goniometric ROM: 0-90    General Comments        Pertinent Vitals/Pain Pain Assessment: 0-10 Pain Score: 3  (6 with knee flexion) Pain Location: R knee Pain Descriptors / Indicators: Sore Pain Intervention(s): Limited activity within patient's tolerance;Monitored during session;Repositioned;Patient requesting pain meds-RN notified    Home Living                      Prior Function            PT Goals (current goals can now be found in the care plan section) Acute Rehab PT Goals Patient Stated Goal: go home Progress towards PT goals: Progressing toward goals    Frequency  7X/week    PT Plan Current plan remains appropriate    Co-evaluation             End of Session Equipment Utilized During Treatment: Gait belt Activity Tolerance: Patient tolerated treatment well Patient left: in chair;with call bell/phone within reach;with family/visitor present     Time: [id number] PT Time Calculation (min) (ACUTE ONLY): 16 min  Charges:  $Gait Training: 8-22 mins                    G Codes:      Salina April, PTA Pager: [phone]   05/20/2015, 10:32 AM

## 2015-05-20 NOTE — Progress Notes (Signed)
Physical Therapy Treatment Patient Details Name: Deanna [name] MRN: [id number] DOB: 11-Jan-1951 Today's Date: 05/20/2015    History of Present Illness pt admitted for R TKA. PMHx: GERD, HTN    PT Comments    Patient is making good progress with PT.  From a mobility standpoint anticipate patient will be ready for DC home when medically ready.     Follow Up Recommendations  Home health PT     Equipment Recommendations  3in1 (PT)    Recommendations for Other Services       Precautions / Restrictions Precautions Precautions: Knee Restrictions Weight Bearing Restrictions: Yes RLE Weight Bearing: Weight bearing as tolerated    Mobility  Bed Mobility Overal bed mobility: Modified Independent Bed Mobility: Sit to Supine       Sit to supine: Modified independent (Device/Increase time)   General bed mobility comments: walking in hallway with husband using RW; pt returned to bed and in CPM without need for assistance elevating R LE to place CPM machine  Transfers Overall transfer level: Modified independent Equipment used: Straight cane Transfers: Sit to/from Stand Sit to Stand: Modified independent (Device/Increase time)         General transfer comment: safe hand placement and technique  Ambulation/Gait Ambulation/Gait assistance: Supervision Ambulation Distance (Feet): 200 Feet Assistive device: Rolling walker (2 wheeled);Straight cane Gait Pattern/deviations: Step-through pattern   Gait velocity interpretation: Below normal speed for age/gender General Gait Details: carry over of gait pattern with use of SPC with not unsteadiness; min vc for correct sequencing   Stairs Stairs: Yes Stairs assistance: Min guard Stair Management: One rail Right;Forwards;With cane Number of Stairs: 10 General stair comments: vc and demonstation for technique; min guard for safety  Wheelchair Mobility    Modified Rankin (Stroke Patients Only)       Balance                                     Cognition Arousal/Alertness: Awake/alert Behavior During Therapy: WFL for tasks assessed/performed Overall Cognitive Status: Within Functional Limits for tasks assessed                      Exercises      General Comments        Pertinent Vitals/Pain Pain Assessment: No/denies pain    Home Living                      Prior Function            PT Goals (current goals can now be found in the care plan section) Acute Rehab PT Goals Patient Stated Goal: go home Progress towards PT goals: Progressing toward goals    Frequency  7X/week    PT Plan Current plan remains appropriate    Co-evaluation             End of Session Equipment Utilized During Treatment: Gait belt Activity Tolerance: Patient tolerated treatment well Patient left: with call bell/phone within reach;with family/visitor present;in bed;in CPM     Time: 1450-1511 PT Time Calculation (min) (ACUTE ONLY): 21 min  Charges:  $Gait Training: 8-22 mins                    G Codes:      Salina April, PTA Pager: [phone]   05/20/2015, 4:17 PM

## 2015-05-21 LAB — CBC
HEMATOCRIT: 29.6 % — AB (ref 36.0–46.0)
HEMOGLOBIN: 9.8 g/dL — AB (ref 12.0–15.0)
MCH: 30.9 pg (ref 26.0–34.0)
MCHC: 33.1 g/dL (ref 30.0–36.0)
MCV: 93.4 fL (ref 78.0–100.0)
Platelets: 289 10*3/uL (ref 150–400)
RBC: 3.17 MIL/uL — AB (ref 3.87–5.11)
RDW: 13.8 % (ref 11.5–15.5)
WBC: 20.3 10*3/uL — ABNORMAL HIGH (ref 4.0–10.5)

## 2015-05-21 LAB — BASIC METABOLIC PANEL
Anion gap: 9 (ref 5–15)
BUN: 24 mg/dL — AB (ref 6–20)
CHLORIDE: 105 mmol/L (ref 101–111)
CO2: 26 mmol/L (ref 22–32)
Calcium: 8.4 mg/dL — ABNORMAL LOW (ref 8.9–10.3)
Creatinine, Ser: 1.35 mg/dL — ABNORMAL HIGH (ref 0.44–1.00)
GFR calc Af Amer: 47 mL/min — ABNORMAL LOW (ref >60)
GFR calc non Af Amer: 41 mL/min — ABNORMAL LOW (ref >60)
GLUCOSE: 90 mg/dL (ref 65–99)
POTASSIUM: 3.9 mmol/L (ref 3.5–5.1)
Sodium: 140 mmol/L (ref 135–145)

## 2015-05-21 LAB — URINE CULTURE: Culture: NO GROWTH

## 2015-05-21 MED ORDER — POLYETHYLENE GLYCOL 3350 17 G PO PACK: PACK | ORAL | Status: DC

## 2015-05-21 MED ORDER — EPINEPHRINE HCL 0.1 MG/ML IJ SOSY: 0.3000 mg | PREFILLED_SYRINGE | Freq: Once | INTRAMUSCULAR | Status: DC

## 2015-05-21 MED ORDER — CEFUROXIME AXETIL 500 MG PO TABS: 500.0000 mg | ORAL_TABLET | Freq: Two times a day (BID) | ORAL | Status: DC

## 2015-05-21 MED ORDER — EPINEPHRINE HCL 0.1 MG/ML IJ SOSY: 0.3000 mg | PREFILLED_SYRINGE | Freq: Once | INTRAMUSCULAR | Status: AC

## 2015-05-21 MED ORDER — OXYCODONE HCL 5 MG PO TABS: ORAL_TABLET | ORAL | Status: DC

## 2015-05-21 MED ORDER — EPINEPHRINE HCL 1 MG/ML IJ SOLN
INTRAMUSCULAR | Status: AC
  Administered 2015-05-21: 0.3 mg
  Filled 2015-05-21: qty 1

## 2015-05-21 MED ORDER — ENOXAPARIN SODIUM 30 MG/0.3ML ~~LOC~~ SOLN: 30.0000 mg | SUBCUTANEOUS | Status: DC

## 2015-05-21 MED ORDER — DOCUSATE SODIUM 100 MG PO CAPS: ORAL_CAPSULE | ORAL | Status: DC

## 2015-05-21 NOTE — Progress Notes (Signed)
Pt discharged to home accompanied by husband. All discharge teachings done with pt and husband and both verbalize understanding and agree to comply. Pt given prescriptions. Pt has follow up appt with MD. Pt also was able to return demonstrate Lovenox administration after verbal instruction this AM. Pt given epinephrine 0.3 mg IM per PA order for questionable allergic reaction to antibiodic pt was receiving in hospital prior to her discharge. Pt upon discharge states that her "itching and throat clearing are much better" after epinephrine. Pt aware of all emergency measures to take regarding allergic reactions.

## 2015-05-21 NOTE — Progress Notes (Signed)
Patient needing CBC and diff on Friday and Monday, contacted Tiffany at [facility] and added [facility] for blood draws. Informed patient of HHRN coming to draw blood on Friday and Monday.

## 2015-05-21 NOTE — Progress Notes (Signed)
Physical Therapy Treatment Patient Details Name: Deanna [name] MRN: [id number] DOB: 1950-06-06 Today's Date: 06/14/2015    History of Present Illness pt admitted for R TKA. PMHx: GERD, HTN    PT Comments    Pt with excellent progress with gait able to ambulate unit with cane without difficulty other than slow gait. Pt with great ROM and educated for HEP, states she slept in bone foam. Encouraged CPM after breakfast. Safe for D/C. Will continue to follow.   Follow Up Recommendations  Home health PT     Equipment Recommendations       Recommendations for Other Services       Precautions / Restrictions Precautions Precautions: Knee Restrictions RLE Weight Bearing: Weight bearing as tolerated    Mobility  Bed Mobility               General bed mobility comments: in chair on arrival  Transfers Overall transfer level: Modified independent                  Ambulation/Gait Ambulation/Gait assistance: Supervision Ambulation Distance (Feet): 500 Feet Assistive device: Straight cane Gait Pattern/deviations: Step-through pattern;Decreased stride length   Gait velocity interpretation: Below normal speed for age/gender General Gait Details: cues for swing through as pt initiated with circumduction of RLE   Stairs            Wheelchair Mobility    Modified Rankin (Stroke Patients Only)       Balance                                    Cognition Arousal/Alertness: Awake/alert Behavior During Therapy: WFL for tasks assessed/performed Overall Cognitive Status: Within Functional Limits for tasks assessed                      Exercises Total Joint Exercises Hip ABduction/ADduction: AROM;Seated;Right;20 reps Straight Leg Raises: AROM;Seated;Right;15 reps Long Arc Quad: AROM;Seated;Right;20 reps Knee Flexion: AROM;Seated;Right;20 reps Goniometric ROM: 0-97    General Comments        Pertinent Vitals/Pain Pain Score: 4   Pain Location: right knee Pain Descriptors / Indicators: Aching Pain Intervention(s): Limited activity within patient's tolerance;Monitored during session;Premedicated before session;Repositioned    Home Living                      Prior Function            PT Goals (current goals can now be found in the care plan section) Progress towards PT goals: Progressing toward goals    Frequency       PT Plan Current plan remains appropriate    Co-evaluation             End of Session   Activity Tolerance: Patient tolerated treatment well Patient left: in chair;with call bell/phone within reach     Time: 0723-0745 PT Time Calculation (min) (ACUTE ONLY): 22 min  Charges:  $Gait Training: 8-22 mins                    G Codes:      Melford Aase 06/14/2015, 7:51 AM Elwyn Reach, [place]

## 2015-05-25 NOTE — Discharge Summary (Signed)
Patient ID: Deanna [name] MRN: [id number] DOB/AGE: 1950-07-02 65 y.o.  Admit date: 05/18/2015 Discharge date: 05/21/2015  Admission Diagnoses:  Principal Problem:   Primary localized osteoarthritis of right knee Active Problems:   HTN (hypertension)   Obesity   GERD (gastroesophageal reflux disease)   Hx of pancreatitis   DJD (degenerative joint disease) of knee   Kidney disease, chronic, stage III (GFR 30-59 ml/min)   Discharge Diagnoses:  Same  Past Medical History  Diagnosis Date  . HTN (hypertension)   . Reflux   . GERD (gastroesophageal reflux disease)   . Hx of pancreatitis 2015    attributes to Etodolac and Zpack  . Primary localized osteoarthritis of right knee   . History of hiatal hernia   . Chronic kidney disease     pt. reports that during episode of pancreatitis, she experienced decreased kidney function related to contrast dye   . Kidney disease, chronic, stage III (GFR 30-59 ml/min) 05/19/2015    Surgeries: Procedure(s): TOTAL KNEE ARTHROPLASTY; RIGHT KNEE on 05/18/2015   Consultants:    Discharged Condition: Improved  Hospital Course: TALLY LAMARQUE is an 65 y.o. female who was admitted 05/18/2015 for operative treatment ofPrimary localized osteoarthritis of right knee. Patient has severe unremitting pain that affects sleep, daily activities, and work/hobbies. After pre-op clearance the patient was taken to the operating room on 05/18/2015 and underwent  Procedure(s): TOTAL KNEE ARTHROPLASTY; RIGHT KNEE.    Patient was given perioperative antibiotics:  Anti-infectives    Start     Dose/Rate Route Frequency Ordered Stop   05/21/15 0000  cefUROXime (CEFTIN) 500 MG tablet  Status:  Discontinued     500 mg Oral 2 times daily with meals 05/21/15 1017 05/21/15    05/20/15 0900  cefTRIAXone (ROCEPHIN) 2 g in dextrose 5 % 50 mL IVPB  Status:  Discontinued     2 g 100 mL/hr over 30 Minutes Intravenous Every 24 hours 05/20/15 0827 05/21/15 1455   05/18/15 1830   ceFAZolin (ANCEF) IVPB 2 g/50 mL premix     2 g 100 mL/hr over 30 Minutes Intravenous Every 6 hours 05/18/15 1826 05/19/15 0112   05/18/15 0830  ceFAZolin (ANCEF) IVPB 2 g/50 mL premix     2 g 100 mL/hr over 30 Minutes Intravenous To [facility] 05/15/15 1105 05/18/15 0918       Patient was given sequential compression devices, early ambulation, and chemoprophylaxis to prevent DVT.  Patient was kept an extra night due to leukocytosis and started on Rocephin.  She began having itching and and a cough so she as given epinephrine and benedryl.  Home health nursing was ordered to monitor her CBC  Patient benefited maximally from hospital stay and there were no complications.    Recent vital signs: No data found.    Recent laboratory studies: No results for input(s): WBC, HGB, HCT, PLT, NA, K, CL, CO2, BUN, CREATININE, GLUCOSE, INR, CALCIUM in the last 72 hours.  Invalid input(s): PT, 2   Discharge Medications:     Medication List    STOP taking these medications        CO Q 10 PO     COSAMIN DS PO     diphenhydrAMINE 25 MG tablet  Commonly known as:  BENADRYL     ibuprofen 200 MG tablet  Commonly known as:  ADVIL,MOTRIN     Krill Oil 300 MG Caps     TURMERIC PO      TAKE these medications  acetaminophen 500 MG tablet  Commonly known as:  TYLENOL  Take 500 mg by mouth every 6 (six) hours as needed.     ACIDOPHILUS PO  Take 1 tablet by mouth daily.     amLODipine-benazepril 5-20 MG capsule  Commonly known as:  LOTREL  Take 1 capsule by mouth every morning.     atorvastatin 20 MG tablet  Commonly known as:  LIPITOR  Take 20 mg by mouth daily after breakfast.     docusate sodium 100 MG capsule  Commonly known as:  COLACE  1 tab 2 times a day while on narcotics.  STOOL SOFTENER     enoxaparin 30 MG/0.3ML injection  Commonly known as:  LOVENOX  Inject 0.3 mLs (30 mg total) into the skin daily.     magnesium oxide 400 MG tablet  Commonly known as:   MAG-OX  Take 400 mg by mouth daily. Pt. Takes Magnesium citrate 100mg . Oral pill - daily     multivitamin tablet  Take 1 tablet by mouth daily.     oxyCODONE 5 MG immediate release tablet  Commonly known as:  Oxy IR/ROXICODONE  1-2 tablets every 4-6 hrs as needed for pain     pantoprazole 40 MG tablet  Commonly known as:  PROTONIX  Take 40 mg by mouth 2 (two) times daily.     polyethylene glycol packet  Commonly known as:  MIRALAX / GLYCOLAX  17grams in 16 oz of water twice a day until bowel movement.  LAXITIVE.  Restart if two days since last bowel movement     vitamin B-12 1000 MCG tablet  Commonly known as:  CYANOCOBALAMIN  Take 1,000 mcg by mouth daily.        Diagnostic Studies: Dg Chest 2 View  05/20/2015  CLINICAL DATA:  Leukocytosis. EXAM: CHEST  2 VIEW COMPARISON:  November 11, 2011 FINDINGS: Lungs are clear. Heart size and pulmonary vascularity are normal. No adenopathy. No bone lesions. IMPRESSION: No edema or consolidation. Electronically Signed   By: Lowella Grip III M.D.   On: 05/20/2015 10:45   Dg Abd 1 View  05/20/2015  CLINICAL DATA:  Leukocytosis.  Postop right knee replacement. EXAM: ABDOMEN - 1 VIEW COMPARISON:  09/28/2014 FINDINGS: Nonobstructive bowel gas pattern. No free air, organomegaly or suspicious calcification. No acute bony abnormality. IMPRESSION: No acute findings. Electronically Signed   By: Rolm Baptise M.D.   On: 05/20/2015 10:44    Disposition: 01-Home or Self Care      Discharge Instructions    CPM    Complete by:  As directed   Continuous passive motion machine (CPM):      Use the CPM from 0 to 90 for 6 hours per day.       You may break it up into 2 or 3 sessions per day.      Use CPM for 2 weeks or until you are told to stop.     Call MD / Call 911    Complete by:  As directed   If you experience chest pain or shortness of breath, CALL 911 and be transported to the hospital emergency room.  If you develope a fever above 101 F, pus  (white drainage) or increased drainage or redness at the wound, or calf pain, call your surgeon's office.     Change dressing    Complete by:  As directed   Change the gauze dressing daily with sterile 4 x 4 inch gauze and apply TED hose.  DO NOT REMOVE BANDAGE OVER SURGICAL INCISION.  [place] WHOLE LEG INCLUDING OVER THE WATERPROOF BANDAGE WITH SOAP AND WATER EVERY DAY.     Constipation Prevention    Complete by:  As directed   Drink plenty of fluids.  Prune juice may be helpful.  You may use a stool softener, such as Colace (over the counter) 100 mg twice a day.  Use MiraLax (over the counter) for constipation as needed.     Diet - low sodium heart healthy    Complete by:  As directed      Discharge instructions    Complete by:  As directed   INSTRUCTIONS AFTER JOINT REPLACEMENT   Remove items at home which could result in a fall. This includes throw rugs or furniture in walking pathways ICE to the affected joint every three hours while awake for 30 minutes at a time, for at least the first 3-5 days, and then as needed for pain and swelling.  Continue to use ice for pain and swelling. You may notice swelling that will progress down to the foot and ankle.  This is normal after surgery.  Elevate your leg when you are not up walking on it.   Continue to use the breathing machine you got in the hospital (incentive spirometer) which will help keep your temperature down.  It is common for your temperature to cycle up and down following surgery, especially at night when you are not up moving around and exerting yourself.  The breathing machine keeps your lungs expanded and your temperature down.   DIET:  As you were doing prior to hospitalization, we recommend a well-balanced diet.  DRESSING / WOUND CARE / SHOWERING  Keep the surgical dressing until follow up.  The dressing is water proof, so you can shower without any extra covering.  IF THE DRESSING FALLS OFF or the wound gets wet inside, change the  dressing with sterile gauze.  Please use good hand washing techniques before changing the dressing.  Do not use any lotions or creams on the incision until instructed by your surgeon.    ACTIVITY  Increase activity slowly as tolerated, but follow the weight bearing instructions below.   No driving for 6 weeks or until further direction given by your physician.  You cannot drive while taking narcotics.  No lifting or carrying greater than 10 lbs. until further directed by your surgeon. Avoid periods of inactivity such as sitting longer than an hour when not asleep. This helps prevent blood clots.  You may return to work once you are authorized by your doctor.     WEIGHT BEARING   Weight bearing as tolerated with assist device (walker, cane, etc) as directed, use it as long as suggested by your surgeon or therapist, typically at least 1-2  weeks.   EXERCISES  Results after joint replacement surgery are often greatly improved when you follow the exercise, range of motion and muscle strengthening exercises prescribed by your doctor. Safety measures are also important to protect the joint from further injury. Any time any of these exercises cause you to have increased pain or swelling, decrease what you are doing until you are comfortable again and then slowly increase them. If you have problems or questions, call your caregiver or physical therapist for advice.   Rehabilitation is important following a joint replacement. After just a few days of immobilization, the muscles of the leg can become weakened and shrink (atrophy).  These exercises are designed to build  up the tone and strength of the thigh and leg muscles and to improve motion. Often times heat used for twenty to thirty minutes before working out will loosen up your tissues and help with improving the range of motion but do not use heat for the first two weeks following surgery (sometimes heat can increase post-operative swelling).    These exercises can be done on a training (exercise) mat, on the floor, on a table or on a bed. Use whatever works the best and is most comfortable for you.    Use music or television while you are exercising so that the exercises are a pleasant break in your day. This will make your life better with the exercises acting as a break in your routine that you can look forward to.   Perform all exercises about fifteen times, three times per day or as directed.  You should exercise both the operative leg and the other leg as well.   Exercises include:   Quad Sets - Tighten up the muscle on the front of the thigh (Quad) and hold for 5-10 seconds.   Straight Leg Raises - With your knee straight (if you were given a brace, keep it on), lift the leg to 60 degrees, hold for 3 seconds, and slowly lower the leg.  Perform this exercise against resistance later as your leg gets stronger.  Leg Slides: Lying on your back, slowly slide your foot toward your buttocks, bending your knee up off the floor (only go as far as is comfortable). Then slowly slide your foot back down until your leg is flat on the floor again.  Angel Wings: Lying on your back spread your legs to the side as far apart as you can without causing discomfort.  Hamstring Strength:  Lying on your back, push your heel against the floor with your leg straight by tightening up the muscles of your buttocks.  Repeat, but this time bend your knee to a comfortable angle, and push your heel against the floor.  You may put a pillow under the heel to make it more comfortable if necessary.   A rehabilitation program following joint replacement surgery can speed recovery and prevent re-injury in the future due to weakened muscles. Contact your doctor or a physical therapist for more information on knee rehabilitation.    CONSTIPATION  Constipation is defined medically as fewer than three stools per week and severe constipation as less than one stool per week.   Even if you have a regular bowel pattern at home, your normal regimen is likely to be disrupted due to multiple reasons following surgery.  Combination of anesthesia, postoperative narcotics, change in appetite and fluid intake all can affect your bowels.   YOU MUST use at least one of the following options; they are listed in order of increasing strength to get the job done.  They are all available over the counter, and you may need to use some, POSSIBLY even all of these options:    Drink plenty of fluids (prune juice may be helpful) and high fiber foods Colace 100 mg by mouth twice a day  Senokot for constipation as directed and as needed Dulcolax (bisacodyl), take with full glass of water  Miralax (polyethylene glycol) once or twice a day as needed.  If you have tried all these things and are unable to have a bowel movement in the first 3-4 days after surgery call either your surgeon or your primary doctor.    If  you experience loose stools or diarrhea, hold the medications until you stool forms back up.  If your symptoms do not get better within 1 week or if they get worse, check with your doctor.  If you experience "the worst abdominal pain ever" or develop nausea or vomiting, please contact the office immediately for further recommendations for treatment.   ITCHING:  If you experience itching with your medications, try taking only a single pain pill, or even half a pain pill at a time.  You can also use Benadryl over the counter for itching or also to help with sleep.   TED HOSE STOCKINGS:  Use stockings on both legs until for at least 2 weeks or as directed by physician office. They may be removed at night for sleeping.  MEDICATIONS:  See your medication summary on the "After Visit Summary" that nursing will review with you.  You may have some home medications which will be placed on hold until you complete the course of blood thinner medication.  It is important for you to complete the  blood thinner medication as prescribed.  PRECAUTIONS:  If you experience chest pain or shortness of breath - call 911 immediately for transfer to the hospital emergency department.   If you develop a fever greater that 101 F, purulent drainage from wound, increased redness or drainage from wound, foul odor from the wound/dressing, or calf pain - CONTACT YOUR SURGEON.                                                   FOLLOW-UP APPOINTMENTS:  If you do not already have a post-op appointment, please call the office for an appointment to be seen by your surgeon.  Guidelines for how soon to be seen are listed in your "After Visit Summary", but are typically between 1-4 weeks after surgery.  OTHER INSTRUCTIONS:   Knee Replacement:  Do not place pillow under knee, focus on keeping the knee straight while resting. CPM instructions: 0-90 degrees, 2 hours in the morning, 2 hours in the afternoon, and 2 hours in the evening. Place foam block, curve side up under heel at all times except when in CPM or when walking.  DO NOT modify, tear, cut, or change the foam block in any way.  MAKE SURE YOU:  Understand these instructions.  Get help right away if you are not doing well or get worse.    Thank you for letting us be a part of your medical care team.  It is a privilege we respect greatly.  We hope these instructions will help you stay on track for a fast and full recovery!     Do not put a pillow under the knee. Place it under the heel.    Complete by:  As directed   Place gray foam block, curve side up under heel at all times except when in CPM or when walking.  DO NOT modify, tear, cut, or change in any way the gray foam block.     Increase activity slowly as tolerated    Complete by:  As directed      TED hose    Complete by:  As directed   Use stockings (TED hose) for 2 weeks on both leg(s).  You may remove them at night for sleeping.  Follow-up Information    Follow up with [facility].   Why:  They will contact you to schedule home therapy visits.   Contact information:   [address] [place] [address] [phone]        Signed: Linda Hedges 05/25/2015, 3:51 PM

## 2015-05-26 ENCOUNTER — Other Ambulatory Visit (HOSPITAL_COMMUNITY)
Admission: RE | Admit: 2015-05-26 | Discharge: 2015-05-26 | Disposition: A | Discharge status: Home / Self Care | Payer: BC Managed Care – PPO | Source: Other Acute Inpatient Hospital | Attending: Orthopedic Surgery | Admitting: Orthopedic Surgery

## 2015-05-26 DIAGNOSIS — Z5181 Encounter for therapeutic drug level monitoring: Secondary | ICD-10-CM | POA: Insufficient documentation

## 2015-05-26 LAB — CBC WITH DIFFERENTIAL/PLATELET
Basophils Absolute: 0 10*3/uL (ref 0.0–0.1)
Basophils Relative: 0 %
EOS PCT: 5 %
Eosinophils Absolute: 0.8 10*3/uL — ABNORMAL HIGH (ref 0.0–0.7)
HEMATOCRIT: 31.6 % — AB (ref 36.0–46.0)
Hemoglobin: 10.3 g/dL — ABNORMAL LOW (ref 12.0–15.0)
LYMPHS ABS: 3.8 10*3/uL (ref 0.7–4.0)
LYMPHS PCT: 24 %
MCH: 30.7 pg (ref 26.0–34.0)
MCHC: 32.6 g/dL (ref 30.0–36.0)
MCV: 94 fL (ref 78.0–100.0)
MONO ABS: 1.5 10*3/uL — AB (ref 0.1–1.0)
MONOS PCT: 9 %
Neutro Abs: 10.1 10*3/uL — ABNORMAL HIGH (ref 1.7–7.7)
Neutrophils Relative %: 62 %
Platelets: 416 10*3/uL — ABNORMAL HIGH (ref 150–400)
RBC: 3.36 MIL/uL — AB (ref 3.87–5.11)
RDW: 14 % (ref 11.5–15.5)
WBC: 16.2 10*3/uL — AB (ref 4.0–10.5)

## 2015-10-21 ENCOUNTER — Other Ambulatory Visit (HOSPITAL_COMMUNITY): Payer: Self-pay | Admitting: Pulmonary Disease

## 2015-10-21 DIAGNOSIS — Z1231 Encounter for screening mammogram for malignant neoplasm of breast: Secondary | ICD-10-CM

## 2015-10-26 ENCOUNTER — Ambulatory Visit (HOSPITAL_COMMUNITY)
Admission: RE | Admit: 2015-10-26 | Discharge: 2015-10-26 | Disposition: A | Discharge status: Home / Self Care | Payer: BC Managed Care – PPO | Source: Ambulatory Visit | Attending: Pulmonary Disease | Admitting: Pulmonary Disease

## 2015-10-26 DIAGNOSIS — Z1231 Encounter for screening mammogram for malignant neoplasm of breast: Secondary | ICD-10-CM | POA: Diagnosis present

## 2015-11-04 ENCOUNTER — Encounter (HOSPITAL_COMMUNITY): Payer: Self-pay | Admitting: Physician Assistant

## 2015-11-04 DIAGNOSIS — M1712 Unilateral primary osteoarthritis, left knee: Secondary | ICD-10-CM | POA: Diagnosis present

## 2015-11-04 NOTE — H&P (Signed)
TOTAL KNEE ADMISSION H&P  Patient is being admitted for left total knee arthroplasty.  Subjective:  Chief Complaint:left knee pain.  HPI: Deanna [name], 65 y.o. female, has a history of pain and functional disability in the left knee due to arthritis and has failed non-surgical conservative treatments for greater than 12 weeks to includeNSAID's and/or analgesics, corticosteriod injections, viscosupplementation injections, flexibility and strengthening excercises, supervised PT with diminished ADL's post treatment, use of assistive devices, weight reduction as appropriate and activity modification.  Onset of symptoms was gradual, starting 10 years ago with gradually worsening course since that time. The patient noted prior procedures on the knee to include  arthroscopy and menisectomy on the left knee(s).  Patient currently rates pain in the left knee(s) at 10 out of 10 with activity. Patient has night pain, worsening of pain with activity and weight bearing, pain that interferes with activities of daily living, pain with passive range of motion, crepitus and joint swelling.  Patient has evidence of subchondral sclerosis, periarticular osteophytes and joint space narrowing by imaging studies. There is no active infection.  Patient Active Problem List   Diagnosis Date Noted  . Primary localized osteoarthritis of left knee 11/04/2015  . Kidney disease, chronic, stage III (GFR 30-59 ml/min) 05/19/2015  . DJD (degenerative joint disease) of knee 05/18/2015  . Primary localized osteoarthritis of right knee   . GERD (gastroesophageal reflux disease)   . Hx of pancreatitis   . Pancreatic lesion 09/13/2013  . Nonspecific (abnormal) findings on radiological and other examination of gastrointestinal tract 08/15/2013  . Acute kidney injury ([place]) 06/28/2013  . Pancreatitis, acute 06/25/2013  . HTN (hypertension) 06/25/2013  . Obesity 06/25/2013  . Acquired trigger finger 02/01/2012   Past Medical  History  Diagnosis Date  . HTN (hypertension)   . Reflux   . GERD (gastroesophageal reflux disease)   . Hx of pancreatitis 2015    attributes to Etodolac and Zpack  . Primary localized osteoarthritis of right knee   . History of hiatal hernia   . Chronic kidney disease     pt. reports that during episode of pancreatitis, she experienced decreased kidney function related to contrast dye   . Kidney disease, chronic, stage III (GFR 30-59 ml/min) 05/19/2015  . Primary localized osteoarthritis of left knee     Past Surgical History  Procedure Laterality Date  . Foot surgery    . Laparoscopic endometriosis fulguration    . Trigger thumb      rt thumb last year  . Foot surgery Bilateral     bunions  . Knee arthroscopy w/ meniscal repair Left   . Eus N/A 08/15/2013    Procedure: UPPER ENDOSCOPIC ULTRASOUND (EUS) LINEAR;  Surgeon: Milus Banister, MD;  Location: [facility];  Service: Endoscopy;  Laterality: N/A;  . Diagnostic laparoscopy    . Total knee arthroplasty Right 05/18/2015    Procedure: TOTAL KNEE ARTHROPLASTY; RIGHT KNEE;  Surgeon: Elsie Saas, MD;  Location: [facility];  Service: Orthopedics;  Laterality: Right;    No prescriptions prior to admission   Allergies  Allergen Reactions  . Contrast Media [Iodinated Diagnostic Agents] Other (See Comments)    Renal function decreased   . Etodolac Other (See Comments)    Pancreatitis   . Z-Pak [Azithromycin] Other (See Comments)    Pancreatitis   . Dilaudid [Hydromorphone Hcl] Other (See Comments)     hallucinations  . Hydrocodone Nausea And Vomiting  . Oxycodone Other (See Comments)    Severe  constipation  . Sulfa Antibiotics Itching and Rash    Social History  Substance Use Topics  . Smoking status: Never Smoker   . Smokeless tobacco: Never Used  . Alcohol Use: No    Family History  Problem Relation Age of Onset  . Diabetes Father   . Hypertension Father   . Aneurysm Mother   . Cancer - Other Sister      Review  of Systems  Constitutional: Negative.   HENT: Negative.   Eyes: Negative.   Respiratory: Negative.   Cardiovascular: Negative.   Gastrointestinal: Negative.   Genitourinary: Negative.   Musculoskeletal: Positive for back pain and joint pain.  Skin: Negative.   Neurological: Negative.   Endo/Heme/Allergies: Negative.   Psychiatric/Behavioral: Negative.     Objective:  Physical Exam  Constitutional: She is oriented to person, place, and time. She appears well-developed and well-nourished.  HENT:  Head: Normocephalic and atraumatic.  Mouth/Throat: Oropharynx is clear and moist.  Eyes: Conjunctivae are normal. Pupils are equal, round, and reactive to light.  Cardiovascular: Normal rate and regular rhythm.   Respiratory: Effort normal and breath sounds normal.  GI: Soft.  Genitourinary:  Not pertinent to current symptomatology therefore not examined.  Musculoskeletal:  Examination of her right knee reveals well healed total knee incision without swelling or pain.  Range of motion 0-120 degrees.  Knee is stable with normal patella tracking.  Examination of her left knee reveals diffuse pain.  1+ synovitis.  Range of motion 0-120 degrees.  Knee is stable with normal patella tracking.  Vascular exam: She has diffuse venous varicose veins, but no obvious venous stasis disease.    Neurological: She is alert and oriented to person, place, and time.  Skin: Skin is warm and dry.    Vital signs in last 24 hours: Temp:  [98 F (36.7 C)] 98 F (36.7 C) (07/05 1500) Pulse Rate:  [71] 71 (07/05 1500) BP: (151)/(81) 151/81 mmHg (07/05 1500) SpO2:  [97 %] 97 % (07/05 1500) Weight:  [108.863 kg (240 lb)] 108.863 kg (240 lb) (07/05 1500)  Labs:   Estimated body mass index is 38.76 kg/(m^2) as calculated from the following:   Height as of this encounter: 5\' 6"  (1.676 m).   Weight as of this encounter: 108.863 kg (240 lb).   Imaging Review Plain radiographs demonstrate severe degenerative  joint disease of the left knee(s). The overall alignment issignificant varus. The bone quality appears to be good for age and reported activity level.  Assessment/Plan:  End stage arthritis, left knee  Principal Problem:   Primary localized osteoarthritis of left knee Active Problems:   HTN (hypertension)   Obesity   Hx of pancreatitis   Kidney disease, chronic, stage III (GFR 30-59 ml/min)   The patient history, physical examination, clinical judgment of the provider and imaging studies are consistent with end stage degenerative joint disease of the left knee(s) and total knee arthroplasty is deemed medically necessary. The treatment options including medical management, injection therapy arthroscopy and arthroplasty were discussed at length. The risks and benefits of total knee arthroplasty were presented and reviewed. The risks due to aseptic loosening, infection, stiffness, patella tracking problems, thromboembolic complications and other imponderables were discussed. The patient acknowledged the explanation, agreed to proceed with the plan and consent was signed. Patient is being admitted for inpatient treatment for surgery, pain control, PT, OT, prophylactic antibiotics, VTE prophylaxis, progressive ambulation and ADL's and discharge planning. The patient is planning to be discharged home with home  health services

## 2015-11-05 ENCOUNTER — Encounter (HOSPITAL_COMMUNITY)
Admission: RE | Admit: 2015-11-05 | Discharge: 2015-11-05 | Disposition: A | Discharge status: Home / Self Care | Payer: BC Managed Care – PPO | Source: Ambulatory Visit | Attending: Orthopedic Surgery | Admitting: Orthopedic Surgery

## 2015-11-05 ENCOUNTER — Encounter (HOSPITAL_COMMUNITY): Payer: Self-pay

## 2015-11-05 DIAGNOSIS — M1712 Unilateral primary osteoarthritis, left knee: Secondary | ICD-10-CM | POA: Diagnosis not present

## 2015-11-05 DIAGNOSIS — Z0183 Encounter for blood typing: Secondary | ICD-10-CM | POA: Diagnosis not present

## 2015-11-05 DIAGNOSIS — N183 Chronic kidney disease, stage 3 (moderate): Secondary | ICD-10-CM | POA: Insufficient documentation

## 2015-11-05 DIAGNOSIS — K219 Gastro-esophageal reflux disease without esophagitis: Secondary | ICD-10-CM | POA: Diagnosis not present

## 2015-11-05 DIAGNOSIS — Z79899 Other long term (current) drug therapy: Secondary | ICD-10-CM | POA: Insufficient documentation

## 2015-11-05 DIAGNOSIS — Z01812 Encounter for preprocedural laboratory examination: Secondary | ICD-10-CM | POA: Diagnosis not present

## 2015-11-05 DIAGNOSIS — I129 Hypertensive chronic kidney disease with stage 1 through stage 4 chronic kidney disease, or unspecified chronic kidney disease: Secondary | ICD-10-CM | POA: Insufficient documentation

## 2015-11-05 DIAGNOSIS — Z01818 Encounter for other preprocedural examination: Secondary | ICD-10-CM | POA: Diagnosis not present

## 2015-11-05 LAB — CBC WITH DIFFERENTIAL/PLATELET
BASOS PCT: 0 %
Basophils Absolute: 0 10*3/uL (ref 0.0–0.1)
EOS ABS: 0.5 10*3/uL (ref 0.0–0.7)
EOS PCT: 4 %
HCT: 39 % (ref 36.0–46.0)
HEMOGLOBIN: 12.6 g/dL (ref 12.0–15.0)
LYMPHS ABS: 2.3 10*3/uL (ref 0.7–4.0)
Lymphocytes Relative: 19 %
MCH: 30.1 pg (ref 26.0–34.0)
MCHC: 32.3 g/dL (ref 30.0–36.0)
MCV: 93.1 fL (ref 78.0–100.0)
MONO ABS: 0.7 10*3/uL (ref 0.1–1.0)
MONOS PCT: 6 %
Neutro Abs: 8.4 10*3/uL — ABNORMAL HIGH (ref 1.7–7.7)
Neutrophils Relative %: 71 %
Platelets: 371 10*3/uL (ref 150–400)
RBC: 4.19 MIL/uL (ref 3.87–5.11)
RDW: 14.5 % (ref 11.5–15.5)
WBC: 11.9 10*3/uL — ABNORMAL HIGH (ref 4.0–10.5)

## 2015-11-05 LAB — COMPREHENSIVE METABOLIC PANEL
ALK PHOS: 111 U/L (ref 38–126)
ALT: 18 U/L (ref 14–54)
ANION GAP: 8 (ref 5–15)
AST: 22 U/L (ref 15–41)
Albumin: 3.9 g/dL (ref 3.5–5.0)
BUN: 22 mg/dL — ABNORMAL HIGH (ref 6–20)
CO2: 25 mmol/L (ref 22–32)
CREATININE: 1.63 mg/dL — AB (ref 0.44–1.00)
Calcium: 9.3 mg/dL (ref 8.9–10.3)
Chloride: 105 mmol/L (ref 101–111)
GFR calc non Af Amer: 32 mL/min — ABNORMAL LOW (ref >60)
GFR, EST AFRICAN AMERICAN: 37 mL/min — AB (ref >60)
Glucose, Bld: 91 mg/dL (ref 65–99)
POTASSIUM: 3.7 mmol/L (ref 3.5–5.1)
SODIUM: 138 mmol/L (ref 135–145)
Total Bilirubin: 0.8 mg/dL (ref 0.3–1.2)
Total Protein: 7.2 g/dL (ref 6.5–8.1)

## 2015-11-05 LAB — TYPE AND SCREEN
ABO/RH(D): A POS
Antibody Screen: NEGATIVE

## 2015-11-05 LAB — PROTIME-INR
INR: 1.13 (ref 0.00–1.49)
PROTHROMBIN TIME: 14.7 s (ref 11.6–15.2)

## 2015-11-05 LAB — APTT: aPTT: 34 seconds (ref 24–37)

## 2015-11-05 LAB — SURGICAL PCR SCREEN
MRSA, PCR: NEGATIVE
STAPHYLOCOCCUS AUREUS: NEGATIVE

## 2015-11-05 NOTE — Progress Notes (Signed)
PCP: Dr. Sinda Du Cardiologist: pt with no cardiologist currently but saw Dr. Johnsie Cancel 05/12/15 for cardiac clearance prior to previous surgery.  No echo, stress test, cath EKG: 05/08/15 CXR: 05/20/15  Pt with no complaints of chest pain, sob, cough, fever.

## 2015-11-05 NOTE — Pre-Procedure Instructions (Signed)
Deanna [name]  11/05/2015      [facility] [place], [place] [id number] [place] [id number] Phone: [phone] Fax: [phone]    Your procedure is scheduled on Monday, July 17.   Report to [facility] Admitting at 5:30 A.M.   Call this number if you have problems the morning of surgery:  [phone]   Remember:  Do not eat food or drink liquids after midnight.  Take these medicines the morning of surgery with A SIP OF WATER: acetaminophen (tylenol) if needed, amlodipine (norvasc), oxycodone if needed, ranitidine (zantac)  7 days prior to surgery (July 10): STOP taking any Aspirin, Aleve, Naproxen, Ibuprofen, Motrin, Advil, Goody's, BC's, all herbal medications, fish oil, and all vitamins (CO-Q10, Krill oil, multivitamin, Vitmain B-12)    Do not wear jewelry, make-up or nail polish.  Do not wear lotions, powders, or perfumes.  You may wear deoderant.  Do not shave 48 hours prior to surgery.  Men may shave face and neck.  Do not bring valuables to the hospital.  [facility] is not responsible for any belongings or valuables.  Contacts, dentures or bridgework may not be worn into surgery.  Leave your suitcase in the car.  After surgery it may be brought to your room.  For patients admitted to the hospital, discharge time will be determined by your treatment team.  Patients discharged the day of surgery will not be allowed to drive home.    Special instructions:    [place]- Preparing For Surgery  Before surgery, you can play an important role. Because skin is not sterile, your skin needs to be as free of germs as possible. You can reduce the number of germs on your skin by washing with CHG (chlorahexidine gluconate) Soap before surgery.  CHG is an antiseptic cleaner which kills germs and bonds with the skin to continue killing germs even after washing.  Please do not use if you have an allergy to CHG or  antibacterial soaps. If your skin becomes reddened/irritated stop using the CHG.  Do not shave (including legs and underarms) for at least 48 hours prior to first CHG shower. It is OK to shave your face.  Please follow these instructions carefully.   1. Shower the NIGHT BEFORE SURGERY and the MORNING OF SURGERY with CHG.   2. If you chose to wash your hair, wash your hair first as usual with your normal shampoo.  3. After you shampoo, rinse your hair and body thoroughly to remove the shampoo.  4. Use CHG as you would any other liquid soap. You can apply CHG directly to the skin and wash gently with a scrungie or a clean washcloth.   5. Apply the CHG Soap to your body ONLY FROM THE NECK DOWN.  Do not use on open wounds or open sores. Avoid contact with your eyes, ears, mouth and genitals (private parts). Wash genitals (private parts) with your normal soap.  6. Wash thoroughly, paying special attention to the area where your surgery will be performed.  7. Thoroughly rinse your body with warm water from the neck down.  8. DO NOT shower/wash with your normal soap after using and rinsing off the CHG Soap.  9. Pat yourself dry with a CLEAN TOWEL.   10. Wear CLEAN PAJAMAS   11. Place CLEAN SHEETS on your bed the night of your first shower and DO NOT SLEEP WITH PETS.  Day of Surgery: Do not apply any deodorants/lotions. Please wear clean clothes to the hospital/surgery center.      Please read over the following fact sheets that you were given. Coughing and Deep Breathing and MRSA Information

## 2015-11-06 LAB — URINE CULTURE

## 2015-11-06 NOTE — Progress Notes (Signed)
Anesthesia Chart Review: Patient is a 65 year old female scheduled for left TKA on 11/16/15 by Dr. Noemi Chapel. She is s/p right TKA on 05/18/15.  History includes nonsmoker, hypertension, GERD, hiatal hernia, pancreatitis (possible drug induced), CKD stage III. BMI is consistent with obesity.   - PCP is Dr. Luan Pulling in [place].  - She saw nephrologist Dr. Lowanda Foster during her 06/2013 admission for pancreatitis with acute kidney injury (peak Cr 2.25), possible dye-induced.  - GI is Dr. Laural Golden.  - She saw cardiologist Dr. Johnsie Cancel on 05/12/15 for a pre-operative evaluation for her right TKA. He felt anterior infarct changes were due to lead placement and body habitus. He cleared her without further testing and PRN cardiology follow-up.  Meds include amlodipine, Lipitor, krill oil, magnesium oxide, melatonin, Oxy IR, Zantac.   PAT Vitals: BP 141/70, HR 71, RR 20, O2 sat 100%, T 36.3 C.  05/08/15 EKG: NSR, cannot rule out anterior infarct (age undetermined).  05/20/15 CXR: IMPRESSION: No edema or consolidation.  Preoperative labs noted. BUN 22, Cr 1.63 (Cr range 1/32-1.84 since 06/2013). WBC 11.9, H/H 12.6/39.0. PT/PTT WNL. Urine culture showed multiple species present, suggest recollection.   She tolerated similar procedure earlier this year. If no acute changes then I would anticipate that she could proceed as planned. Surgeon will need to monitor renal function post-operatively.  George Hugh [facility] Short Stay Center/Anesthesiology Phone [phone] 11/06/2015 3:40 PM

## 2015-11-13 MED ORDER — CEFAZOLIN SODIUM-DEXTROSE 2-4 GM/100ML-% IV SOLN
2.0000 g | INTRAVENOUS | Status: AC
  Administered 2015-11-16: 2 g via INTRAVENOUS
  Filled 2015-11-13: qty 100

## 2015-11-16 ENCOUNTER — Inpatient Hospital Stay (HOSPITAL_COMMUNITY): Payer: BC Managed Care – PPO | Admitting: Emergency Medicine

## 2015-11-16 ENCOUNTER — Inpatient Hospital Stay (HOSPITAL_COMMUNITY)
Admission: RE | Admit: 2015-11-16 | Discharge: 2015-11-17 | DRG: 470 | Disposition: A | Discharge status: Home Health | Payer: BC Managed Care – PPO | Source: Ambulatory Visit | Attending: Orthopedic Surgery | Admitting: Orthopedic Surgery

## 2015-11-16 ENCOUNTER — Encounter (HOSPITAL_COMMUNITY): Payer: Self-pay | Admitting: General Practice

## 2015-11-16 ENCOUNTER — Encounter (HOSPITAL_COMMUNITY)
Admission: RE | Disposition: A | Discharge status: Home Health | Payer: Self-pay | Source: Ambulatory Visit | Attending: Orthopedic Surgery

## 2015-11-16 ENCOUNTER — Inpatient Hospital Stay (HOSPITAL_COMMUNITY): Payer: BC Managed Care – PPO | Admitting: Anesthesiology

## 2015-11-16 DIAGNOSIS — M1712 Unilateral primary osteoarthritis, left knee: Secondary | ICD-10-CM | POA: Diagnosis present

## 2015-11-16 DIAGNOSIS — Z885 Allergy status to narcotic agent status: Secondary | ICD-10-CM

## 2015-11-16 DIAGNOSIS — Z882 Allergy status to sulfonamides status: Secondary | ICD-10-CM | POA: Diagnosis not present

## 2015-11-16 DIAGNOSIS — E669 Obesity, unspecified: Secondary | ICD-10-CM | POA: Diagnosis present

## 2015-11-16 DIAGNOSIS — N183 Chronic kidney disease, stage 3 unspecified: Secondary | ICD-10-CM | POA: Diagnosis present

## 2015-11-16 DIAGNOSIS — Z91041 Radiographic dye allergy status: Secondary | ICD-10-CM | POA: Diagnosis not present

## 2015-11-16 DIAGNOSIS — K219 Gastro-esophageal reflux disease without esophagitis: Secondary | ICD-10-CM | POA: Diagnosis present

## 2015-11-16 DIAGNOSIS — Z8249 Family history of ischemic heart disease and other diseases of the circulatory system: Secondary | ICD-10-CM | POA: Diagnosis not present

## 2015-11-16 DIAGNOSIS — Z6837 Body mass index (BMI) 37.0-37.9, adult: Secondary | ICD-10-CM | POA: Diagnosis not present

## 2015-11-16 DIAGNOSIS — I129 Hypertensive chronic kidney disease with stage 1 through stage 4 chronic kidney disease, or unspecified chronic kidney disease: Secondary | ICD-10-CM | POA: Diagnosis present

## 2015-11-16 DIAGNOSIS — I10 Essential (primary) hypertension: Secondary | ICD-10-CM | POA: Diagnosis present

## 2015-11-16 DIAGNOSIS — Z888 Allergy status to other drugs, medicaments and biological substances status: Secondary | ICD-10-CM | POA: Diagnosis not present

## 2015-11-16 DIAGNOSIS — Z8719 Personal history of other diseases of the digestive system: Secondary | ICD-10-CM

## 2015-11-16 HISTORY — DX: Unilateral primary osteoarthritis, left knee: M17.12

## 2015-11-16 HISTORY — PX: TOTAL KNEE ARTHROPLASTY: SHX125

## 2015-11-16 SURGERY — ARTHROPLASTY, KNEE, TOTAL
Anesthesia: Spinal | Site: Knee | Laterality: Left

## 2015-11-16 MED ORDER — EPHEDRINE SULFATE 50 MG/ML IJ SOLN
INTRAMUSCULAR | Status: DC | PRN
  Administered 2015-11-16 (×3): 5 mg via INTRAVENOUS

## 2015-11-16 MED ORDER — ALUM & MAG HYDROXIDE-SIMETH 200-200-20 MG/5ML PO SUSP: 30.0000 mL | ORAL | Status: DC | PRN

## 2015-11-16 MED ORDER — MIDAZOLAM HCL 5 MG/5ML IJ SOLN
INTRAMUSCULAR | Status: DC | PRN
  Administered 2015-11-16: 2 mg via INTRAVENOUS

## 2015-11-16 MED ORDER — AMLODIPINE BESYLATE 10 MG PO TABS: 10.0000 mg | ORAL_TABLET | Freq: Every day | ORAL | Status: DC

## 2015-11-16 MED ORDER — DEXAMETHASONE SODIUM PHOSPHATE 10 MG/ML IJ SOLN
INTRAMUSCULAR | Status: AC
  Filled 2015-11-16: qty 1

## 2015-11-16 MED ORDER — DOCUSATE SODIUM 100 MG PO CAPS
100.0000 mg | ORAL_CAPSULE | Freq: Two times a day (BID) | ORAL | Status: DC
  Administered 2015-11-16 – 2015-11-17 (×3): 100 mg via ORAL
  Filled 2015-11-16 (×3): qty 1

## 2015-11-16 MED ORDER — ATORVASTATIN CALCIUM 20 MG PO TABS
20.0000 mg | ORAL_TABLET | Freq: Every day | ORAL | Status: DC
  Administered 2015-11-16 – 2015-11-17 (×2): 20 mg via ORAL
  Filled 2015-11-16 (×2): qty 1

## 2015-11-16 MED ORDER — RANITIDINE HCL 150 MG PO TABS
150.0000 mg | ORAL_TABLET | Freq: Three times a day (TID) | ORAL | Status: DC
  Administered 2015-11-16 – 2015-11-17 (×4): 150 mg via ORAL
  Filled 2015-11-16 (×6): qty 1

## 2015-11-16 MED ORDER — FENTANYL CITRATE (PF) 100 MCG/2ML IJ SOLN
INTRAMUSCULAR | Status: AC
  Filled 2015-11-16: qty 2

## 2015-11-16 MED ORDER — CHLORHEXIDINE GLUCONATE 4 % EX LIQD: 60.0000 mL | Freq: Once | CUTANEOUS | Status: DC

## 2015-11-16 MED ORDER — BUPIVACAINE-EPINEPHRINE (PF) 0.5% -1:200000 IJ SOLN
INTRAMUSCULAR | Status: DC | PRN
  Administered 2015-11-16: 30 mL via PERINEURAL

## 2015-11-16 MED ORDER — ACETAMINOPHEN 325 MG PO TABS: 650.0000 mg | ORAL_TABLET | Freq: Four times a day (QID) | ORAL | Status: DC | PRN

## 2015-11-16 MED ORDER — METOCLOPRAMIDE HCL 5 MG PO TABS: 5.0000 mg | ORAL_TABLET | Freq: Three times a day (TID) | ORAL | Status: DC | PRN

## 2015-11-16 MED ORDER — BUPIVACAINE-EPINEPHRINE (PF) 0.25% -1:200000 IJ SOLN
INTRAMUSCULAR | Status: AC
  Filled 2015-11-16: qty 30

## 2015-11-16 MED ORDER — FENTANYL CITRATE (PF) 100 MCG/2ML IJ SOLN
25.0000 ug | INTRAMUSCULAR | Status: DC | PRN
  Administered 2015-11-16 (×2): 50 ug via INTRAVENOUS

## 2015-11-16 MED ORDER — DEXAMETHASONE SODIUM PHOSPHATE 10 MG/ML IJ SOLN
10.0000 mg | Freq: Three times a day (TID) | INTRAMUSCULAR | Status: DC
  Administered 2015-11-16 – 2015-11-17 (×3): 10 mg via INTRAVENOUS
  Filled 2015-11-16 (×3): qty 1

## 2015-11-16 MED ORDER — PHENOL 1.4 % MT LIQD: 1.0000 | OROMUCOSAL | Status: DC | PRN

## 2015-11-16 MED ORDER — CEFUROXIME SODIUM 750 MG IJ SOLR
INTRAMUSCULAR | Status: AC
  Filled 2015-11-16: qty 750

## 2015-11-16 MED ORDER — MELATONIN 2.5 MG PO CAPS: 1.0000 | ORAL_CAPSULE | Freq: Every day | ORAL | Status: DC

## 2015-11-16 MED ORDER — BUPIVACAINE-EPINEPHRINE 0.25% -1:200000 IJ SOLN
INTRAMUSCULAR | Status: DC | PRN
  Administered 2015-11-16: 30 mL

## 2015-11-16 MED ORDER — POVIDONE-IODINE 7.5 % EX SOLN
Freq: Once | CUTANEOUS | Status: DC
  Filled 2015-11-16: qty 118

## 2015-11-16 MED ORDER — ONDANSETRON HCL 4 MG/2ML IJ SOLN: 4.0000 mg | Freq: Four times a day (QID) | INTRAMUSCULAR | Status: DC | PRN

## 2015-11-16 MED ORDER — LACTATED RINGERS IV SOLN: INTRAVENOUS | Status: DC

## 2015-11-16 MED ORDER — ONDANSETRON HCL 4 MG PO TABS: 4.0000 mg | ORAL_TABLET | Freq: Four times a day (QID) | ORAL | Status: DC | PRN

## 2015-11-16 MED ORDER — SACCHAROMYCES BOULARDII 250 MG PO CAPS
250.0000 mg | ORAL_CAPSULE | Freq: Two times a day (BID) | ORAL | Status: DC
  Administered 2015-11-16 – 2015-11-17 (×3): 250 mg via ORAL
  Filled 2015-11-16 (×3): qty 1

## 2015-11-16 MED ORDER — OXYCODONE HCL 5 MG PO TABS
5.0000 mg | ORAL_TABLET | ORAL | Status: DC | PRN
  Administered 2015-11-16 (×2): 10 mg via ORAL
  Administered 2015-11-16: 5 mg via ORAL
  Administered 2015-11-16 – 2015-11-17 (×4): 10 mg via ORAL
  Filled 2015-11-16: qty 1
  Filled 2015-11-16 (×8): qty 2

## 2015-11-16 MED ORDER — CEFUROXIME SODIUM 750 MG IJ SOLR
INTRAMUSCULAR | Status: DC | PRN
  Administered 2015-11-16: 1.5 g

## 2015-11-16 MED ORDER — POTASSIUM CHLORIDE IN NACL 20-0.9 MEQ/L-% IV SOLN
INTRAVENOUS | Status: DC
  Administered 2015-11-16: 100 mL via INTRAVENOUS
  Administered 2015-11-16: 23:00:00 via INTRAVENOUS
  Filled 2015-11-16 (×2): qty 1000

## 2015-11-16 MED ORDER — POLYETHYLENE GLYCOL 3350 17 G PO PACK
17.0000 g | PACK | Freq: Two times a day (BID) | ORAL | Status: DC
  Administered 2015-11-16 – 2015-11-17 (×3): 17 g via ORAL
  Filled 2015-11-16 (×3): qty 1

## 2015-11-16 MED ORDER — MAGNESIUM OXIDE 400 (241.3 MG) MG PO TABS
400.0000 mg | ORAL_TABLET | Freq: Two times a day (BID) | ORAL | Status: DC
  Administered 2015-11-16 – 2015-11-17 (×2): 400 mg via ORAL
  Filled 2015-11-16 (×2): qty 1

## 2015-11-16 MED ORDER — APIXABAN 2.5 MG PO TABS
2.5000 mg | ORAL_TABLET | Freq: Two times a day (BID) | ORAL | Status: DC
  Administered 2015-11-17: 2.5 mg via ORAL
  Filled 2015-11-16: qty 1

## 2015-11-16 MED ORDER — FENTANYL CITRATE (PF) 250 MCG/5ML IJ SOLN
INTRAMUSCULAR | Status: DC | PRN
  Administered 2015-11-16: 100 ug via INTRAVENOUS

## 2015-11-16 MED ORDER — CEFAZOLIN SODIUM-DEXTROSE 2-4 GM/100ML-% IV SOLN
2.0000 g | Freq: Four times a day (QID) | INTRAVENOUS | Status: AC
  Administered 2015-11-16 (×2): 2 g via INTRAVENOUS
  Filled 2015-11-16 (×2): qty 100

## 2015-11-16 MED ORDER — FENTANYL CITRATE (PF) 250 MCG/5ML IJ SOLN
INTRAMUSCULAR | Status: AC
  Filled 2015-11-16: qty 5

## 2015-11-16 MED ORDER — METOCLOPRAMIDE HCL 5 MG/ML IJ SOLN: 10.0000 mg | Freq: Once | INTRAMUSCULAR | Status: DC | PRN

## 2015-11-16 MED ORDER — CEFUROXIME SODIUM 750 MG IJ SOLR
INTRAMUSCULAR | Status: AC
  Filled 2015-11-16: qty 1500

## 2015-11-16 MED ORDER — PROPOFOL 10 MG/ML IV BOLUS
INTRAVENOUS | Status: AC
  Filled 2015-11-16: qty 20

## 2015-11-16 MED ORDER — NON FORMULARY: 150.0000 mg | Freq: Three times a day (TID) | Status: DC

## 2015-11-16 MED ORDER — MEPERIDINE HCL 25 MG/ML IJ SOLN: 6.2500 mg | INTRAMUSCULAR | Status: DC | PRN

## 2015-11-16 MED ORDER — METOCLOPRAMIDE HCL 5 MG/ML IJ SOLN: 5.0000 mg | Freq: Three times a day (TID) | INTRAMUSCULAR | Status: DC | PRN

## 2015-11-16 MED ORDER — DIPHENHYDRAMINE HCL 12.5 MG/5ML PO ELIX: 12.5000 mg | ORAL_SOLUTION | ORAL | Status: DC | PRN

## 2015-11-16 MED ORDER — EPHEDRINE 5 MG/ML INJ
INTRAVENOUS | Status: AC
  Filled 2015-11-16: qty 10

## 2015-11-16 MED ORDER — HYDROMORPHONE HCL 1 MG/ML IJ SOLN: 1.0000 mg | INTRAMUSCULAR | Status: DC | PRN

## 2015-11-16 MED ORDER — MIDAZOLAM HCL 2 MG/2ML IJ SOLN
INTRAMUSCULAR | Status: AC
  Filled 2015-11-16: qty 2

## 2015-11-16 MED ORDER — LACTATED RINGERS IV SOLN
INTRAVENOUS | Status: DC | PRN
  Administered 2015-11-16 (×2): via INTRAVENOUS

## 2015-11-16 MED ORDER — MELATONIN 3 MG PO TABS
3.0000 mg | ORAL_TABLET | Freq: Every day | ORAL | Status: DC
  Administered 2015-11-16: 3 mg via ORAL
  Filled 2015-11-16: qty 1

## 2015-11-16 MED ORDER — ACETAMINOPHEN 650 MG RE SUPP: 650.0000 mg | Freq: Four times a day (QID) | RECTAL | Status: DC | PRN

## 2015-11-16 MED ORDER — 0.9 % SODIUM CHLORIDE (POUR BTL) OPTIME
TOPICAL | Status: DC | PRN
  Administered 2015-11-16: 1000 mL

## 2015-11-16 MED ORDER — BUPIVACAINE IN DEXTROSE 0.75-8.25 % IT SOLN
INTRATHECAL | Status: DC | PRN
  Administered 2015-11-16: 2 mL via INTRATHECAL

## 2015-11-16 MED ORDER — SODIUM CHLORIDE 0.9 % IR SOLN
Status: DC | PRN
  Administered 2015-11-16: 3000 mL

## 2015-11-16 MED ORDER — MENTHOL 3 MG MT LOZG: 1.0000 | LOZENGE | OROMUCOSAL | Status: DC | PRN

## 2015-11-16 MED ORDER — PROPOFOL 500 MG/50ML IV EMUL
INTRAVENOUS | Status: DC | PRN
  Administered 2015-11-16: 100 ug/kg/min via INTRAVENOUS

## 2015-11-16 SURGICAL SUPPLY — 71 items
APL SKNCLS STERI-STRIP NONHPOA (GAUZE/BANDAGES/DRESSINGS) ×1
BANDAGE ESMARK 6X9 LF (GAUZE/BANDAGES/DRESSINGS) ×1 IMPLANT
BENZOIN TINCTURE PRP APPL 2/3 (GAUZE/BANDAGES/DRESSINGS) ×3 IMPLANT
BLADE SAGITTAL 25.0X1.19X90 (BLADE) ×2 IMPLANT
BLADE SAGITTAL 25.0X1.19X90MM (BLADE) ×1
BLADE SAW SGTL 13X75X1.27 (BLADE) ×3 IMPLANT
BLADE SURG 10 STRL SS (BLADE) ×6 IMPLANT
BNDG CMPR 9X6 STRL LF SNTH (GAUZE/BANDAGES/DRESSINGS) ×1
BNDG CMPR MED 15X6 ELC VLCR LF (GAUZE/BANDAGES/DRESSINGS) ×1
BNDG ELASTIC 6X15 VLCR STRL LF (GAUZE/BANDAGES/DRESSINGS) ×3 IMPLANT
BNDG ESMARK 6X9 LF (GAUZE/BANDAGES/DRESSINGS) ×3
BOWL SMART MIX CTS (DISPOSABLE) ×3 IMPLANT
CAPT KNEE TOTAL 3 ATTUNE ×2 IMPLANT
CEMENT HV SMART SET (Cement) ×6 IMPLANT
CLOSURE WOUND 1/2 X4 (GAUZE/BANDAGES/DRESSINGS) ×1
COVER SURGICAL LIGHT HANDLE (MISCELLANEOUS) ×3 IMPLANT
CUFF TOURNIQUET SINGLE 34IN LL (TOURNIQUET CUFF) ×3 IMPLANT
CUFF TOURNIQUET SINGLE 44IN (TOURNIQUET CUFF) IMPLANT
DECANTER SPIKE VIAL GLASS SM (MISCELLANEOUS) ×3 IMPLANT
DRAPE EXTREMITY T 121X128X90 (DRAPE) ×3 IMPLANT
DRAPE INCISE IOBAN 66X45 STRL (DRAPES) ×3 IMPLANT
DRAPE PROXIMA HALF (DRAPES) ×3 IMPLANT
DRAPE U-SHAPE 47X51 STRL (DRAPES) ×3 IMPLANT
DRSG AQUACEL AG ADV 3.5X14 (GAUZE/BANDAGES/DRESSINGS) ×3 IMPLANT
DURAPREP 26ML APPLICATOR (WOUND CARE) ×6 IMPLANT
ELECT CAUTERY BLADE 6.4 (BLADE) ×3 IMPLANT
ELECT REM PT RETURN 9FT ADLT (ELECTROSURGICAL) ×3
ELECTRODE REM PT RTRN 9FT ADLT (ELECTROSURGICAL) ×1 IMPLANT
FACESHIELD WRAPAROUND (MASK) ×3 IMPLANT
FACESHIELD WRAPAROUND OR TEAM (MASK) ×1 IMPLANT
GLOVE BIO SURGEON STRL SZ7 (GLOVE) ×3 IMPLANT
GLOVE BIOGEL PI IND STRL 7.0 (GLOVE) ×1 IMPLANT
GLOVE BIOGEL PI IND STRL 7.5 (GLOVE) ×1 IMPLANT
GLOVE BIOGEL PI INDICATOR 7.0 (GLOVE) ×2
GLOVE BIOGEL PI INDICATOR 7.5 (GLOVE) ×2
GLOVE SS BIOGEL STRL SZ 7.5 (GLOVE) ×1 IMPLANT
GLOVE SUPERSENSE BIOGEL SZ 7.5 (GLOVE) ×2
GOWN STRL REUS W/ TWL LRG LVL3 (GOWN DISPOSABLE) ×1 IMPLANT
GOWN STRL REUS W/ TWL XL LVL3 (GOWN DISPOSABLE) ×2 IMPLANT
GOWN STRL REUS W/TWL LRG LVL3 (GOWN DISPOSABLE) ×3
GOWN STRL REUS W/TWL XL LVL3 (GOWN DISPOSABLE) ×6
HANDPIECE INTERPULSE COAX TIP (DISPOSABLE) ×3
HOOD PEEL AWAY FACE SHEILD DIS (HOOD) ×6 IMPLANT
IMMOBILIZER KNEE 22 UNIV (SOFTGOODS) ×3 IMPLANT
KIT BASIN OR (CUSTOM PROCEDURE TRAY) ×3 IMPLANT
KIT ROOM TURNOVER OR (KITS) ×3 IMPLANT
MANIFOLD NEPTUNE II (INSTRUMENTS) ×3 IMPLANT
MARKER SKIN DUAL TIP RULER LAB (MISCELLANEOUS) ×3 IMPLANT
NDL 18GX1X1/2 (RX/OR ONLY) (NEEDLE) ×1 IMPLANT
NEEDLE 18GX1X1/2 (RX/OR ONLY) (NEEDLE) ×3 IMPLANT
NS IRRIG 1000ML POUR BTL (IV SOLUTION) ×3 IMPLANT
PACK TOTAL JOINT (CUSTOM PROCEDURE TRAY) ×3 IMPLANT
PAD ARMBOARD 7.5X6 YLW CONV (MISCELLANEOUS) ×6 IMPLANT
SET HNDPC FAN SPRY TIP SCT (DISPOSABLE) ×1 IMPLANT
STRIP CLOSURE SKIN 1/2X4 (GAUZE/BANDAGES/DRESSINGS) ×2 IMPLANT
SUCTION FRAZIER HANDLE 10FR (MISCELLANEOUS) ×2
SUCTION TUBE FRAZIER 10FR DISP (MISCELLANEOUS) ×1 IMPLANT
SUT MNCRL AB 3-0 PS2 18 (SUTURE) ×3 IMPLANT
SUT VIC AB 0 CT1 27 (SUTURE) ×9
SUT VIC AB 0 CT1 27XBRD ANBCTR (SUTURE) ×2 IMPLANT
SUT VIC AB 1 CT1 27 (SUTURE) ×3
SUT VIC AB 1 CT1 27XBRD ANBCTR (SUTURE) ×1 IMPLANT
SUT VIC AB 2-0 CT1 27 (SUTURE) ×6
SUT VIC AB 2-0 CT1 TAPERPNT 27 (SUTURE) ×2 IMPLANT
SYR 30ML LL (SYRINGE) ×3 IMPLANT
TOWEL OR 17X24 6PK STRL BLUE (TOWEL DISPOSABLE) ×3 IMPLANT
TOWEL OR 17X26 10 PK STRL BLUE (TOWEL DISPOSABLE) ×3 IMPLANT
TRAY FOLEY CATH 16FR SILVER (SET/KITS/TRAYS/PACK) ×3 IMPLANT
TUBE CONNECTING 12'X1/4 (SUCTIONS) ×1
TUBE CONNECTING 12X1/4 (SUCTIONS) ×2 IMPLANT
YANKAUER SUCT BULB TIP NO VENT (SUCTIONS) ×3 IMPLANT

## 2015-11-16 NOTE — Op Note (Signed)
MRN:     [id number] DOB/AGE:    65-Jul-1952 / 65 y.o.       OPERATIVE REPORT    DATE OF PROCEDURE:  11/16/2015       PREOPERATIVE DIAGNOSIS:  PRIMARY LOCALIZED OA LEFT KNEE      Estimated body mass index is 37.27 kg/(m^2) as calculated from the following:   Height as of this encounter: 5\' 7"  (1.702 m).   Weight as of this encounter: 107.956 kg (238 lb).                                                        POSTOPERATIVE DIAGNOSIS:   SAME                                                                      PROCEDURE:  Procedure(s): LEFT TOTAL KNEE ARTHROPLASTY Using Depuy Attune RP implants #6 Femur, #6Tibia, 46mm  RP bearing, 35 Patella     SURGEON: Sircharles Holzheimer A    ASSISTANT:  Kirstin Shepperson PA-C   (Present and scrubbed throughout the case, critical for assistance with exposure, retraction, instrumentation, and closure.)         ANESTHESIA: Spinal  with Adductor Nerve Block     TOURNIQUET TIME: 0000000   COMPLICATIONS:  None     SPECIMENS: None   INDICATIONS FOR PROCEDURE: The patient has  OA LEFT KNEE, varus deformities, XR shows bone on bone arthritis. Patient has failed all conservative measures including anti-inflammatory medicines, narcotics, attempts at  exercise and weight loss, cortisone injections and viscosupplementation.  Risks and benefits of surgery have been discussed, questions answered.   DESCRIPTION OF PROCEDURE: The patient identified by armband, received  right femoral nerve block and IV antibiotics, in the holding area at [facility]. Patient taken to the operating room, appropriate anesthetic  monitors were attached General endotracheal anesthesia induced with  the patient in supine position, Foley catheter was inserted. Tourniquet  applied high to the operative thigh. Lateral post and foot positioner  applied to the table, the lower extremity was then prepped and draped  in usual sterile fashion from the ankle to the tourniquet. Time-out procedure  was performed. The limb was wrapped with an Esmarch bandage and the tourniquet inflated to 365 mmHg. We began the operation by making the anterior midline incision starting at handbreadth above the patella going over the patella 1 cm medial to and  4 cm distal to the tibial tubercle. Small bleeders in the skin and the  subcutaneous tissue identified and cauterized. Transverse retinaculum was incised and reflected medially and a medial parapatellar arthrotomy was accomplished. the patella was everted and theprepatellar fat pad resected. The superficial medial collateral  ligament was then elevated from anterior to posterior along the proximal  flare of the tibia and anterior half of the menisci resected. The knee was hyperflexed exposing bone on bone arthritis. Peripheral and notch osteophytes as well as the cruciate ligaments were then resected. We continued to  work our way around posteriorly along the proximal tibia, and externally  rotated the tibia subluxing  it out from underneath the femur. A McHale  retractor was placed through the notch and a lateral Hohmann retractor  placed, and we then drilled through the proximal tibia in line with the  axis of the tibia followed by an intramedullary guide rod and 2-degree  posterior slope cutting guide. The tibial cutting guide was pinned into place  allowing resection of 4 mm of bone medially and about 6 mm of bone  laterally because of her varus deformity. Satisfied with the tibial resection, we then  entered the distal femur 2 mm anterior to the PCL origin with the  intramedullary guide rod and applied the distal femoral cutting guide  set at 47mm, with 5 degrees of valgus. This was pinned along the  epicondylar axis. At this point, the distal femoral cut was accomplished without difficulty. We then sized for a #6 femoral component and pinned the guide in 3 degrees of external rotation.The chamfer cutting guide was pinned into place. The anterior,  posterior, and chamfer cuts were accomplished without difficulty followed by  the  RP box cutting guide and the box cut. We also removed posterior osteophytes from the posterior femoral condyles. At this  time, the knee was brought into full extension. We checked our  extension and flexion gaps and found them symmetric at 71mm.  The patella thickness measured at 25 mm. We set the cutting guide at 15 and removed the posterior 9.5-10 mm  of the patella sized for 35 button and drilled the lollipop. The knee  was then once again hyperflexed exposing the proximal tibia. We sized for a #6 tibial base plate, applied the smokestack and the conical reamer followed by the the Delta fin keel punch. We then hammered into place the  RP trial femoral component, inserted a 1 trial bearing, trial patellar button, and took the knee through range of motion from 0-130 degrees. No thumb pressure was required for patellar  tracking. At this point, all trial components were removed, a double batch of DePuy HV cement with 1500 mg of Zinacef was mixed and applied to all bony metallic mating surfaces except for the posterior condyles of the femur itself. In order, we  hammered into place the tibial tray and removed excess cement, the femoral component and removed excess cement, a 48mm  RP bearing  was inserted, and the knee brought to full extension with compression.  The patellar button was clamped into place, and excess cement  removed. While the cement cured the wound was irrigated out with normal saline solution pulse lavage.. Ligament stability and patellar tracking were checked and found to be excellent.. The parapatellar arthrotomy was closed with  #1 Vicryl suture. The subcutaneous tissue with 0 and 2-0 undyed  Vicryl suture, and 4-0 Monocryl.. A dressing of Aquaseal,  4 x 4, dressing sponges, Webril, and Ace wrap applied. Needle and sponge count were correct times 2.The patient awakened, extubated, and taken to recovery  room without difficulty. Vascular status was normal, pulses 2+ and symmetric.   Carle Fenech A 11/16/2015, 8:58 AM

## 2015-11-16 NOTE — Anesthesia Procedure Notes (Addendum)
Spinal Patient location during procedure: OR Staffing Anesthesiologist: Montez Hageman Performed by: anesthesiologist  Preanesthetic Checklist Completed: patient identified, site marked, surgical consent, pre-op evaluation, timeout performed, IV checked, risks and benefits discussed and monitors and equipment checked Spinal Block Patient position: sitting Prep: Betadine Patient monitoring: heart rate, continuous pulse ox and blood pressure Approach: right paramedian Location: L4-5 Injection technique: single-shot Needle Needle type: Sprotte  Needle gauge: 24 G Needle length: 9 cm Additional Notes Expiration date of kit checked and confirmed. Patient tolerated procedure well, without complications.    Anesthesia Regional Block:  Adductor canal block  Pre-Anesthetic Checklist: ,, timeout performed, Correct Patient, Correct Site, Correct Laterality, Correct Procedure, Correct Position, site marked, Risks and benefits discussed,  Surgical consent,  Pre-op evaluation,  At surgeon's request and post-op pain management  Laterality: Left and Lower  Prep: Maximum Sterile Barrier Precautions used and chloraprep       Needles:  Injection technique: Single-shot  Needle Type: Echogenic Stimulator Needle     Needle Length: 10cm 10 cm Needle Gauge: 21 and 21 G    Additional Needles:  Procedures: ultrasound guided (picture in chart) Adductor canal block Narrative:  Injection made incrementally with aspirations every 5 mL.  Performed by: Personally   Additional Notes: Patient tolerated the procedure well without complications

## 2015-11-16 NOTE — Evaluation (Signed)
Physical Therapy Evaluation Patient Details Name: Deanna [name] MRN: [id number] DOB: 11-29-1950 Today's Date: 11/16/2015   History of Present Illness  Deanna [name], 65 y.o. 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 Patient was admitted for left total knee arthroplasty.  Clinical Impression  Pt was admitted with the above. She was instructed on knee precautions including no pillows or wedges behind knee and use of the 0 degree foam for ankle. Pt performed bilateral ankle pumps, quad sets, and glut sets. She demonstrated good quad activation therefore knee immobilizer was not used. Pt ambulated 200 ft min guard with rolling walker and chair follow. She was educated on bed mobility for getting back into bed (see below). Pt will be going home with her husband. Continue acute rehab for ROM, strengthening, and gait training.     Follow Up Recommendations Home health PT;Supervision for mobility/OOB    Equipment Recommendations  None recommended by PT (Pt has RW at home.)    Recommendations for Other Services Rehab consult     Precautions / Restrictions Precautions Precautions: Knee Precaution Comments: No pillow behind L knee, use of 0 degree foam. Restrictions Weight Bearing Restrictions: Yes LLE Weight Bearing: Weight bearing as tolerated      Mobility  Bed Mobility Overal bed mobility: Needs Assistance Bed Mobility: Supine to Sit     Supine to sit: Supervision     General bed mobility comments: Pt instructed on methods to get LLE back into bed (hands, sheet, hooking R foot under LLE)  Transfers Overall transfer level: Needs assistance Equipment used: Rolling walker (2 wheeled) Transfers: Sit to/from Stand Sit to Stand: Min assist         General transfer comment: Pt verbally cued to push off bed with hands while powering up.  Ambulation/Gait Ambulation/Gait assistance: Min guard (with chair follow) Ambulation Distance (Feet): 200 Feet Assistive device:  Rolling walker (2 wheeled) Gait Pattern/deviations: Step-through pattern;Decreased stride length   Gait velocity interpretation: Below normal speed for age/gender General Gait Details: Pt instructed to activate L quads during stance and for increased knee flexion during swing phase. Pt ambulated without knee immobilizer.  Stairs            Wheelchair Mobility    Modified Rankin (Stroke Patients Only)       Balance Overall balance assessment: Needs assistance Sitting-balance support: Feet supported;No upper extremity supported Sitting balance-Leahy Scale: Good     Standing balance support: Bilateral upper extremity supported;During functional activity Standing balance-Leahy Scale: Fair                               Pertinent Vitals/Pain Pain Assessment: 0-10 Pain Location: surgical site Pain Intervention(s): Monitored during session    Home Living Family/patient expects to be discharged to:: Private residence Living Arrangements: Spouse/significant other Available Help at Discharge: Family Type of Home: House Home Access: Stairs to enter   Technical brewer of Steps: 3 Home Layout: One level Home Equipment: Environmental consultant - 2 wheels;Shower seat - built in      Prior Function Level of Independence: Independent               Hand Dominance        Extremity/Trunk Assessment   Upper Extremity Assessment: Overall WFL for tasks assessed           Lower Extremity Assessment: LLE deficits/detail   LLE Deficits / Details: AROM and strength  were limited due to post-op limitations. Pt was able to perform seated L long arc quad and no knee buckling was noted during ambulation.     Communication   Communication: No difficulties  Cognition Arousal/Alertness: Awake/alert Behavior During Therapy: WFL for tasks assessed/performed Overall Cognitive Status: Within Functional Limits for tasks assessed                      General Comments       Exercises Total Joint Exercises Ankle Circles/Pumps: AROM;10 reps;Both;Seated Quad Sets: AROM;Both;10 reps;Seated Gluteal Sets: AROM;Both;10 reps;Seated      Assessment/Plan    PT Assessment Patient needs continued PT services  PT Diagnosis Abnormality of gait;Generalized weakness;Difficulty walking;Acute pain   PT Problem List Decreased strength;Decreased range of motion;Decreased activity tolerance;Decreased balance;Decreased mobility;Decreased coordination;Decreased knowledge of use of DME;Decreased safety awareness;Decreased knowledge of precautions;Pain  PT Treatment Interventions DME instruction;Gait training;Stair training;Functional mobility training;Therapeutic activities;Therapeutic exercise;Balance training;Neuromuscular re-education;Patient/family education   PT Goals (Current goals can be found in the Care Plan section) Acute Rehab PT Goals Patient Stated Goal: To go home tomorrow. PT Goal Formulation: With patient Time For Goal Achievement: 11/23/15 Potential to Achieve Goals: Good    Frequency 7X/week   Barriers to discharge        Co-evaluation               End of Session Equipment Utilized During Treatment: Gait belt Activity Tolerance: Patient tolerated treatment well Patient left: in chair;with call bell/phone within reach;with chair alarm set Nurse Communication: Mobility status         Time: [id number] PT Time Calculation (min) (ACUTE ONLY): 33 min   Charges:   PT Evaluation $PT Eval Low Complexity: 1 Procedure PT Treatments $Gait Training: 8-22 mins   PT G CodesDaymon Larsen 12-04-2015, 4:57 PM  Tawni Millers, SPT (student physical therapist) Acute Rehabilitation Services [phone]

## 2015-11-16 NOTE — Progress Notes (Signed)
Orthopedic Tech Progress Note Patient Details:  Deanna [name] 06-08-1950 [id number] Ortho visit put on cpm at 1810 Patient ID: Neita Garnet, female   DOB: [birth year], 65 y.o.   MRN: [id number]   Braulio Bosch 11/16/2015, 6:11 PM

## 2015-11-16 NOTE — Interval H&P Note (Signed)
History and Physical Interval Note:  11/16/2015 7:04 AM  Deanna [name]  has presented today for surgery, with the diagnosis of PRIMARY LOCALIZED OA LEFT KNEE  The various methods of treatment have been discussed with the patient and family. After consideration of risks, benefits and other options for treatment, the patient has consented to  Procedure(s): LEFT TOTAL KNEE ARTHROPLASTY (Left) as a surgical intervention .  The patient's history has been reviewed, patient examined, no change in status, stable for surgery.  I have reviewed the patient's chart and labs.  Questions were answered to the patient's satisfaction.     Elsie Saas A

## 2015-11-16 NOTE — Transfer of Care (Signed)
Immediate Anesthesia Transfer of Care Note  Patient: EVALIE RIDENHOUR  Procedure(s) Performed: Procedure(s): LEFT TOTAL KNEE ARTHROPLASTY (Left)  Patient Location: PACU  Anesthesia Type:Spinal  Level of Consciousness: awake, alert  and oriented  Airway & Oxygen Therapy: Patient Spontanous Breathing and Patient connected to face mask oxygen  Post-op Assessment: Report given to RN and Post -op Vital signs reviewed and stable  Post vital signs: Reviewed and stable  Last Vitals:  Filed Vitals:   11/04/15 1500 11/16/15 0639  BP: 151/81 161/61  Pulse: 71 64  Temp: 36.7 C 36.8 C  Resp:  20    Last Pain: There were no vitals filed for this visit.    Patients Stated Pain Goal: 4 ([id number])  Complications: No apparent anesthesia complications

## 2015-11-16 NOTE — Anesthesia Preprocedure Evaluation (Addendum)
Anesthesia Evaluation  Patient identified by MRN, date of birth, ID band Patient awake    Reviewed: Allergy & Precautions, H&P , NPO status , Patient's Chart, lab work & pertinent test results  Airway Mallampati: II  TM Distance: >3 FB Neck ROM: Full    Dental no notable dental hx.    Pulmonary neg pulmonary ROS,    Pulmonary exam normal breath sounds clear to auscultation       Cardiovascular hypertension, Pt. on medications Normal cardiovascular exam Rhythm:Regular Rate:Normal     Neuro/Psych negative neurological ROS  negative psych ROS   GI/Hepatic Neg liver ROS, hiatal hernia, GERD  ,  Endo/Other  negative endocrine ROS  Renal/GU Renal InsufficiencyRenal disease     Musculoskeletal  (+) Arthritis ,   Abdominal   Peds  Hematology negative hematology ROS (+)   Anesthesia Other Findings   Reproductive/Obstetrics negative OB ROS                           Anesthesia Physical  Anesthesia Plan  ASA: II  Anesthesia Plan: Spinal   Post-op Pain Management:    Induction:   Airway Management Planned: Simple Face Mask  Additional Equipment:   Intra-op Plan:   Post-operative Plan:   Informed Consent: I have reviewed the patients History and Physical, chart, labs and discussed the procedure including the risks, benefits and alternatives for the proposed anesthesia with the patient or authorized representative who has indicated his/her understanding and acceptance.   Dental advisory given  Plan Discussed with: CRNA  Anesthesia Plan Comments:        Anesthesia Quick Evaluation

## 2015-11-16 NOTE — Progress Notes (Signed)
Orthopedic Tech Progress Note Patient Details:  Deanna [name] 18-May-1950 [id number]  CPM Left Knee CPM Left Knee: On Left Knee Flexion (Degrees): 9 Left Knee Extension (Degrees): 0 Additional Comments: trapeze bar patient hel;per Viewed order from doctor's order list  Hildred Priest 11/16/2015, 10:01 AM

## 2015-11-17 ENCOUNTER — Encounter (HOSPITAL_COMMUNITY): Payer: Self-pay | Admitting: General Practice

## 2015-11-17 LAB — CBC
HEMATOCRIT: 32.6 % — AB (ref 36.0–46.0)
HEMOGLOBIN: 10.6 g/dL — AB (ref 12.0–15.0)
MCH: 29.9 pg (ref 26.0–34.0)
MCHC: 32.5 g/dL (ref 30.0–36.0)
MCV: 91.8 fL (ref 78.0–100.0)
Platelets: 339 10*3/uL (ref 150–400)
RBC: 3.55 MIL/uL — AB (ref 3.87–5.11)
RDW: 14.2 % (ref 11.5–15.5)
WBC: 18.4 10*3/uL — ABNORMAL HIGH (ref 4.0–10.5)

## 2015-11-17 LAB — BASIC METABOLIC PANEL
Anion gap: 7 (ref 5–15)
BUN: 16 mg/dL (ref 6–20)
CALCIUM: 8.7 mg/dL — AB (ref 8.9–10.3)
CO2: 25 mmol/L (ref 22–32)
Chloride: 105 mmol/L (ref 101–111)
Creatinine, Ser: 1.34 mg/dL — ABNORMAL HIGH (ref 0.44–1.00)
GFR, EST AFRICAN AMERICAN: 47 mL/min — AB (ref >60)
GFR, EST NON AFRICAN AMERICAN: 41 mL/min — AB (ref >60)
GLUCOSE: 146 mg/dL — AB (ref 65–99)
Potassium: 4.3 mmol/L (ref 3.5–5.1)
SODIUM: 137 mmol/L (ref 135–145)

## 2015-11-17 MED ORDER — OXYCODONE HCL 5 MG PO TABS: ORAL_TABLET | ORAL | Status: DC

## 2015-11-17 MED ORDER — ENOXAPARIN SODIUM 30 MG/0.3ML ~~LOC~~ SOLN: 30.0000 mg | SUBCUTANEOUS | Status: DC

## 2015-11-17 NOTE — Anesthesia Postprocedure Evaluation (Signed)
Anesthesia Post Note  Patient: Deanna [name]  Procedure(s) Performed: Procedure(s) (LRB): LEFT TOTAL KNEE ARTHROPLASTY (Left)  Patient location during evaluation: PACU Anesthesia Type: General and Regional Level of consciousness: awake and alert Pain management: pain level controlled Vital Signs Assessment: post-procedure vital signs reviewed and stable Respiratory status: spontaneous breathing, nonlabored ventilation, respiratory function stable and patient connected to nasal cannula oxygen Cardiovascular status: blood pressure returned to baseline and stable Postop Assessment: no signs of nausea or vomiting Anesthetic complications: no    Last Vitals:  Filed Vitals:   11/17/15 0114 11/17/15 0411  BP: 135/57 128/58  Pulse: 64 62  Temp: 36.8 C 36.7 C  Resp: 16 18    Last Pain:  Filed Vitals:   11/17/15 1314  PainSc: 6                  Montez Hageman

## 2015-11-17 NOTE — Evaluation (Signed)
Occupational Therapy Evaluation Patient Details Name: Deanna [name] MRN: [id number] DOB: [birth year] Today's Date: 11/17/2015    History of Present Illness Deanna [name], 65 y.o. 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 Patient was admitted for left total knee arthroplasty.   Clinical Impression   Pt at set - min A level with ADLs, sup with ADL mobility and transfers. Pt has DME and A/E at home from previous ortho surgery. All education completed and no further acute OT indicated at this time/ pt will have 24/7 assist at home fro her husband prn.     Follow Up Recommendations  Supervision - Intermittent    Equipment Recommendations  None recommended by OT    Recommendations for Other Services       Precautions / Restrictions Precautions Precautions: Knee Precaution Comments: No pillow behind L knee, use of 0 degree foam. Restrictions Weight Bearing Restrictions: Yes LLE Weight Bearing: Weight bearing as tolerated      Mobility Bed Mobility               General bed mobility comments: Pt received in recliner on arrival.    Transfers Overall transfer level: Needs assistance Equipment used: Rolling walker (2 wheeled) Transfers: Sit to/from Stand Sit to Stand: Supervision         General transfer comment: Cues for safety and advancement of LLE.  Performed stand to sit with MOD I.      Balance Overall balance assessment: Needs assistance Sitting-balance support: No upper extremity supported;Feet supported Sitting balance-Leahy Scale: Good     Standing balance support: During functional activity Standing balance-Leahy Scale: Fair                              ADL Overall ADL's : Needs assistance/impaired     Grooming: Wash/dry hands;Wash/dry face;Sitting;Supervision/safety;Set up   Upper Body Bathing: Set up;Sitting   Lower Body Bathing: Minimal assistance   Upper Body Dressing : Set up;Sitting   Lower Body Dressing:  Minimal assistance   Toilet Transfer: Supervision/safety;RW;Comfort height toilet;Ambulation   Toileting- Clothing Manipulation and Hygiene: Supervision/safety   Tub/ Banker: Supervision/safety;3 in 1   Functional mobility during ADLs: Supervision/safety       Vision  wears glasses, no change from baseline              Pertinent Vitals/Pain Pain Assessment: 0-10 Pain Score: 5  Pain Location: L knee Pain Descriptors / Indicators: Sore Pain Intervention(s): Monitored during session;Repositioned     Hand Dominance Right   Extremity/Trunk Assessment Upper Extremity Assessment Upper Extremity Assessment: Overall WFL for tasks assessed   Lower Extremity Assessment Lower Extremity Assessment: Defer to PT evaluation   Cervical / Trunk Assessment Cervical / Trunk Assessment: Normal   Communication Communication Communication: No difficulties   Cognition Arousal/Alertness: Awake/alert Behavior During Therapy: WFL for tasks assessed/performed Overall Cognitive Status: Within Functional Limits for tasks assessed                     General Comments   pt very pleasant and cooperative                 Home Living Family/patient expects to be discharged to:: Private residence Living Arrangements: Spouse/significant other Available Help at Discharge: Family Type of Home: House Home Access: Stairs to enter Technical brewer of Steps: 3 Entrance Stairs-Rails: Right Home Layout: One level  Bathroom Shower/Tub: Occupational psychologist: Standard     Home Equipment: Environmental consultant - 2 wheels;Shower seat - built in          Prior Functioning/Environment Level of Independence: Independent             OT Diagnosis: Acute pain   OT Problem List: Pain;Impaired balance (sitting and/or standing);Decreased activity tolerance   OT Treatment/Interventions: Self-care/ADL training;Patient/family education;Therapeutic activities;DME and/or  AE instruction    OT Goals(Current goals can be found in the care plan section) Acute Rehab OT Goals Patient Stated Goal: To go home tomorrow.  OT Frequency: Min 2X/week   Barriers to D/C:    none                     End of Session Equipment Utilized During Treatment: Rolling walker;Other (comment) (3 in1) CPM Left Knee CPM Left Knee: Off  Activity Tolerance: Patient tolerated treatment well Patient left: in chair;with call bell/phone within reach;with family/visitor present   Time: [id number] OT Time Calculation (min): 21 min Charges:  OT General Charges $OT Visit: 1 Procedure OT Evaluation $OT Eval Moderate Complexity: 1 Procedure G-Codes:    Britt Bottom 11/17/2015, 1:18 PM

## 2015-11-17 NOTE — Progress Notes (Signed)
Physical Therapy Treatment Patient Details Name: Deanna [name] MRN: [id number] DOB: 1950-11-26 Today's Date: 11/17/2015    History of Present Illness Deanna [name], 65 y.o. 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 Patient was admitted for left total knee arthroplasty.    PT Comments    Pt performed with decreased assist and increased activity.  PTA reviewed stair training for safe entry into home and reviewed HEP for accuracy and frequency.  Pt ready to d/c home.  Informed nursing that patient requesting pain meds and to bed d/c.    Follow Up Recommendations  Home health PT;Supervision for mobility/OOB     Equipment Recommendations  None recommended by PT (Pt has RW at home.  )    Recommendations for Other Services Rehab consult     Precautions / Restrictions Precautions Precautions: Knee Precaution Comments: No pillow behind L knee, use of 0 degree foam. Restrictions Weight Bearing Restrictions: Yes LLE Weight Bearing: Weight bearing as tolerated    Mobility  Bed Mobility               General bed mobility comments: Pt received in recliner on arrival.    Transfers Overall transfer level: Needs assistance Equipment used: Rolling walker (2 wheeled) Transfers: Sit to/from Stand Sit to Stand: Supervision         General transfer comment: Cues for safety and advancement of LLE.  Performed stand to sit with MOD I.    Ambulation/Gait Ambulation/Gait assistance: Supervision Ambulation Distance (Feet): 250 Feet Assistive device: Rolling walker (2 wheeled) Gait Pattern/deviations: Step-through pattern;Antalgic   Gait velocity interpretation: at or above normal speed for age/gender General Gait Details: Pt instructed to continue to ambulate with step through pattern with emphasis on gait symmetry and upright posture.     Stairs Stairs: Yes   Stair Management: One rail Right;Sideways;Forwards;With cane Number of Stairs: 6 (3x2) General stair  comments: Pt performed sideways pattern first with twisting motion reports PA showed her technique.  PTA instructed to use R rail and cane to maintain forward posture for improved safety and added confidence during pattern/sequencing.  Pt required cues for cane placement.    Wheelchair Mobility    Modified Rankin (Stroke Patients Only)       Balance Overall balance assessment: Needs assistance   Sitting balance-Leahy Scale: Good       Standing balance-Leahy Scale: Fair                      Cognition Arousal/Alertness: Awake/alert Behavior During Therapy: WFL for tasks assessed/performed Overall Cognitive Status: Within Functional Limits for tasks assessed                      Exercises Total Joint Exercises Ankle Circles/Pumps: AROM;Both;10 reps;Supine Quad Sets: AROM;Left;10 reps;Supine Towel Squeeze: AROM;Both;10 reps;Supine Short Arc Quad: AROM;Left;10 reps;Supine Heel Slides: AROM;Left;10 reps;Supine Hip ABduction/ADduction: AROM;Left;10 reps;Supine Straight Leg Raises: AROM;Left;10 reps;Supine Long Arc Quad: AROM;Left;10 reps;Seated Knee Flexion: AROM;AAROM;Seated;Left;10 reps (1x5 AAROM and 1x5 AROM.  ) Goniometric ROM: 0-94 degrees.,    General Comments        Pertinent Vitals/Pain Pain Assessment: 0-10 Pain Score: 8  Pain Location: L knee Pain Descriptors / Indicators: Tightness;Sore Pain Intervention(s): Monitored during session;Repositioned;Ice applied    Home Living Family/patient expects to be discharged to:: Private residence Living Arrangements: Spouse/significant other                  Prior Function  PT Goals (current goals can now be found in the care plan section) Acute Rehab PT Goals Patient Stated Goal: To go home tomorrow. Potential to Achieve Goals: Good Progress towards PT goals: Progressing toward goals    Frequency  7X/week    PT Plan Current plan remains appropriate    Co-evaluation              End of Session Equipment Utilized During Treatment: Gait belt Activity Tolerance: Patient tolerated treatment well Patient left: in chair;with call bell/phone within reach;with chair alarm set     Time: [id number] PT Time Calculation (min) (ACUTE ONLY): 27 min  Charges:  $Gait Training: 8-22 mins $Therapeutic Exercise: 8-22 mins                    G Codes:      Cristela Blue 20-Nov-2015, 1:09 PM Governor Rooks, PTA pager [phone]

## 2015-11-17 NOTE — Care Management Note (Signed)
Case Management Note  Patient Details  Name: Deanna [name] MRN: [id number] Date of Birth: [birth year]  Subjective/Objective:   65 yr old female s/p left total knee arthroplasty.                   Action/Plan: Case manager spoke with patient and husband concerning home health and DME needs. Choice was offered for Home Health, patient requested [facility]. CM called referral to Pietro Cassis, [facility] Liaison., patient requested therapist by the name of Erline Levine. Cm relayed this request also. Patient has Rolling walker, doesn't want 3in1, CPM has been delivered,. She will have family support at discharge.   Expected Discharge Date:    11/17/15              Expected Discharge Plan:  [place]  In-House Referral:     Discharge planning Services  CM Consult  Post Acute Care Choice:  Home Health, Durable Medical Equipment Choice offered to:  Patient  DME Arranged:  [facility] DME Agency:  [employer]  HH Arranged:  PT HH Agency:  [facility]  Status of Service:  Completed, signed off  If discussed at [place] of Stay Meetings, dates discussed:    Additional Comments:  Ninfa Meeker, RN 11/17/2015, 10:44 AM

## 2015-11-17 NOTE — Discharge Summary (Signed)
Patient ID: Deanna [name] MRN: [id number] DOB/AGE: 65-30-1952 65 y.o.  Admit date: 11/16/2015 Discharge date: 11/17/2015  Admission Diagnoses:  Principal Problem:   Primary localized osteoarthritis of left knee Active Problems:   HTN (hypertension)   Obesity   Hx of pancreatitis   Kidney disease, chronic, stage III (GFR 30-59 ml/min)   Discharge Diagnoses:  Same  Past Medical History  Diagnosis Date  . HTN (hypertension)   . Reflux   . GERD (gastroesophageal reflux disease)   . Hx of pancreatitis 2015    attributes to Etodolac and Zpack  . Primary localized osteoarthritis of right knee   . History of hiatal hernia   . Chronic kidney disease     pt. reports that during episode of pancreatitis, she experienced decreased kidney function related to contrast dye   . Kidney disease, chronic, stage III (GFR 30-59 ml/min) 05/19/2015  . Primary localized osteoarthritis of left knee     Surgeries: Procedure(s): LEFT TOTAL KNEE ARTHROPLASTY on 11/16/2015   Consultants:    Discharged Condition: Improved  Hospital Course: Deanna [name] is an 65 y.o. female who was admitted 11/16/2015 for operative treatment ofPrimary localized osteoarthritis of left knee. Patient has severe unremitting pain that affects sleep, daily activities, and work/hobbies. After pre-op clearance the patient was taken to the operating room on 11/16/2015 and underwent  Procedure(s): LEFT TOTAL KNEE ARTHROPLASTY.    Patient was given perioperative antibiotics: Anti-infectives    Start     Dose/Rate Route Frequency Ordered Stop   11/16/15 1400  ceFAZolin (ANCEF) IVPB 2g/100 mL premix     2 g 200 mL/hr over 30 Minutes Intravenous Every 6 hours 11/16/15 1050 11/16/15 2106   11/16/15 0829  cefUROXime (ZINACEF) injection  Status:  Discontinued       As needed 11/16/15 0829 11/16/15 0927   11/16/15 0700  ceFAZolin (ANCEF) IVPB 2g/100 mL premix     2 g 200 mL/hr over 30 Minutes Intravenous To [facility]  11/13/15 1444 11/16/15 0725       Patient was given sequential compression devices, early ambulation, and chemoprophylaxis to prevent DVT.  Patient benefited maximally from hospital stay and there were no complications.    Recent vital signs: Patient Vitals for the past 24 hrs:  BP Temp Temp src Pulse Resp SpO2  11/17/15 0411 (!) 128/58 mmHg 98.1 F (36.7 C) Oral 62 18 99 %  11/17/15 0114 (!) 135/57 mmHg 98.2 F (36.8 C) Oral 64 16 99 %  11/16/15 2126 136/62 mmHg 97.6 F (36.4 C) Oral 64 16 98 %  11/16/15 1057 (!) 141/57 mmHg 98.7 F (37.1 C) Oral 71 17 100 %  11/16/15 1030 (!) 132/54 mmHg 97.5 F (36.4 C) - 65 14 97 %  11/16/15 1015 (!) 132/50 mmHg - - 64 10 100 %  11/16/15 1000 (!) 128/56 mmHg - - 60 12 100 %     Recent laboratory studies:  Recent Labs  11/17/15 0406  WBC 18.4*  HGB 10.6*  HCT 32.6*  PLT 339  NA 137  K 4.3  CL 105  CO2 25  BUN 16  CREATININE 1.34*  GLUCOSE 146*  CALCIUM 8.7*     Discharge Medications:     Medication List    STOP taking these medications        CO Q 10 PO     MEGARED OMEGA-3 KRILL OIL PO     multivitamin tablet     vitamin B-12 1000 MCG tablet  Commonly  known as:  CYANOCOBALAMIN      TAKE these medications        acetaminophen 500 MG tablet  Commonly known as:  TYLENOL  Take 500 mg by mouth every 6 (six) hours as needed.     amLODipine 10 MG tablet  Commonly known as:  NORVASC  Take 10 mg by mouth daily.     atorvastatin 20 MG tablet  Commonly known as:  LIPITOR  Take 20 mg by mouth daily after breakfast.     docusate sodium 100 MG capsule  Commonly known as:  COLACE  1 tab 2 times a day while on narcotics.  STOOL SOFTENER     enoxaparin 30 MG/0.3ML injection  Commonly known as:  LOVENOX  Inject 0.3 mLs (30 mg total) into the skin daily.     magnesium oxide 400 MG tablet  Commonly known as:  MAG-OX  Take 400 mg by mouth 2 (two) times daily. Pt. Takes Magnesium citrate 100mg . Oral pill - daily      Melatonin 2.5 MG Caps  Take 1 capsule by mouth at bedtime.     oxyCODONE 5 MG immediate release tablet  Commonly known as:  Oxy IR/ROXICODONE  1-2 tab po q 3 hrs prn pain     polyethylene glycol packet  Commonly known as:  MIRALAX / GLYCOLAX  17grams in 16 oz of water twice a day until bowel movement.  LAXITIVE.  Restart if two days since last bowel movement     ranitidine 150 MG tablet  Commonly known as:  ZANTAC  Take 150 mg by mouth 4 (four) times daily -  with meals and at bedtime.     saccharomyces boulardii 250 MG capsule  Commonly known as:  FLORASTOR  Take 250 mg by mouth 2 (two) times daily.        Diagnostic Studies: Mm Screening Breast Tomo Bilateral  11/02/2015  CLINICAL DATA:  Screening. EXAM: 2D DIGITAL SCREENING BILATERAL MAMMOGRAM WITH CAD AND ADJUNCT TOMO COMPARISON:  Previous exam(s). ACR Breast Density Category b: There are scattered areas of fibroglandular density. FINDINGS: There are no findings suspicious for malignancy. Images were processed with CAD. IMPRESSION: No mammographic evidence of malignancy. A result letter of this screening mammogram will be mailed directly to the patient. RECOMMENDATION: Screening mammogram in one year. (Code:SM-B-01Y) BI-RADS CATEGORY  1: Negative. Electronically Signed   By: Ammie Ferrier M.D.   On: 11/02/2015 08:14    Disposition: 06-Home-Health Care Svc      Discharge Instructions    CPM    Complete by:  As directed   Continuous passive motion machine (CPM):      Use the CPM from 0 to 90 for 6 hours per day.       You may break it up into 2 or 3 sessions per day.      Use CPM for 2 weeks or until you are told to stop.     Call MD / Call 911    Complete by:  As directed   If you experience chest pain or shortness of breath, CALL 911 and be transported to the hospital emergency room.  If you develope a fever above 101 F, pus (white drainage) or increased drainage or redness at the wound, or calf pain, call your surgeon's  office.     Change dressing    Complete by:  As directed   Change the gauze dressing daily with sterile 4 x 4 inch gauze and apply TED hose.  DO NOT REMOVE BANDAGE OVER SURGICAL INCISION.  [place] WHOLE LEG INCLUDING OVER THE WATERPROOF BANDAGE WITH SOAP AND WATER EVERY DAY.     Constipation Prevention    Complete by:  As directed   Drink plenty of fluids.  Prune juice may be helpful.  You may use a stool softener, such as Colace (over the counter) 100 mg twice a day.  Use MiraLax (over the counter) for constipation as needed.     Diet - low sodium heart healthy    Complete by:  As directed      Discharge instructions    Complete by:  As directed   INSTRUCTIONS AFTER JOINT REPLACEMENT   Remove items at home which could result in a fall. This includes throw rugs or furniture in walking pathways ICE to the affected joint every three hours while awake for 30 minutes at a time, for at least the first 3-5 days, and then as needed for pain and swelling.  Continue to use ice for pain and swelling. You may notice swelling that will progress down to the foot and ankle.  This is normal after surgery.  Elevate your leg when you are not up walking on it.   Continue to use the breathing machine you got in the hospital (incentive spirometer) which will help keep your temperature down.  It is common for your temperature to cycle up and down following surgery, especially at night when you are not up moving around and exerting yourself.  The breathing machine keeps your lungs expanded and your temperature down.   DIET:  As you were doing prior to hospitalization, we recommend a well-balanced diet.  DRESSING / WOUND CARE / SHOWERING  Keep the surgical dressing until follow up.  The dressing is water proof, so you can shower without any extra covering.  IF THE DRESSING FALLS OFF or the wound gets wet inside, change the dressing with sterile gauze.  Please use good hand washing techniques before changing the  dressing.  Do not use any lotions or creams on the incision until instructed by your surgeon.    ACTIVITY  Increase activity slowly as tolerated, but follow the weight bearing instructions below.   No driving for 6 weeks or until further direction given by your physician.  You cannot drive while taking narcotics.  No lifting or carrying greater than 10 lbs. until further directed by your surgeon. Avoid periods of inactivity such as sitting longer than an hour when not asleep. This helps prevent blood clots.  You may return to work once you are authorized by your doctor.     WEIGHT BEARING   Weight bearing as tolerated with assist device (walker, cane, etc) as directed, use it as long as suggested by your surgeon or therapist, typically at least 2-3 weeks.   EXERCISES  Results after joint replacement surgery are often greatly improved when you follow the exercise, range of motion and muscle strengthening exercises prescribed by your doctor. Safety measures are also important to protect the joint from further injury. Any time any of these exercises cause you to have increased pain or swelling, decrease what you are doing until you are comfortable again and then slowly increase them. If you have problems or questions, call your caregiver or physical therapist for advice.   Rehabilitation is important following a joint replacement. After just a few days of immobilization, the muscles of the leg can become weakened and shrink (atrophy).  These exercises are designed to build up  the tone and strength of the thigh and leg muscles and to improve motion. Often times heat used for twenty to thirty minutes before working out will loosen up your tissues and help with improving the range of motion but do not use heat for the first two weeks following surgery (sometimes heat can increase post-operative swelling).   These exercises can be done on a training (exercise) mat, on the floor, on a table or on a  bed. Use whatever works the best and is most comfortable for you.    Use music or television while you are exercising so that the exercises are a pleasant break in your day. This will make your life better with the exercises acting as a break in your routine that you can look forward to.   Perform all exercises about fifteen times, three times per day or as directed.  You should exercise both the operative leg and the other leg as well.   Exercises include:  Quad Sets - Tighten up the muscle on the front of the thigh (Quad) and hold for 5-10 seconds.   Straight Leg Raises - With your knee straight (if you were given a brace, keep it on), lift the leg to 60 degrees, hold for 3 seconds, and slowly lower the leg.  Perform this exercise against resistance later as your leg gets stronger.  Leg Slides: Lying on your back, slowly slide your foot toward your buttocks, bending your knee up off the floor (only go as far as is comfortable). Then slowly slide your foot back down until your leg is flat on the floor again.  Angel Wings: Lying on your back spread your legs to the side as far apart as you can without causing discomfort.  Hamstring Strength:  Lying on your back, push your heel against the floor with your leg straight by tightening up the muscles of your buttocks.  Repeat, but this time bend your knee to a comfortable angle, and push your heel against the floor.  You may put a pillow under the heel to make it more comfortable if necessary.   A rehabilitation program following joint replacement surgery can speed recovery and prevent re-injury in the future due to weakened muscles. Contact your doctor or a physical therapist for more information on knee rehabilitation.    CONSTIPATION  Constipation is defined medically as fewer than three stools per week and severe constipation as less than one stool per week.  Even if you have a regular bowel pattern at home, your normal regimen is likely to be  disrupted due to multiple reasons following surgery.  Combination of anesthesia, postoperative narcotics, change in appetite and fluid intake all can affect your bowels.   YOU MUST use at least one of the following options; they are listed in order of increasing strength to get the job done.  They are all available over the counter, and you may need to use some, POSSIBLY even all of these options:    Drink plenty of fluids (prune juice may be helpful) and high fiber foods Colace 100 mg by mouth twice a day  Senokot for constipation as directed and as needed Dulcolax (bisacodyl), take with full glass of water  Miralax (polyethylene glycol) once or twice a day as needed.  If you have tried all these things and are unable to have a bowel movement in the first 3-4 days after surgery call either your surgeon or your primary doctor.    If you experience  loose stools or diarrhea, hold the medications until you stool forms back up.  If your symptoms do not get better within 1 week or if they get worse, check with your doctor.  If you experience "the worst abdominal pain ever" or develop nausea or vomiting, please contact the office immediately for further recommendations for treatment.   ITCHING:  If you experience itching with your medications, try taking only a single pain pill, or even half a pain pill at a time.  You can also use Benadryl over the counter for itching or also to help with sleep.   TED HOSE STOCKINGS:  Use stockings on both legs until for at least 2 weeks or as directed by physician office. They may be removed at night for sleeping.  MEDICATIONS:  See your medication summary on the "After Visit Summary" that nursing will review with you.  You may have some home medications which will be placed on hold until you complete the course of blood thinner medication.  It is important for you to complete the blood thinner medication as prescribed.  PRECAUTIONS:  If you experience chest pain or  shortness of breath - call 911 immediately for transfer to the hospital emergency department.   If you develop a fever greater that 101 F, purulent drainage from wound, increased redness or drainage from wound, foul odor from the wound/dressing, or calf pain - CONTACT YOUR SURGEON.                                                   FOLLOW-UP APPOINTMENTS:  If you do not already have a post-op appointment, please call the office for an appointment to be seen by your surgeon.  Guidelines for how soon to be seen are listed in your "After Visit Summary", but are typically between 1-4 weeks after surgery.  OTHER INSTRUCTIONS:   Knee Replacement:  Do not place pillow under knee, focus on keeping the knee straight while resting. CPM instructions: 0-90 degrees, 2 hours in the morning, 2 hours in the afternoon, and 2 hours in the evening. Place foam block, curve side up under heel at all times except when in CPM or when walking.  DO NOT modify, tear, cut, or change the foam block in any way.  MAKE SURE YOU:  Understand these instructions.  Get help right away if you are not doing well or get worse.    Thank you for letting us be a part of your medical care team.  It is a privilege we respect greatly.  We hope these instructions will help you stay on track for a fast and full recovery!     Do not put a pillow under the knee. Place it under the heel.    Complete by:  As directed   Place gray foam block, curve side up under heel at all times except when in CPM or when walking.  DO NOT modify, tear, cut, or change in any way the gray foam block.     Increase activity slowly as tolerated    Complete by:  As directed      Patient may shower    Complete by:  As directed   Aquacel dressing is water proof    Wash over it and the whole leg with soap and water at the end of your shower  TED hose    Complete by:  As directed   Use stockings (TED hose) for 2 weeks on both leg(s).  You may remove them at night  for sleeping.           Follow-up Information    Follow up with Lorn Junes, MD On 12/01/2015.   Specialty:  Orthopedic Surgery   Why:  appt time 3:30 pm   Contact information:   [address] [place] [address] [phone]        Signed: Linda Hedges 11/17/2015, 9:56 AM

## 2015-12-01 DIAGNOSIS — M1712 Unilateral primary osteoarthritis, left knee: Secondary | ICD-10-CM | POA: Diagnosis not present

## 2015-12-02 DIAGNOSIS — R2689 Other abnormalities of gait and mobility: Secondary | ICD-10-CM | POA: Diagnosis not present

## 2015-12-02 DIAGNOSIS — M6281 Muscle weakness (generalized): Secondary | ICD-10-CM | POA: Diagnosis not present

## 2015-12-02 DIAGNOSIS — Z96652 Presence of left artificial knee joint: Secondary | ICD-10-CM | POA: Diagnosis not present

## 2015-12-02 DIAGNOSIS — Z471 Aftercare following joint replacement surgery: Secondary | ICD-10-CM | POA: Diagnosis not present

## 2015-12-04 DIAGNOSIS — Z96652 Presence of left artificial knee joint: Secondary | ICD-10-CM | POA: Diagnosis not present

## 2015-12-04 DIAGNOSIS — Z471 Aftercare following joint replacement surgery: Secondary | ICD-10-CM | POA: Diagnosis not present

## 2015-12-04 DIAGNOSIS — M6281 Muscle weakness (generalized): Secondary | ICD-10-CM | POA: Diagnosis not present

## 2015-12-04 DIAGNOSIS — R2689 Other abnormalities of gait and mobility: Secondary | ICD-10-CM | POA: Diagnosis not present

## 2015-12-07 DIAGNOSIS — Z96652 Presence of left artificial knee joint: Secondary | ICD-10-CM | POA: Diagnosis not present

## 2015-12-07 DIAGNOSIS — M6281 Muscle weakness (generalized): Secondary | ICD-10-CM | POA: Diagnosis not present

## 2015-12-07 DIAGNOSIS — R2689 Other abnormalities of gait and mobility: Secondary | ICD-10-CM | POA: Diagnosis not present

## 2015-12-07 DIAGNOSIS — Z471 Aftercare following joint replacement surgery: Secondary | ICD-10-CM | POA: Diagnosis not present

## 2015-12-09 DIAGNOSIS — Z96652 Presence of left artificial knee joint: Secondary | ICD-10-CM | POA: Diagnosis not present

## 2015-12-09 DIAGNOSIS — M6281 Muscle weakness (generalized): Secondary | ICD-10-CM | POA: Diagnosis not present

## 2015-12-09 DIAGNOSIS — Z471 Aftercare following joint replacement surgery: Secondary | ICD-10-CM | POA: Diagnosis not present

## 2015-12-09 DIAGNOSIS — R2689 Other abnormalities of gait and mobility: Secondary | ICD-10-CM | POA: Diagnosis not present

## 2015-12-14 DIAGNOSIS — M6281 Muscle weakness (generalized): Secondary | ICD-10-CM | POA: Diagnosis not present

## 2015-12-14 DIAGNOSIS — R2689 Other abnormalities of gait and mobility: Secondary | ICD-10-CM | POA: Diagnosis not present

## 2015-12-14 DIAGNOSIS — Z96652 Presence of left artificial knee joint: Secondary | ICD-10-CM | POA: Diagnosis not present

## 2015-12-14 DIAGNOSIS — Z471 Aftercare following joint replacement surgery: Secondary | ICD-10-CM | POA: Diagnosis not present

## 2015-12-16 DIAGNOSIS — Z471 Aftercare following joint replacement surgery: Secondary | ICD-10-CM | POA: Diagnosis not present

## 2015-12-16 DIAGNOSIS — R2689 Other abnormalities of gait and mobility: Secondary | ICD-10-CM | POA: Diagnosis not present

## 2015-12-16 DIAGNOSIS — M6281 Muscle weakness (generalized): Secondary | ICD-10-CM | POA: Diagnosis not present

## 2015-12-16 DIAGNOSIS — Z96652 Presence of left artificial knee joint: Secondary | ICD-10-CM | POA: Diagnosis not present

## 2015-12-21 DIAGNOSIS — R2689 Other abnormalities of gait and mobility: Secondary | ICD-10-CM | POA: Diagnosis not present

## 2015-12-21 DIAGNOSIS — Z471 Aftercare following joint replacement surgery: Secondary | ICD-10-CM | POA: Diagnosis not present

## 2015-12-21 DIAGNOSIS — Z96652 Presence of left artificial knee joint: Secondary | ICD-10-CM | POA: Diagnosis not present

## 2015-12-21 DIAGNOSIS — M6281 Muscle weakness (generalized): Secondary | ICD-10-CM | POA: Diagnosis not present

## 2015-12-23 DIAGNOSIS — Z96652 Presence of left artificial knee joint: Secondary | ICD-10-CM | POA: Diagnosis not present

## 2015-12-23 DIAGNOSIS — R2689 Other abnormalities of gait and mobility: Secondary | ICD-10-CM | POA: Diagnosis not present

## 2015-12-23 DIAGNOSIS — Z471 Aftercare following joint replacement surgery: Secondary | ICD-10-CM | POA: Diagnosis not present

## 2015-12-23 DIAGNOSIS — M6281 Muscle weakness (generalized): Secondary | ICD-10-CM | POA: Diagnosis not present

## 2015-12-25 DIAGNOSIS — I10 Essential (primary) hypertension: Secondary | ICD-10-CM | POA: Diagnosis not present

## 2015-12-25 DIAGNOSIS — E785 Hyperlipidemia, unspecified: Secondary | ICD-10-CM | POA: Diagnosis not present

## 2015-12-25 DIAGNOSIS — K210 Gastro-esophageal reflux disease with esophagitis: Secondary | ICD-10-CM | POA: Diagnosis not present

## 2015-12-25 DIAGNOSIS — M1990 Unspecified osteoarthritis, unspecified site: Secondary | ICD-10-CM | POA: Diagnosis not present

## 2015-12-28 DIAGNOSIS — M6281 Muscle weakness (generalized): Secondary | ICD-10-CM | POA: Diagnosis not present

## 2015-12-28 DIAGNOSIS — Z471 Aftercare following joint replacement surgery: Secondary | ICD-10-CM | POA: Diagnosis not present

## 2015-12-28 DIAGNOSIS — Z96652 Presence of left artificial knee joint: Secondary | ICD-10-CM | POA: Diagnosis not present

## 2015-12-28 DIAGNOSIS — R2689 Other abnormalities of gait and mobility: Secondary | ICD-10-CM | POA: Diagnosis not present

## 2015-12-29 DIAGNOSIS — M170 Bilateral primary osteoarthritis of knee: Secondary | ICD-10-CM | POA: Diagnosis not present

## 2015-12-29 DIAGNOSIS — E785 Hyperlipidemia, unspecified: Secondary | ICD-10-CM | POA: Diagnosis not present

## 2015-12-29 DIAGNOSIS — M1990 Unspecified osteoarthritis, unspecified site: Secondary | ICD-10-CM | POA: Diagnosis not present

## 2015-12-29 DIAGNOSIS — I129 Hypertensive chronic kidney disease with stage 1 through stage 4 chronic kidney disease, or unspecified chronic kidney disease: Secondary | ICD-10-CM | POA: Diagnosis not present

## 2015-12-29 DIAGNOSIS — I10 Essential (primary) hypertension: Secondary | ICD-10-CM | POA: Diagnosis not present

## 2015-12-30 DIAGNOSIS — Z471 Aftercare following joint replacement surgery: Secondary | ICD-10-CM | POA: Diagnosis not present

## 2015-12-30 DIAGNOSIS — M6281 Muscle weakness (generalized): Secondary | ICD-10-CM | POA: Diagnosis not present

## 2015-12-30 DIAGNOSIS — Z96652 Presence of left artificial knee joint: Secondary | ICD-10-CM | POA: Diagnosis not present

## 2015-12-30 DIAGNOSIS — R2689 Other abnormalities of gait and mobility: Secondary | ICD-10-CM | POA: Diagnosis not present

## 2016-01-06 DIAGNOSIS — R2689 Other abnormalities of gait and mobility: Secondary | ICD-10-CM | POA: Diagnosis not present

## 2016-01-06 DIAGNOSIS — Z471 Aftercare following joint replacement surgery: Secondary | ICD-10-CM | POA: Diagnosis not present

## 2016-01-06 DIAGNOSIS — Z96652 Presence of left artificial knee joint: Secondary | ICD-10-CM | POA: Diagnosis not present

## 2016-01-06 DIAGNOSIS — M6281 Muscle weakness (generalized): Secondary | ICD-10-CM | POA: Diagnosis not present

## 2016-01-08 DIAGNOSIS — M6281 Muscle weakness (generalized): Secondary | ICD-10-CM | POA: Diagnosis not present

## 2016-01-08 DIAGNOSIS — Z96652 Presence of left artificial knee joint: Secondary | ICD-10-CM | POA: Diagnosis not present

## 2016-01-08 DIAGNOSIS — Z471 Aftercare following joint replacement surgery: Secondary | ICD-10-CM | POA: Diagnosis not present

## 2016-01-08 DIAGNOSIS — R2689 Other abnormalities of gait and mobility: Secondary | ICD-10-CM | POA: Diagnosis not present

## 2016-01-11 DIAGNOSIS — Z471 Aftercare following joint replacement surgery: Secondary | ICD-10-CM | POA: Diagnosis not present

## 2016-01-11 DIAGNOSIS — Z96652 Presence of left artificial knee joint: Secondary | ICD-10-CM | POA: Diagnosis not present

## 2016-01-11 DIAGNOSIS — R2689 Other abnormalities of gait and mobility: Secondary | ICD-10-CM | POA: Diagnosis not present

## 2016-01-11 DIAGNOSIS — M6281 Muscle weakness (generalized): Secondary | ICD-10-CM | POA: Diagnosis not present

## 2016-01-14 DIAGNOSIS — M6281 Muscle weakness (generalized): Secondary | ICD-10-CM | POA: Diagnosis not present

## 2016-01-14 DIAGNOSIS — Z471 Aftercare following joint replacement surgery: Secondary | ICD-10-CM | POA: Diagnosis not present

## 2016-01-14 DIAGNOSIS — Z96652 Presence of left artificial knee joint: Secondary | ICD-10-CM | POA: Diagnosis not present

## 2016-01-14 DIAGNOSIS — R2689 Other abnormalities of gait and mobility: Secondary | ICD-10-CM | POA: Diagnosis not present

## 2016-01-19 DIAGNOSIS — N2581 Secondary hyperparathyroidism of renal origin: Secondary | ICD-10-CM | POA: Diagnosis not present

## 2016-01-19 DIAGNOSIS — N183 Chronic kidney disease, stage 3 (moderate): Secondary | ICD-10-CM | POA: Diagnosis not present

## 2016-01-19 DIAGNOSIS — E669 Obesity, unspecified: Secondary | ICD-10-CM | POA: Diagnosis not present

## 2016-01-21 ENCOUNTER — Other Ambulatory Visit: Payer: Self-pay | Admitting: Nephrology

## 2016-01-21 DIAGNOSIS — N183 Chronic kidney disease, stage 3 unspecified: Secondary | ICD-10-CM

## 2016-01-26 DIAGNOSIS — M170 Bilateral primary osteoarthritis of knee: Secondary | ICD-10-CM | POA: Diagnosis not present

## 2016-02-01 ENCOUNTER — Ambulatory Visit
Admission: RE | Admit: 2016-02-01 | Discharge: 2016-02-01 | Disposition: A | Discharge status: Home / Self Care | Payer: BC Managed Care – PPO | Source: Ambulatory Visit | Attending: Nephrology | Admitting: Nephrology

## 2016-02-01 DIAGNOSIS — N183 Chronic kidney disease, stage 3 unspecified: Secondary | ICD-10-CM

## 2016-03-07 ENCOUNTER — Other Ambulatory Visit: Payer: Self-pay | Admitting: General Surgery

## 2016-03-07 DIAGNOSIS — K8689 Other specified diseases of pancreas: Secondary | ICD-10-CM

## 2016-03-21 DIAGNOSIS — Z23 Encounter for immunization: Secondary | ICD-10-CM | POA: Diagnosis not present

## 2016-03-22 ENCOUNTER — Ambulatory Visit
Admission: RE | Admit: 2016-03-22 | Discharge: 2016-03-22 | Disposition: A | Discharge status: Home / Self Care | Payer: Medicare Other | Source: Ambulatory Visit | Attending: General Surgery | Admitting: General Surgery

## 2016-03-22 DIAGNOSIS — K8689 Other specified diseases of pancreas: Secondary | ICD-10-CM

## 2016-03-28 DIAGNOSIS — K219 Gastro-esophageal reflux disease without esophagitis: Secondary | ICD-10-CM | POA: Diagnosis not present

## 2016-03-28 DIAGNOSIS — K869 Disease of pancreas, unspecified: Secondary | ICD-10-CM | POA: Diagnosis not present

## 2016-04-13 DIAGNOSIS — N183 Chronic kidney disease, stage 3 (moderate): Secondary | ICD-10-CM | POA: Diagnosis not present

## 2016-04-13 DIAGNOSIS — N2581 Secondary hyperparathyroidism of renal origin: Secondary | ICD-10-CM | POA: Diagnosis not present

## 2016-04-13 DIAGNOSIS — E669 Obesity, unspecified: Secondary | ICD-10-CM | POA: Diagnosis not present

## 2016-04-28 IMAGING — DX DG ABDOMEN 2V
2 series · 2 of 2 positions shown · non-contrast
Comparison: 06/25/2013 abdominal CT

CLINICAL DATA: Generalized abdominal pain with cramping.

EXAM:
ABDOMEN - 2 VIEW

[abdomen erect]
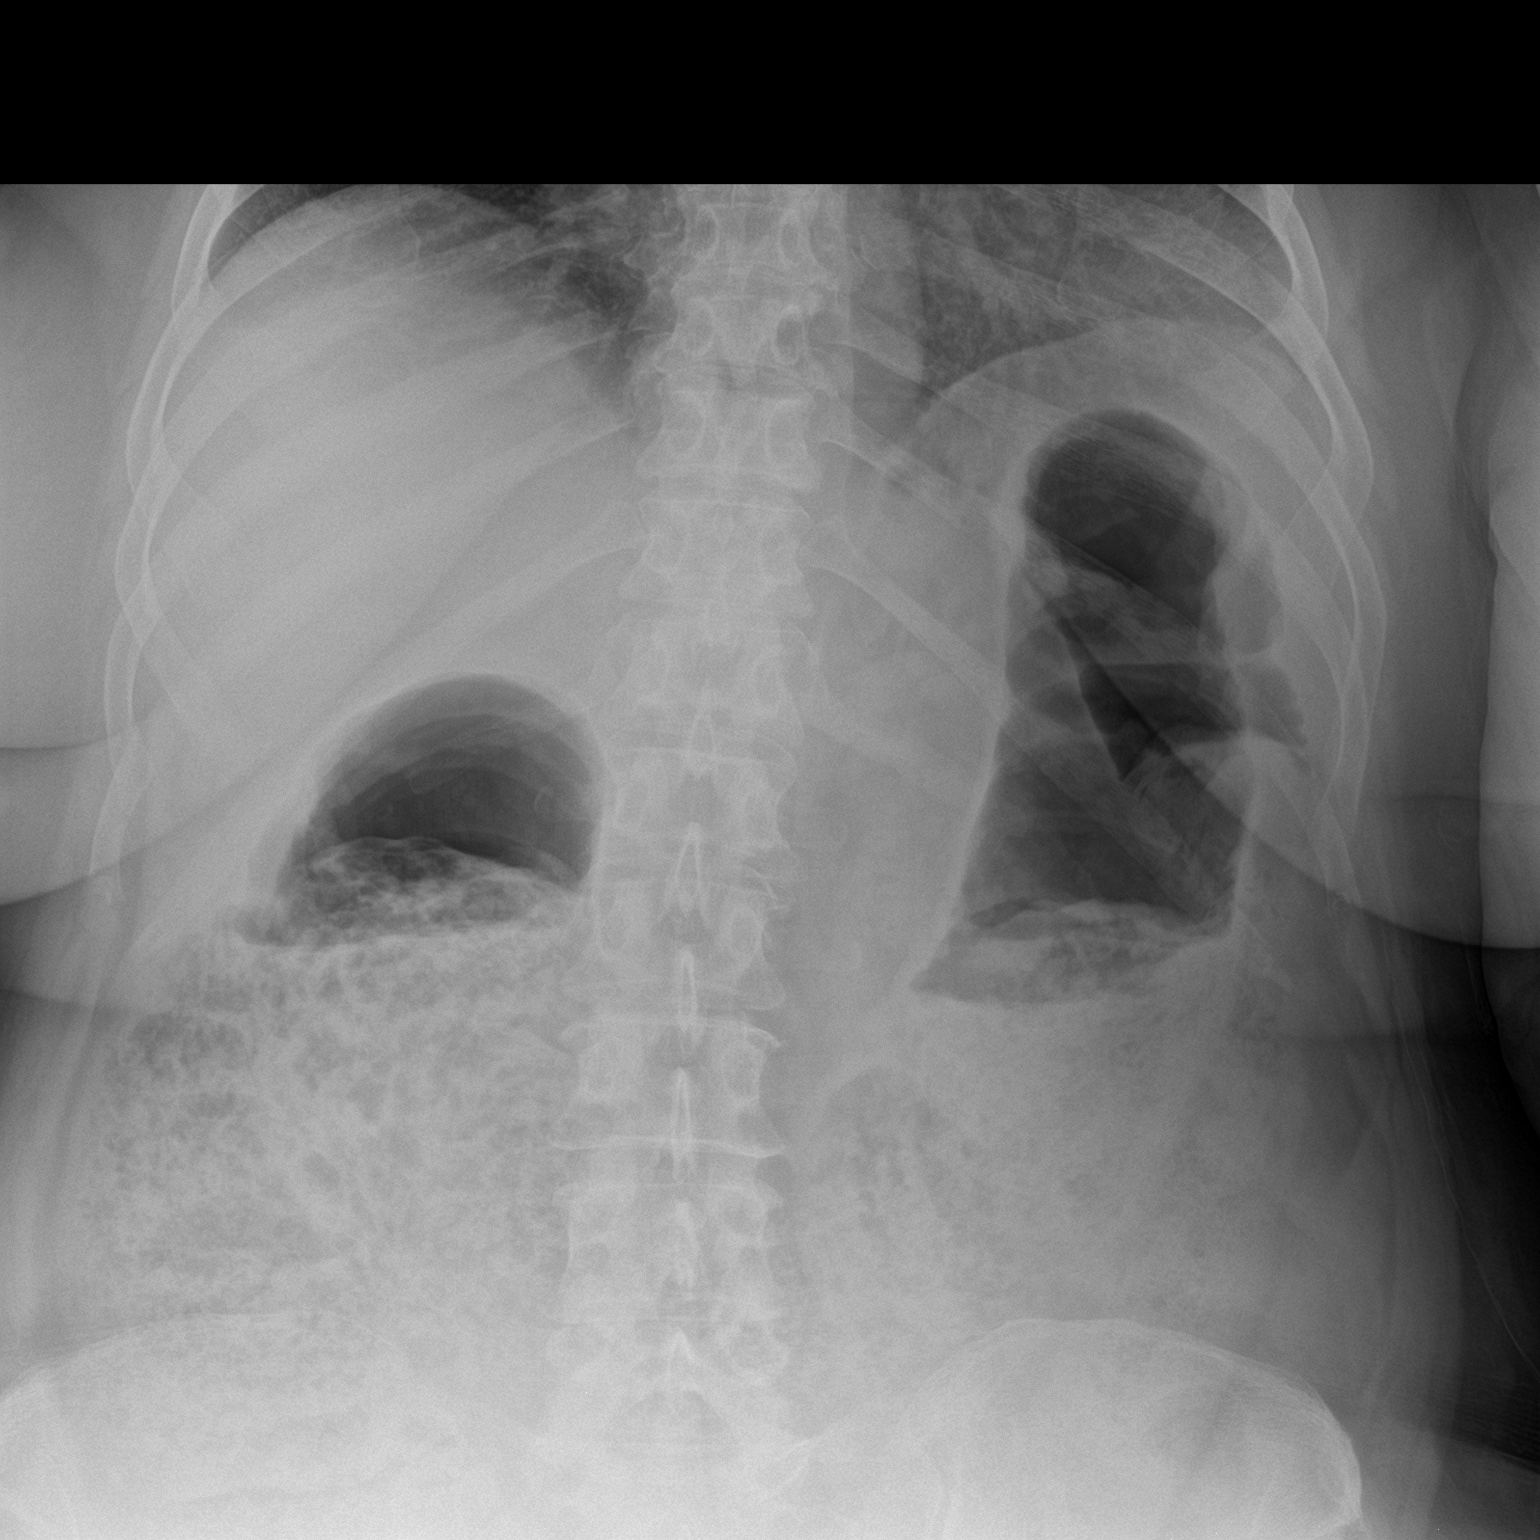

[abdomen supine]
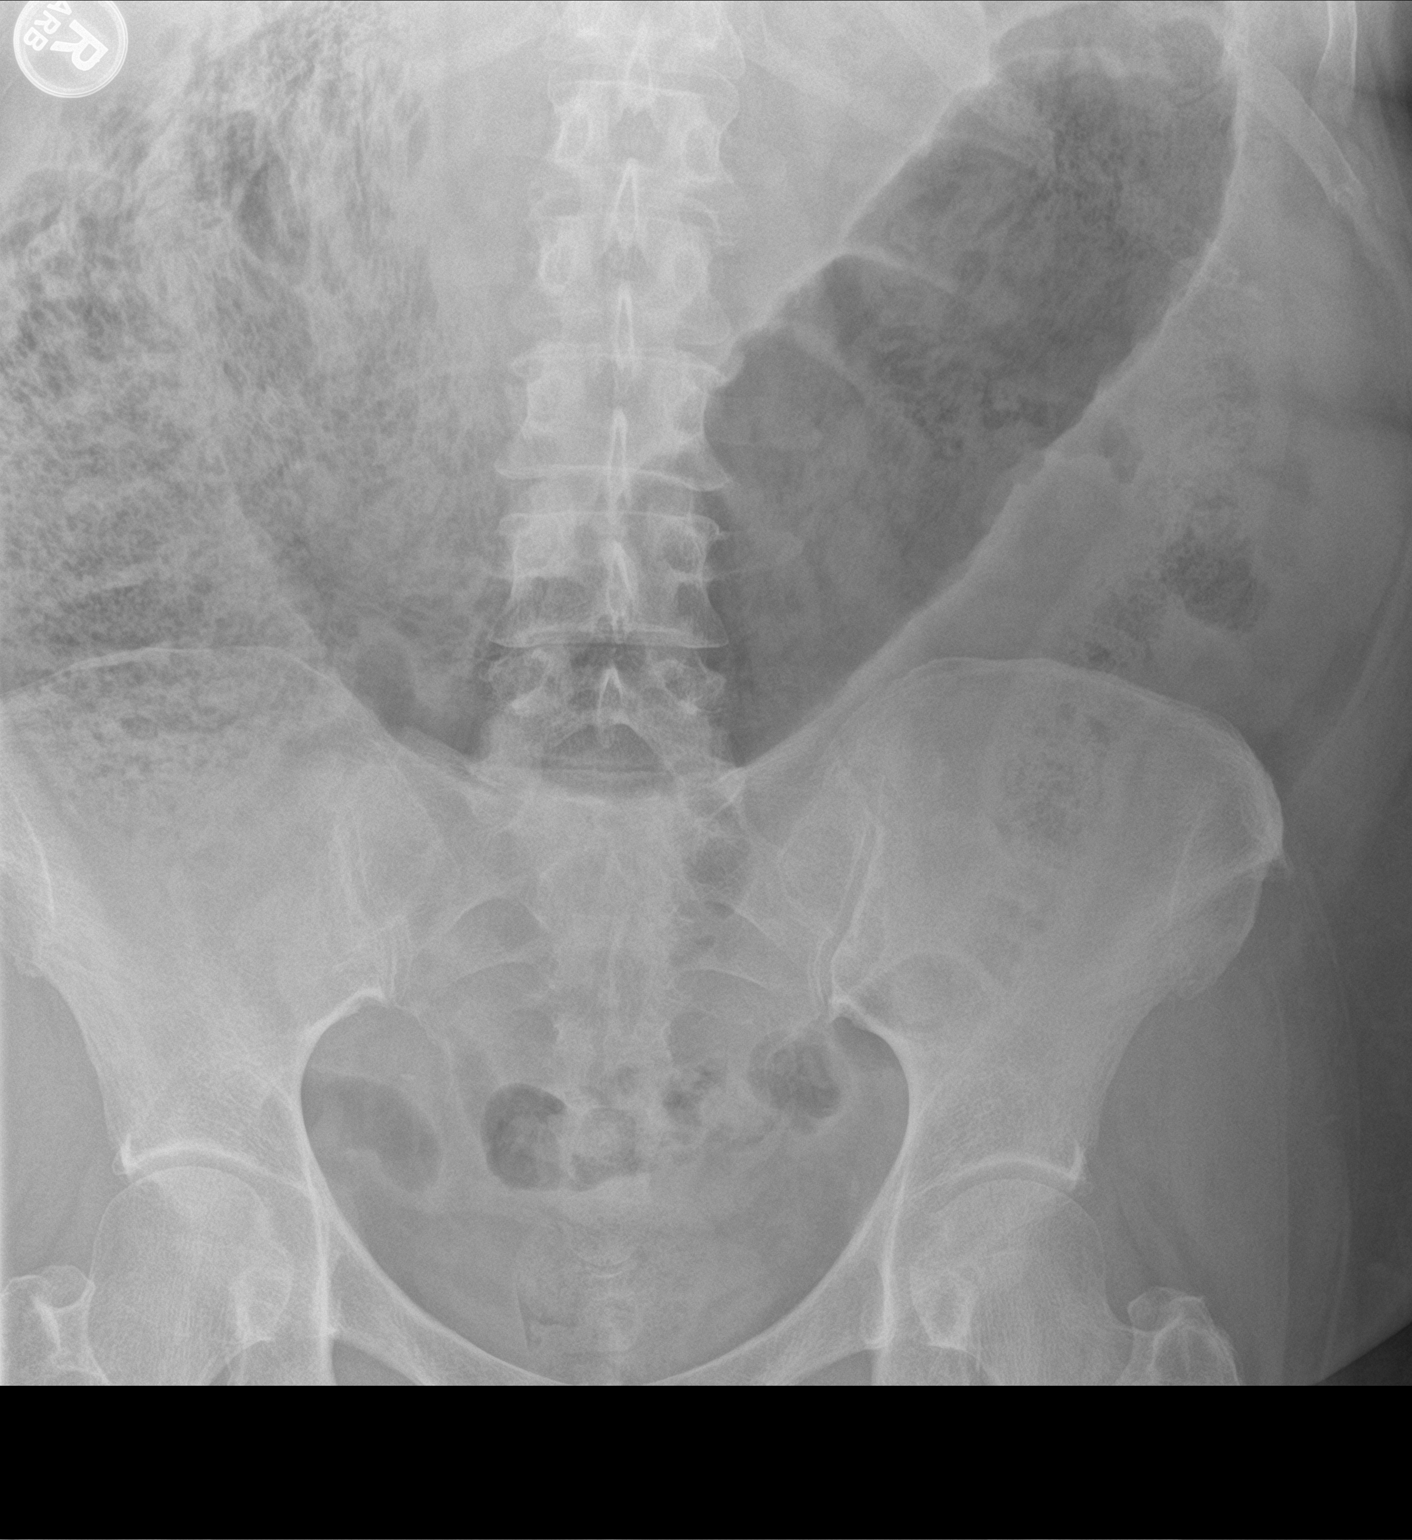

[2 of 2 positions shown; findings below may reference images not displayed]

FINDINGS: Large volume of stool distending the colon. No evidence of small
bowel obstruction. No evidence of pneumoperitoneum. No concerning
intra-abdominal mass effect or calcification. Lung bases are clear.
IMPRESSION: 1. Large volume of stool and gas with proximal colonic distention.
2. No small bowel obstruction.

## 2016-05-24 DIAGNOSIS — M170 Bilateral primary osteoarthritis of knee: Secondary | ICD-10-CM | POA: Diagnosis not present

## 2016-10-17 ENCOUNTER — Other Ambulatory Visit (HOSPITAL_COMMUNITY): Payer: Self-pay | Admitting: Obstetrics and Gynecology

## 2016-10-17 DIAGNOSIS — Z1231 Encounter for screening mammogram for malignant neoplasm of breast: Secondary | ICD-10-CM

## 2016-10-18 DIAGNOSIS — I10 Essential (primary) hypertension: Secondary | ICD-10-CM | POA: Diagnosis not present

## 2016-10-18 DIAGNOSIS — N182 Chronic kidney disease, stage 2 (mild): Secondary | ICD-10-CM | POA: Diagnosis not present

## 2016-10-18 DIAGNOSIS — Z96651 Presence of right artificial knee joint: Secondary | ICD-10-CM | POA: Diagnosis not present

## 2016-10-18 DIAGNOSIS — Z96652 Presence of left artificial knee joint: Secondary | ICD-10-CM | POA: Diagnosis not present

## 2016-10-28 ENCOUNTER — Ambulatory Visit (HOSPITAL_COMMUNITY)
Admission: RE | Admit: 2016-10-28 | Discharge: 2016-10-28 | Disposition: A | Discharge status: Home / Self Care | Payer: Medicare Other | Source: Ambulatory Visit | Attending: Obstetrics and Gynecology | Admitting: Obstetrics and Gynecology

## 2016-10-28 DIAGNOSIS — Z1231 Encounter for screening mammogram for malignant neoplasm of breast: Secondary | ICD-10-CM | POA: Diagnosis not present

## 2016-11-22 DIAGNOSIS — M170 Bilateral primary osteoarthritis of knee: Secondary | ICD-10-CM | POA: Diagnosis not present

## 2016-12-18 IMAGING — DX DG ABDOMEN 1V
1 series · 1 of 1 positions shown · non-contrast
Comparison: 09/28/2014

CLINICAL DATA: Leukocytosis.  Postop right knee replacement.

EXAM:
ABDOMEN - 1 VIEW

[abdomen kub]
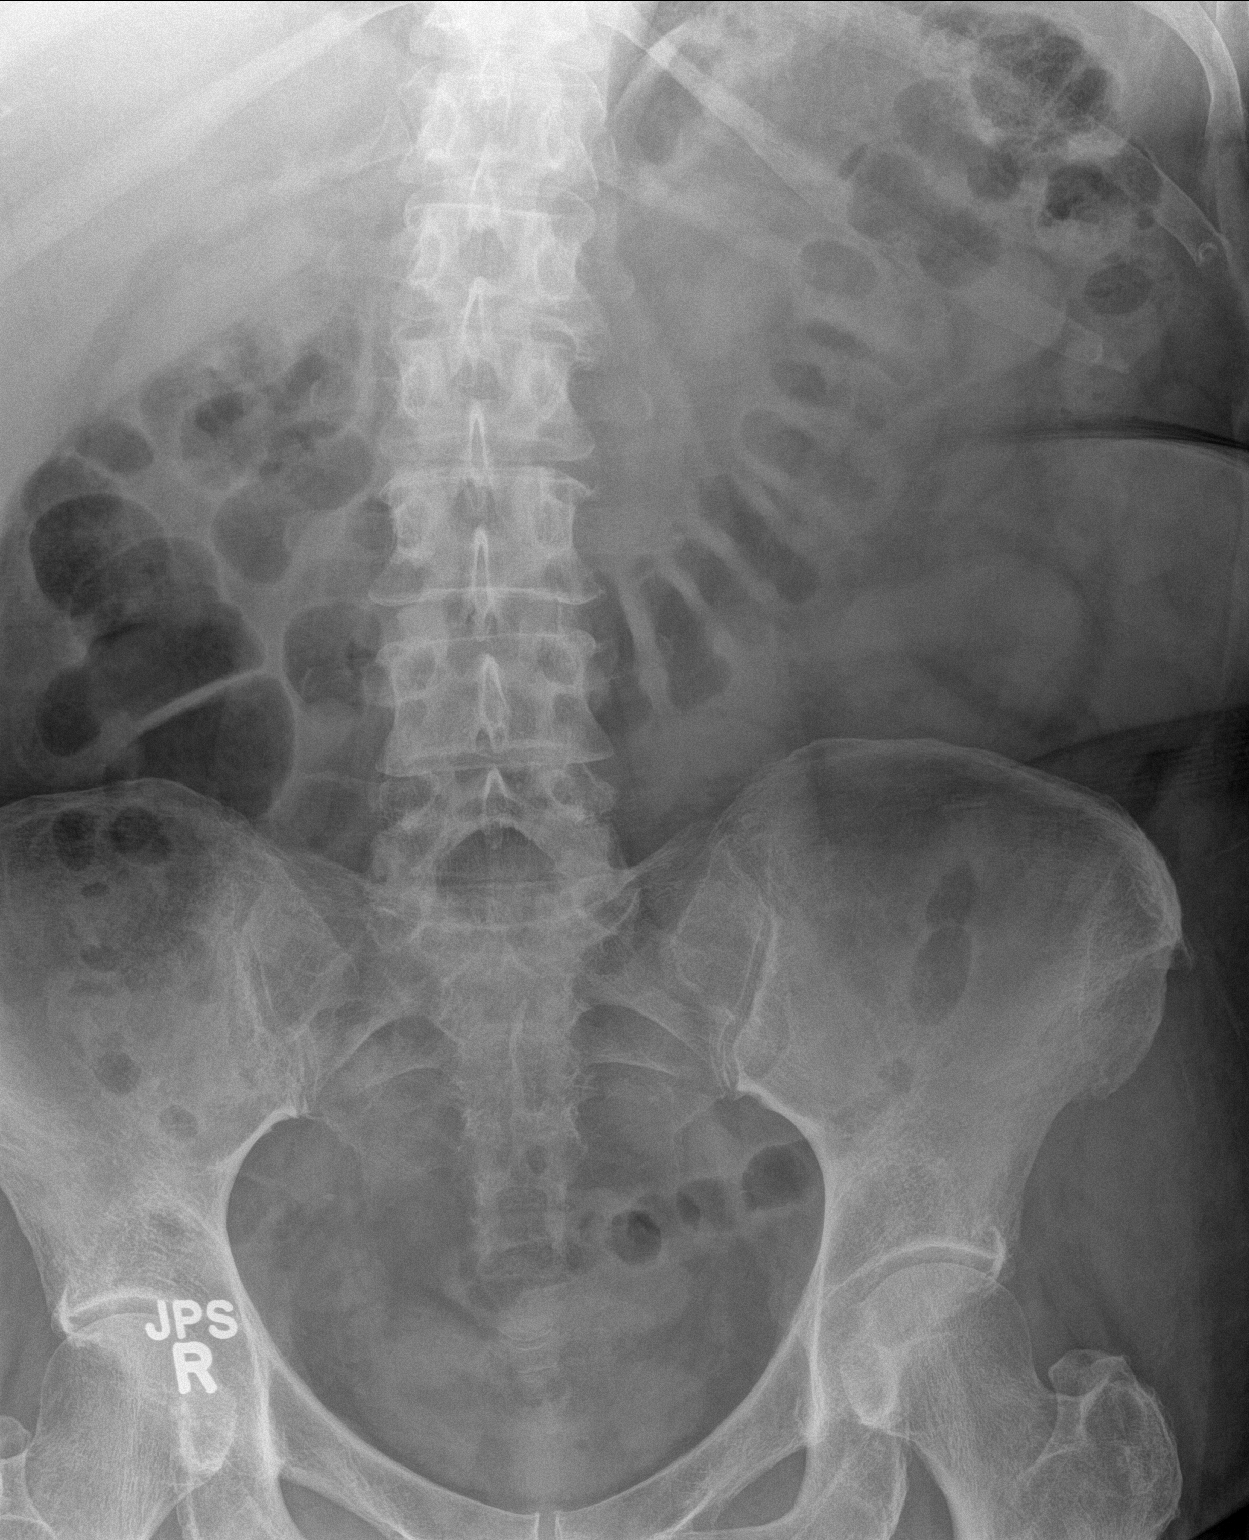

[1 of 1 positions shown; findings below may reference images not displayed]

FINDINGS: Nonobstructive bowel gas pattern. No free air, organomegaly or
suspicious calcification. No acute bony abnormality.
IMPRESSION: No acute findings.

## 2016-12-18 IMAGING — DX DG CHEST 2V
2 series · 2 of 2 positions shown · non-contrast
Comparison: November 11, 2011

CLINICAL DATA: Leukocytosis.

EXAM:
CHEST  2 VIEW

[chest pa]
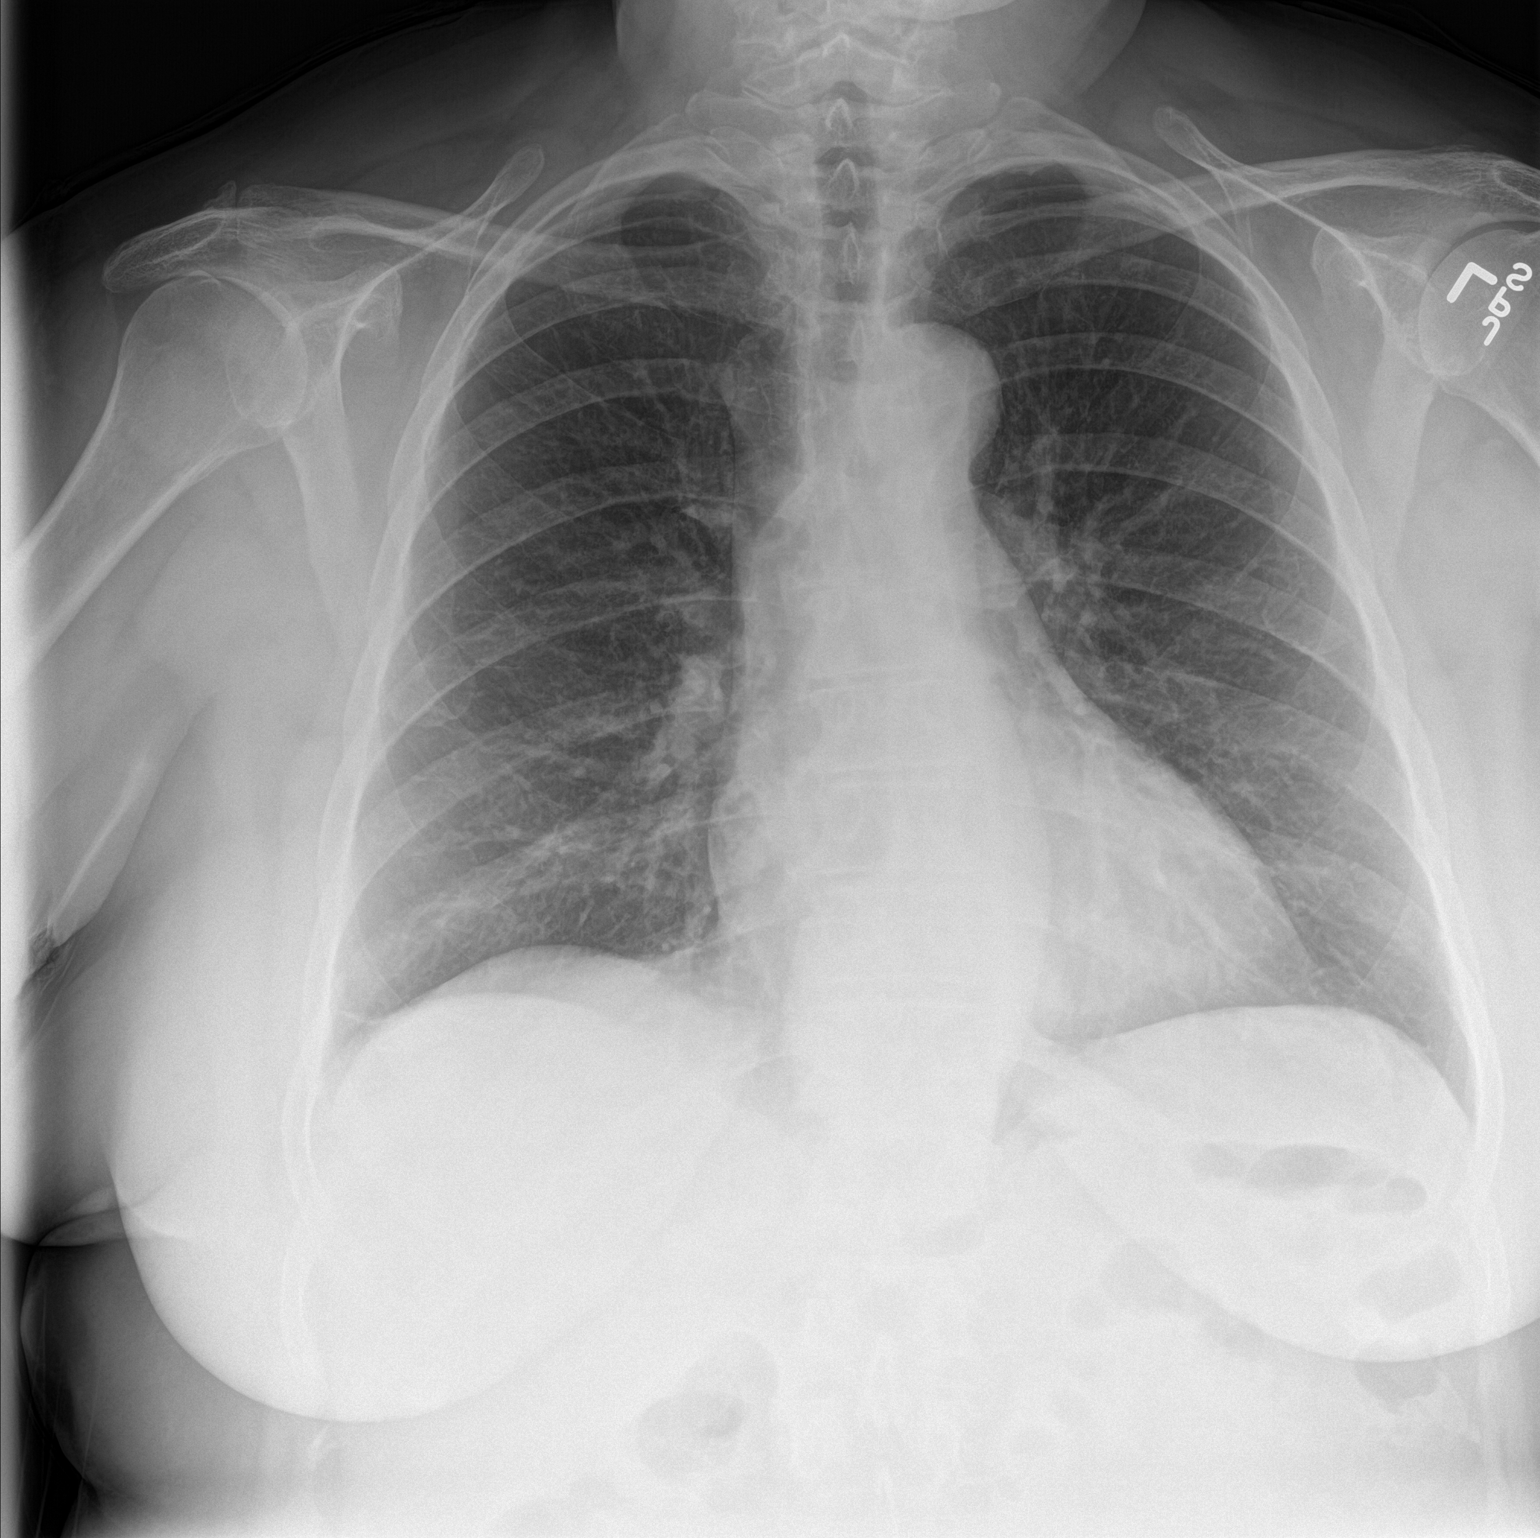

[chest lat]
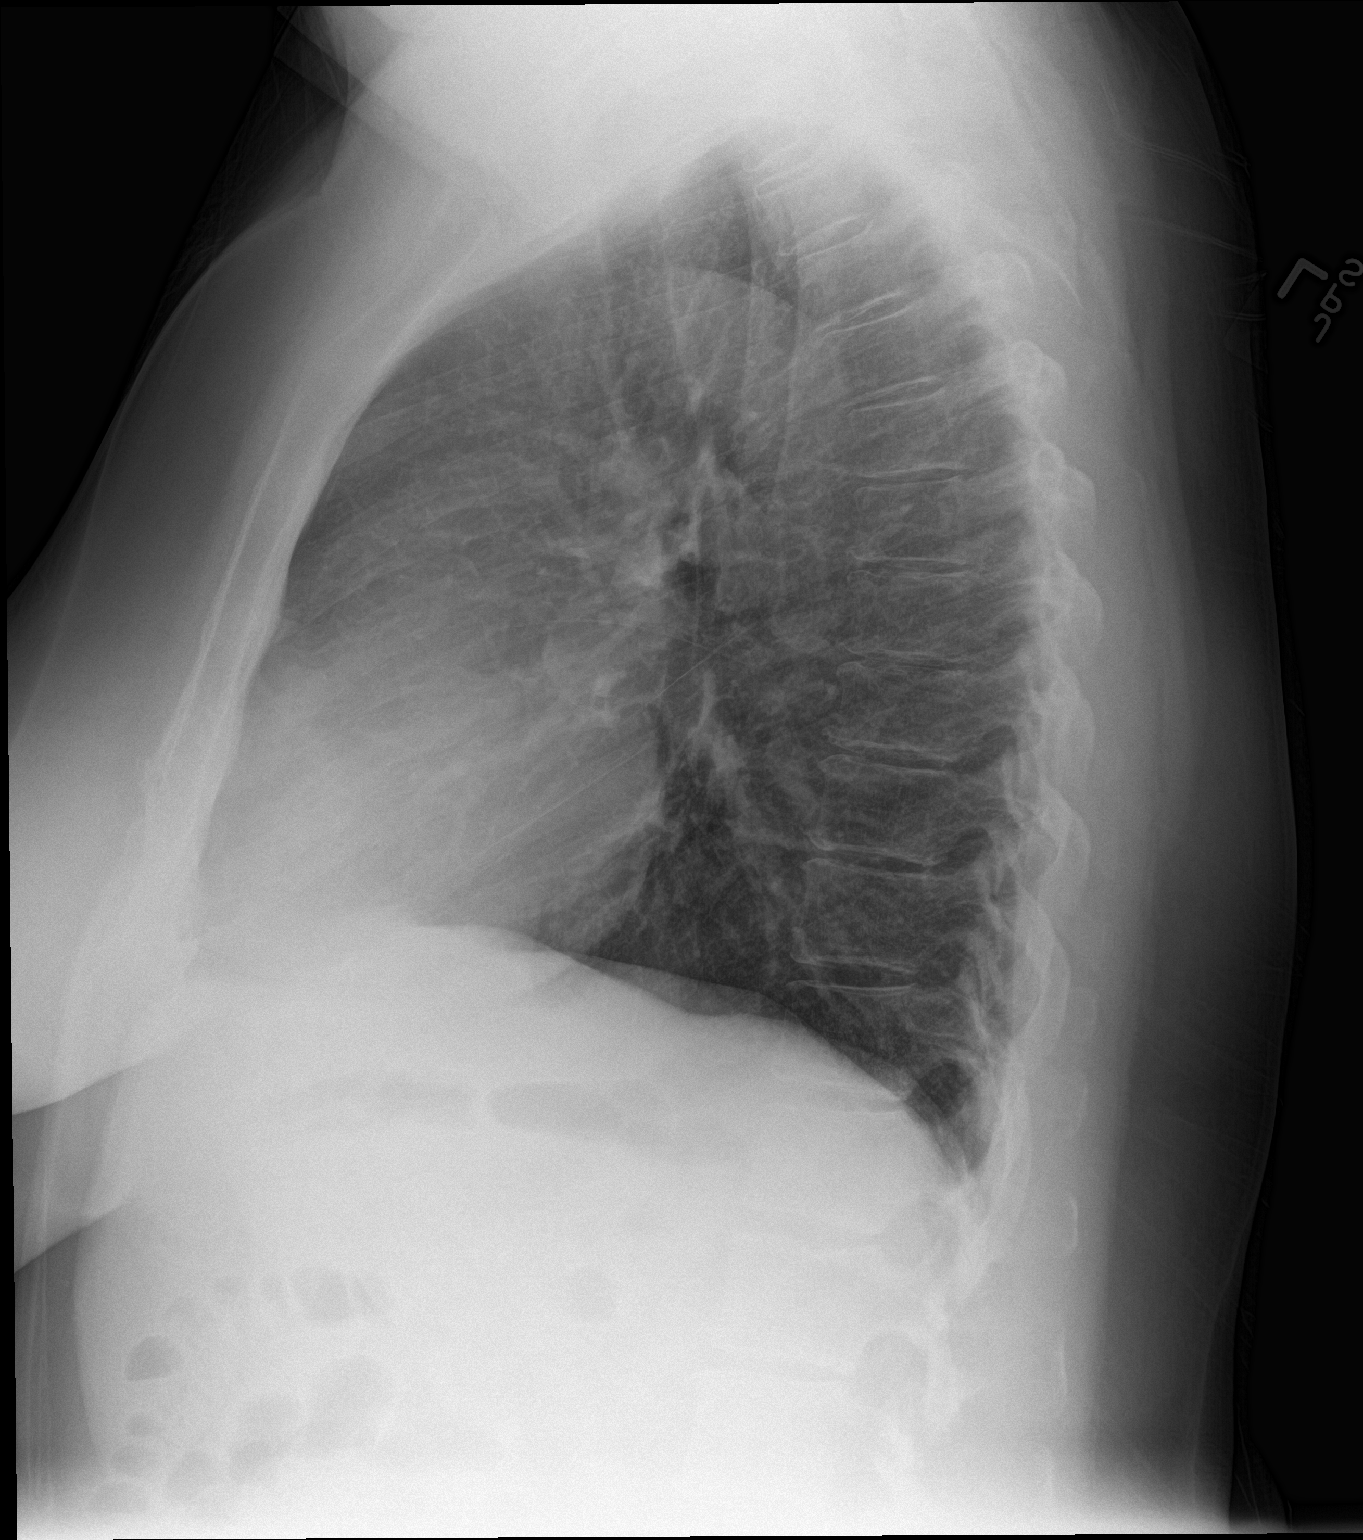

[2 of 2 positions shown; findings below may reference images not displayed]

FINDINGS: Lungs are clear. Heart size and pulmonary vascularity are normal. No
adenopathy. No bone lesions.
IMPRESSION: No edema or consolidation.

## 2017-01-12 DIAGNOSIS — M546 Pain in thoracic spine: Secondary | ICD-10-CM | POA: Diagnosis not present

## 2017-01-12 DIAGNOSIS — M545 Low back pain: Secondary | ICD-10-CM | POA: Diagnosis not present

## 2017-01-12 DIAGNOSIS — M9903 Segmental and somatic dysfunction of lumbar region: Secondary | ICD-10-CM | POA: Diagnosis not present

## 2017-01-12 DIAGNOSIS — I10 Essential (primary) hypertension: Secondary | ICD-10-CM | POA: Diagnosis not present

## 2017-01-12 DIAGNOSIS — M9905 Segmental and somatic dysfunction of pelvic region: Secondary | ICD-10-CM | POA: Diagnosis not present

## 2017-01-12 DIAGNOSIS — M9902 Segmental and somatic dysfunction of thoracic region: Secondary | ICD-10-CM | POA: Diagnosis not present

## 2017-03-15 DIAGNOSIS — Z23 Encounter for immunization: Secondary | ICD-10-CM | POA: Diagnosis not present

## 2017-04-13 DIAGNOSIS — M545 Low back pain: Secondary | ICD-10-CM | POA: Diagnosis not present

## 2017-04-13 DIAGNOSIS — Z0000 Encounter for general adult medical examination without abnormal findings: Secondary | ICD-10-CM | POA: Diagnosis not present

## 2017-04-13 DIAGNOSIS — I10 Essential (primary) hypertension: Secondary | ICD-10-CM | POA: Diagnosis not present

## 2017-04-13 DIAGNOSIS — I129 Hypertensive chronic kidney disease with stage 1 through stage 4 chronic kidney disease, or unspecified chronic kidney disease: Secondary | ICD-10-CM | POA: Diagnosis not present

## 2017-04-13 DIAGNOSIS — M1990 Unspecified osteoarthritis, unspecified site: Secondary | ICD-10-CM | POA: Diagnosis not present

## 2017-04-13 DIAGNOSIS — E785 Hyperlipidemia, unspecified: Secondary | ICD-10-CM | POA: Diagnosis not present

## 2017-04-19 ENCOUNTER — Encounter (INDEPENDENT_AMBULATORY_CARE_PROVIDER_SITE_OTHER): Payer: Self-pay | Admitting: *Deleted

## 2017-04-19 DIAGNOSIS — Z0000 Encounter for general adult medical examination without abnormal findings: Secondary | ICD-10-CM | POA: Diagnosis not present

## 2017-04-20 ENCOUNTER — Other Ambulatory Visit (INDEPENDENT_AMBULATORY_CARE_PROVIDER_SITE_OTHER): Payer: Self-pay | Admitting: *Deleted

## 2017-04-20 ENCOUNTER — Encounter (INDEPENDENT_AMBULATORY_CARE_PROVIDER_SITE_OTHER): Payer: Self-pay | Admitting: *Deleted

## 2017-04-20 DIAGNOSIS — Z1211 Encounter for screening for malignant neoplasm of colon: Secondary | ICD-10-CM | POA: Insufficient documentation

## 2017-05-17 DIAGNOSIS — Z124 Encounter for screening for malignant neoplasm of cervix: Secondary | ICD-10-CM | POA: Diagnosis not present

## 2017-05-17 DIAGNOSIS — Z01419 Encounter for gynecological examination (general) (routine) without abnormal findings: Secondary | ICD-10-CM | POA: Diagnosis not present

## 2017-05-17 DIAGNOSIS — Z1289 Encounter for screening for malignant neoplasm of other sites: Secondary | ICD-10-CM | POA: Diagnosis not present

## 2017-06-08 DIAGNOSIS — E669 Obesity, unspecified: Secondary | ICD-10-CM | POA: Diagnosis not present

## 2017-06-08 DIAGNOSIS — N2581 Secondary hyperparathyroidism of renal origin: Secondary | ICD-10-CM | POA: Diagnosis not present

## 2017-06-08 DIAGNOSIS — N183 Chronic kidney disease, stage 3 (moderate): Secondary | ICD-10-CM | POA: Diagnosis not present

## 2017-07-03 ENCOUNTER — Encounter (INDEPENDENT_AMBULATORY_CARE_PROVIDER_SITE_OTHER): Payer: Self-pay | Admitting: *Deleted

## 2017-07-03 ENCOUNTER — Telehealth (INDEPENDENT_AMBULATORY_CARE_PROVIDER_SITE_OTHER): Payer: Self-pay | Admitting: *Deleted

## 2017-07-03 MED ORDER — PEG 3350-KCL-NA BICARB-NACL 420 G PO SOLR: 4000.0000 mL | Freq: Once | ORAL | 0 refills | Status: AC

## 2017-07-03 NOTE — Telephone Encounter (Signed)
Patient needs trilyte 

## 2017-07-04 DIAGNOSIS — M79672 Pain in left foot: Secondary | ICD-10-CM | POA: Diagnosis not present

## 2017-07-04 DIAGNOSIS — M7672 Peroneal tendinitis, left leg: Secondary | ICD-10-CM | POA: Diagnosis not present

## 2017-07-20 DIAGNOSIS — E039 Hypothyroidism, unspecified: Secondary | ICD-10-CM | POA: Diagnosis not present

## 2017-07-24 DIAGNOSIS — M9903 Segmental and somatic dysfunction of lumbar region: Secondary | ICD-10-CM | POA: Diagnosis not present

## 2017-07-24 DIAGNOSIS — M546 Pain in thoracic spine: Secondary | ICD-10-CM | POA: Diagnosis not present

## 2017-07-24 DIAGNOSIS — M545 Low back pain: Secondary | ICD-10-CM | POA: Diagnosis not present

## 2017-07-24 DIAGNOSIS — I10 Essential (primary) hypertension: Secondary | ICD-10-CM | POA: Diagnosis not present

## 2017-07-24 DIAGNOSIS — M9902 Segmental and somatic dysfunction of thoracic region: Secondary | ICD-10-CM | POA: Diagnosis not present

## 2017-07-24 DIAGNOSIS — M9905 Segmental and somatic dysfunction of pelvic region: Secondary | ICD-10-CM | POA: Diagnosis not present

## 2017-07-25 DIAGNOSIS — M7672 Peroneal tendinitis, left leg: Secondary | ICD-10-CM | POA: Diagnosis not present

## 2017-07-26 ENCOUNTER — Telehealth (INDEPENDENT_AMBULATORY_CARE_PROVIDER_SITE_OTHER): Payer: Self-pay | Admitting: *Deleted

## 2017-07-26 NOTE — Telephone Encounter (Signed)
agree

## 2017-07-26 NOTE — Telephone Encounter (Signed)
Referring MD/PCP: hawkins   Procedure: tcs  Reason/Indication:  screening  Has patient had this procedure before?  Yes, 2007  If so, when, by whom and where?    Is there a family history of colon cancer?  no  Who?  What age when diagnosed?    Is patient diabetic?   no      Does patient have prosthetic heart valve or mechanical valve?  no  Do you have a pacemaker?  no  Has patient ever had endocarditis? no  Has patient had joint replacement within last 12 months?  no  Is patient constipated or do they take laxatives? yes  Does patient have a history of alcohol/drug use?  no  Is patient on blood thinner such as Coumadin, Plavix and/or Aspirin? no  Medications: see epic  Allergies: sulfur  Medication Adjustment per Dr Lindi Adie, NP:   Procedure date & time: 08/23/17 at 200

## 2017-08-10 ENCOUNTER — Telehealth (INDEPENDENT_AMBULATORY_CARE_PROVIDER_SITE_OTHER): Payer: Self-pay | Admitting: *Deleted

## 2017-08-10 MED ORDER — PEG 3350-KCL-NA BICARB-NACL 420 G PO SOLR: 4000.0000 mL | Freq: Once | ORAL | 0 refills | Status: AC

## 2017-08-10 NOTE — Telephone Encounter (Signed)
Needs trilyte 

## 2017-08-23 ENCOUNTER — Encounter (HOSPITAL_COMMUNITY): Payer: Self-pay | Admitting: *Deleted

## 2017-08-23 ENCOUNTER — Encounter (HOSPITAL_COMMUNITY)
Admission: RE | Disposition: A | Discharge status: Home / Self Care | Payer: Self-pay | Source: Ambulatory Visit | Attending: Internal Medicine

## 2017-08-23 ENCOUNTER — Ambulatory Visit (HOSPITAL_COMMUNITY)
Admission: RE | Admit: 2017-08-23 | Discharge: 2017-08-23 | Disposition: A | Discharge status: Home / Self Care | Payer: Medicare Other | Source: Ambulatory Visit | Attending: Internal Medicine | Admitting: Internal Medicine

## 2017-08-23 ENCOUNTER — Other Ambulatory Visit: Payer: Self-pay

## 2017-08-23 DIAGNOSIS — Z8719 Personal history of other diseases of the digestive system: Secondary | ICD-10-CM | POA: Diagnosis not present

## 2017-08-23 DIAGNOSIS — Z882 Allergy status to sulfonamides status: Secondary | ICD-10-CM | POA: Diagnosis not present

## 2017-08-23 DIAGNOSIS — Z79899 Other long term (current) drug therapy: Secondary | ICD-10-CM | POA: Diagnosis not present

## 2017-08-23 DIAGNOSIS — K648 Other hemorrhoids: Secondary | ICD-10-CM | POA: Insufficient documentation

## 2017-08-23 DIAGNOSIS — D122 Benign neoplasm of ascending colon: Secondary | ICD-10-CM | POA: Diagnosis not present

## 2017-08-23 DIAGNOSIS — Z885 Allergy status to narcotic agent status: Secondary | ICD-10-CM | POA: Diagnosis not present

## 2017-08-23 DIAGNOSIS — Z1211 Encounter for screening for malignant neoplasm of colon: Secondary | ICD-10-CM | POA: Diagnosis not present

## 2017-08-23 DIAGNOSIS — K5730 Diverticulosis of large intestine without perforation or abscess without bleeding: Secondary | ICD-10-CM | POA: Insufficient documentation

## 2017-08-23 DIAGNOSIS — D120 Benign neoplasm of cecum: Secondary | ICD-10-CM | POA: Diagnosis not present

## 2017-08-23 DIAGNOSIS — Z79890 Hormone replacement therapy (postmenopausal): Secondary | ICD-10-CM | POA: Diagnosis not present

## 2017-08-23 DIAGNOSIS — Z881 Allergy status to other antibiotic agents status: Secondary | ICD-10-CM | POA: Insufficient documentation

## 2017-08-23 DIAGNOSIS — N183 Chronic kidney disease, stage 3 (moderate): Secondary | ICD-10-CM | POA: Diagnosis not present

## 2017-08-23 DIAGNOSIS — Z91041 Radiographic dye allergy status: Secondary | ICD-10-CM | POA: Diagnosis not present

## 2017-08-23 DIAGNOSIS — Z96652 Presence of left artificial knee joint: Secondary | ICD-10-CM | POA: Insufficient documentation

## 2017-08-23 DIAGNOSIS — K219 Gastro-esophageal reflux disease without esophagitis: Secondary | ICD-10-CM | POA: Diagnosis not present

## 2017-08-23 DIAGNOSIS — Z96651 Presence of right artificial knee joint: Secondary | ICD-10-CM | POA: Insufficient documentation

## 2017-08-23 DIAGNOSIS — D123 Benign neoplasm of transverse colon: Secondary | ICD-10-CM | POA: Insufficient documentation

## 2017-08-23 DIAGNOSIS — I129 Hypertensive chronic kidney disease with stage 1 through stage 4 chronic kidney disease, or unspecified chronic kidney disease: Secondary | ICD-10-CM | POA: Diagnosis not present

## 2017-08-23 HISTORY — PX: POLYPECTOMY: SHX5525

## 2017-08-23 HISTORY — PX: COLONOSCOPY: SHX5424

## 2017-08-23 LAB — HM COLONOSCOPY

## 2017-08-23 SURGERY — COLONOSCOPY
Anesthesia: Moderate Sedation

## 2017-08-23 MED ORDER — MEPERIDINE HCL 50 MG/ML IJ SOLN
INTRAMUSCULAR | Status: DC
Start: 2017-08-23 — End: 2017-08-23
  Filled 2017-08-23: qty 1

## 2017-08-23 MED ORDER — STERILE WATER FOR IRRIGATION IR SOLN
Status: DC | PRN
  Administered 2017-08-23: 15 mL

## 2017-08-23 MED ORDER — MIDAZOLAM HCL 5 MG/5ML IJ SOLN
INTRAMUSCULAR | Status: AC
  Filled 2017-08-23: qty 10

## 2017-08-23 MED ORDER — MEPERIDINE HCL 50 MG/ML IJ SOLN
INTRAMUSCULAR | Status: AC
  Filled 2017-08-23: qty 1

## 2017-08-23 MED ORDER — MEPERIDINE HCL 50 MG/ML IJ SOLN
INTRAMUSCULAR | Status: DC | PRN
  Administered 2017-08-23 (×3): 25 mg via INTRAVENOUS

## 2017-08-23 MED ORDER — SODIUM CHLORIDE 0.9 % IV SOLN
INTRAVENOUS | Status: DC
  Administered 2017-08-23: 1000 mL via INTRAVENOUS

## 2017-08-23 MED ORDER — MIDAZOLAM HCL 5 MG/5ML IJ SOLN
INTRAMUSCULAR | Status: DC | PRN
  Administered 2017-08-23: 2 mg via INTRAVENOUS
  Administered 2017-08-23: 1 mg via INTRAVENOUS
  Administered 2017-08-23: 2 mg via INTRAVENOUS
  Administered 2017-08-23: 3 mg via INTRAVENOUS
  Administered 2017-08-23: 2 mg via INTRAVENOUS

## 2017-08-23 NOTE — H&P (Signed)
Deanna [name] is an 67 y.o. female.   Chief Complaint: Patient is here for colonoscopy. HPI: Patient is 67 year old Caucasian female who is here for screening colonoscopy.  She denies abdominal pain change in bowel habits or rectal bleeding.  Last exam was normal in 2007. Family history is negative for CRC.  Past Medical History:  Diagnosis Date  . Chronic kidney disease    pt. reports that during episode of pancreatitis, she experienced decreased kidney function related to contrast dye   . GERD (gastroesophageal reflux disease)   . History of hiatal hernia   . HTN (hypertension)   . Hx of pancreatitis 2015   attributes to Etodolac and Zpack  . Kidney disease, chronic, stage III (GFR 30-59 ml/min) (HCC) 05/19/2015  . Primary localized osteoarthritis of left knee   . Primary localized osteoarthritis of right knee   . Reflux     Past Surgical History:  Procedure Laterality Date  . COLONOSCOPY    . DIAGNOSTIC LAPAROSCOPY    . EUS N/A 08/15/2013   Procedure: UPPER ENDOSCOPIC ULTRASOUND (EUS) LINEAR;  Surgeon: Milus Banister, MD;  Location: [facility];  Service: Endoscopy;  Laterality: N/A;  . FOOT SURGERY    . FOOT SURGERY Bilateral    bunions  . JOINT REPLACEMENT Bilateral    knees  . KNEE ARTHROSCOPY W/ MENISCAL REPAIR Left   . LAPAROSCOPIC ENDOMETRIOSIS FULGURATION    . TOTAL KNEE ARTHROPLASTY Right 05/18/2015   Procedure: TOTAL KNEE ARTHROPLASTY; RIGHT KNEE;  Surgeon: Elsie Saas, MD;  Location: [facility];  Service: Orthopedics;  Laterality: Right;  . TOTAL KNEE ARTHROPLASTY Left 11/16/2015  . TOTAL KNEE ARTHROPLASTY Left 11/16/2015   Procedure: LEFT TOTAL KNEE ARTHROPLASTY;  Surgeon: Elsie Saas, MD;  Location: [facility];  Service: Orthopedics;  Laterality: Left;  . trigger thumb     rt thumb last year    Family History  Problem Relation Age of Onset  . Diabetes Father   . Hypertension Father   . Aneurysm Mother   . Cancer - Other Sister    Social History:  reports that  she has never smoked. She has never used smokeless tobacco. She reports that she does not drink alcohol or use drugs.  Allergies:  Allergies  Allergen Reactions  . Contrast Media [Iodinated Diagnostic Agents] Other (See Comments)    Renal function decreased   . Etodolac Other (See Comments)    Pancreatitis   . Z-Pak [Azithromycin] Other (See Comments)    Pancreatitis   . Dilaudid [Hydromorphone Hcl] Other (See Comments)     hallucinations  . Hydrocodone Nausea And Vomiting  . Oxycodone Other (See Comments)    Severe constipation  . Sulfa Antibiotics Itching and Rash    Medications Prior to Admission  Medication Sig Dispense Refill  . acetaminophen (TYLENOL) 500 MG tablet Take 500 mg by mouth every 6 (six) hours as needed (FOR PAIN.).     Marland Kitchen Alum Hydroxide-Mag Carbonate (GAVISCON EXTRA STRENGTH) 160-105 MG CHEW Chew 1 tablet by mouth at bedtime as needed (for indigestion/heartburn (alternates between GAVISCON & TUMS)).    Marland Kitchen amLODipine (NORVASC) 10 MG tablet Take 10 mg by mouth daily.    Marland Kitchen atorvastatin (LIPITOR) 20 MG tablet Take 20 mg by mouth daily after breakfast.     . calcium carbonate (TUMS EX) 750 MG chewable tablet Chew 1 tablet by mouth at bedtime as needed (for indigestion/heartburn (alternates between GAVISCON & TUMS)).    . Coenzyme Q10 (COQ10) 200 MG CAPS Take  200 mg by mouth daily.    . diphenhydrAMINE (BENADRYL) 25 MG tablet Take 25 mg by mouth at bedtime.    . docusate sodium (COLACE) 100 MG capsule 1 tab 2 times a day while on narcotics.  STOOL SOFTENER (Patient taking differently: Take 100 mg by mouth 2 (two) times daily. ) 60 capsule 0  . GAVILYTE-G 236 g solution Take 4,000 mLs by mouth once. BEFORE COLONOSCOPY  0  . Krill Oil 500 MG CAPS Take 500 mg by mouth daily.    Marland Kitchen levothyroxine (SYNTHROID, LEVOTHROID) 50 MCG tablet Take 50 mcg by mouth daily before breakfast.    . magnesium oxide (MAG-OX) 400 MG tablet Take 400 mg by mouth daily.    . Multiple Vitamin  (MULTIVITAMIN WITH MINERALS) TABS tablet Take 1 tablet by mouth at bedtime. Centrum Silver    . ranitidine (ZANTAC) 150 MG tablet Take 150 mg by mouth 3 (three) times daily before meals.    . Turmeric 500 MG CAPS Take 500 mg by mouth daily.    . vitamin B-12 (CYANOCOBALAMIN) 1000 MCG tablet Take 1,000 mcg by mouth daily.    Marland Kitchen amoxicillin (AMOXIL) 500 MG capsule Take 2,000 mg by mouth See admin instructions. TAKE 4 CAPSULES BY MOUTH 1 HOUR PRIOR TO DENTAL WORK  0    No results found for this or any previous visit (from the past 52 hour(s)). No results found.  ROS  Blood pressure 140/63, pulse 77, temperature 98.6 F (37 C), temperature source Oral, resp. rate 10, height 5' 6.5" (1.689 m), weight 235 lb (106.6 kg), SpO2 99 %. Physical Exam  Constitutional: She appears well-developed and well-nourished.  HENT:  Mouth/Throat: Oropharynx is clear and moist.  Eyes: Conjunctivae are normal. No scleral icterus.  Neck: No thyromegaly present.  Cardiovascular: Normal rate, regular rhythm and normal heart sounds.  No murmur heard. Respiratory: Effort normal and breath sounds normal.  GI:  Abdomen is full but soft and nontender with organomegaly or masses.  Musculoskeletal: She exhibits no edema.  Lymphadenopathy:    She has no cervical adenopathy.  Neurological: She is alert.  Skin: Skin is warm and dry.     Assessment/Plan Average risk screening colonoscopy.  Hildred Laser, MD 08/23/2017, 12:04 PM

## 2017-08-23 NOTE — Op Note (Signed)
[facility] Patient Name: Deanna [name] Procedure Date: 08/23/2017 11:59 AM MRN: [id number] Date of Birth: 1950/06/22 Attending MD: Hildred Laser , MD CSN: [id number] Age: 67 Admit Type: Outpatient Procedure:                Colonoscopy Indications:              Screening for colorectal malignant neoplasm Providers:                Hildred Laser, MD, Charlsie Quest. Theda Sers RN, RN, Nelma Rothman, Technician Referring MD:             Jasper Loser. Luan Pulling, MD Medicines:                Meperidine 75 mg IV, Midazolam 10 mg IV Complications:            No immediate complications. Estimated Blood Loss:     Estimated blood loss was minimal. Procedure:                Pre-Anesthesia Assessment:                           - Prior to the procedure, a History and Physical                            was performed, and patient medications and                            allergies were reviewed. The patient's tolerance of                            previous anesthesia was also reviewed. The risks                            and benefits of the procedure and the sedation                            options and risks were discussed with the patient.                            All questions were answered, and informed consent                            was obtained. Prior Anticoagulants: The patient has                            taken no previous anticoagulant or antiplatelet                            agents. ASA Grade Assessment: II - A patient with                            mild systemic disease. After reviewing the risks  and benefits, the patient was deemed in                            satisfactory condition to undergo the procedure.                           After obtaining informed consent, the colonoscope                            was passed under direct vision. Throughout the                            procedure, the patient's blood pressure, pulse, and                             oxygen saturations were monitored continuously. The                            EC-[id number] ([id number]) scope was introduced through                            the anus and advanced to the the cecum, identified                            by appendiceal orifice and ileocecal valve. The                            colonoscopy was performed without difficulty. The                            patient tolerated the procedure well. The quality                            of the bowel preparation was good. The ileocecal                            valve, appendiceal orifice, and rectum were                            photographed. Scope In: 12:17:32 PM Scope Out: 12:42:37 PM Scope Withdrawal Time: 0 hours 21 minutes 22 seconds  Total Procedure Duration: 0 hours 25 minutes 5 seconds  Findings:      The perianal and digital rectal examinations were normal.      Three polyps were found in the splenic flexure, ascending colon and       cecum. The polyps were 4 to 6 mm in size. These polyps were removed with       a cold snare. Resection and retrieval were complete. The pathology       specimen was placed into Bottle Number 1.      A 6 to 10 mm polyp was found in the cecum. The polyp was removed with a       hot snare. Resection and retrieval were complete. The pathology specimen       was placed into Bottle Number 2.  A few medium-mouthed diverticula were found in the sigmoid colon.      Internal hemorrhoids were found during retroflexion. The hemorrhoids       were small. Impression:               - Three 4 to 6 mm polyps at the splenic flexure, in                            the ascending colon and in the cecum, removed with                            a cold snare. Resected and retrieved.                           - One 6 to 10 mm polyp in the cecum, removed with a                            hot snare. Resected and retrieved.                           - Diverticulosis in  the sigmoid colon.                           - Internal hemorrhoids. Moderate Sedation:      Moderate (conscious) sedation was administered by the endoscopy nurse       and supervised by the endoscopist. The following parameters were       monitored: oxygen saturation, heart rate, blood pressure, CO2       capnography and response to care. Total physician intraservice time was       31 minutes. Recommendation:           - Patient has a contact number available for                            emergencies. The signs and symptoms of potential                            delayed complications were discussed with the                            patient. Return to normal activities tomorrow.                            Written discharge instructions were provided to the                            patient.                           - High fiber diet today.                           - Continue present medications.                           - No aspirin,  ibuprofen, naproxen, or other                            non-steroidal anti-inflammatory drugs for 7 days                            after polyp removal.                           - Await pathology results.                           - Repeat colonoscopy in 5 years for surveillance. Procedure Code(s):        --- Professional ---                           [phone], Colonoscopy, flexible; with removal of                            tumor(s), polyp(s), or other lesion(s) by snare                            technique                           G0500, Moderate sedation services provided by the                            same physician or other qualified health care                            professional performing a gastrointestinal                            endoscopic service that sedation supports,                            requiring the presence of an independent trained                            observer to assist in the monitoring of the                             patient's level of consciousness and physiological                            status; initial 15 minutes of intra-service time;                            patient age 17 years or older (additional time may                            be reported with [phone], as appropriate)                           [phone],  Moderate sedation services provided by the                            same physician or other qualified health care                            professional performing the diagnostic or                            therapeutic service that the sedation supports,                            requiring the presence of an independent trained                            observer to assist in the monitoring of the                            patient's level of consciousness and physiological                            status; each additional 15 minutes intraservice                            time (List separately in addition to code for                            primary service) Diagnosis Code(s):        --- Professional ---                           Z12.11, Encounter for screening for malignant                            neoplasm of colon                           D12.3, Benign neoplasm of transverse colon (hepatic                            flexure or splenic flexure)                           D12.2, Benign neoplasm of ascending colon                           D12.0, Benign neoplasm of cecum                           K64.8, Other hemorrhoids                           K57.30, Diverticulosis of large intestine without                            perforation or abscess without bleeding CPT copyright  2017 American [employer]. All rights reserved. The codes documented in this report are preliminary and upon coder review may  be revised to meet current compliance requirements. Hildred Laser, MD Hildred Laser, MD 08/23/2017 12:51:03 PM This report has been signed  electronically. Number of Addenda: 0

## 2017-08-23 NOTE — Discharge Instructions (Signed)
Colon Polyps Polyps are tissue growths inside the body. Polyps can grow in many places, including the large intestine (colon). A polyp may be a round bump or a mushroom-shaped growth. You could have one polyp or several. Most colon polyps are noncancerous (benign). However, some colon polyps can become cancerous over time. What are the causes? The exact cause of colon polyps is not known. What increases the risk? This condition is more likely to develop in people who:  Have a family history of colon cancer or colon polyps.  Are older than 46 or older than 45 if they are African American.  Have inflammatory bowel disease, such as ulcerative colitis or Crohn disease.  Are overweight.  Smoke cigarettes.  Do not get enough exercise.  Drink too much alcohol.  Eat a diet that is: ? High in fat and red meat. ? Low in fiber.  Had childhood cancer that was treated with abdominal radiation.  What are the signs or symptoms? Most polyps do not cause symptoms. If you have symptoms, they may include:  Blood coming from your rectum when having a bowel movement.  Blood in your stool.The stool may look dark red or black.  A change in bowel habits, such as constipation or diarrhea.  How is this diagnosed? This condition is diagnosed with a colonoscopy. This is a procedure that uses a lighted, flexible scope to look at the inside of your colon. How is this treated? Treatment for this condition involves removing any polyps that are found. Those polyps will then be tested for cancer. If cancer is found, your health care provider will talk to you about options for colon cancer treatment. Follow these instructions at home: Diet  Eat plenty of fiber, such as fruits, vegetables, and whole grains.  Eat foods that are high in calcium and vitamin D, such as milk, cheese, yogurt, eggs, liver, fish, and broccoli.  Limit foods high in fat, red meats, and processed meats, such as hot dogs,  sausage, bacon, and lunch meats.  Maintain a healthy weight, or lose weight if recommended by your health care provider. General instructions  Do not smoke cigarettes.  Do not drink alcohol excessively.  Keep all follow-up visits as told by your health care provider. This is important. This includes keeping regularly scheduled colonoscopies. Talk to your health care provider about when you need a colonoscopy.  Exercise every day or as told by your health care provider. Contact a health care provider if:  You have new or worsening bleeding during a bowel movement.  You have new or increased blood in your stool.  You have a change in bowel habits.  You unexpectedly lose weight. This information is not intended to replace advice given to you by your health care provider. Make sure you discuss any questions you have with your health care provider. Document Released: 01/13/2004 Document Revised: 09/24/2015 Document Reviewed: 03/09/2015 Elsevier Interactive Patient Education  2018 [employer]. No aspirin or NSAIDs for 1 week. Resume other medications as before. High-fiber diet. No driving for 24 hours. Physician will call with biopsy results.   Colonoscopy, Adult, Care After This sheet gives you information about how to care for yourself after your procedure. Your doctor may also give you more specific instructions. If you have problems or questions, call your doctor. Follow these instructions at home: General instructions   For the first 24 hours after the procedure: ? Do not drive or use machinery. ? Do not sign important documents. ?  Do not drink alcohol. ? Do your daily activities more slowly than normal. ? Eat foods that are soft and easy to digest. ? Rest often.  Take over-the-counter or prescription medicines only as told by your doctor.  It is up to you to get the results of your procedure. Ask your doctor, or the department performing the procedure, when your  results will be ready. To help cramping and bloating:  Try walking around.  Put heat on your belly (abdomen) as told by your doctor. Use a heat source that your doctor recommends, such as a moist heat pack or a heating pad. ? Put a towel between your skin and the heat source. ? Leave the heat on for 20-30 minutes. ? Remove the heat if your skin turns bright red. This is especially important if you cannot feel pain, heat, or cold. You can get burned. Eating and drinking  Drink enough fluid to keep your pee (urine) clear or pale yellow.  Return to your normal diet as told by your doctor. Avoid heavy or fried foods that are hard to digest.  Avoid drinking alcohol for as long as told by your doctor. Contact a doctor if:  You have blood in your poop (stool) 2-3 days after the procedure. Get help right away if:  You have more than a small amount of blood in your poop.  You see large clumps of tissue (blood clots) in your poop.  Your belly is swollen.  You feel sick to your stomach (nauseous).  You throw up (vomit).  You have a fever.  You have belly pain that gets worse, and medicine does not help your pain. This information is not intended to replace advice given to you by your health care provider. Make sure you discuss any questions you have with your health care provider. Document Released: 05/21/2010 Document Revised: 01/11/2016 Document Reviewed: 01/11/2016 Elsevier Interactive Patient Education  2017 [employer]  Hemorrhoids Hemorrhoids are swollen veins in and around the rectum or anus. There are two types of hemorrhoids:  Internal hemorrhoids. These occur in the veins that are just inside the rectum. They may poke through to the outside and become irritated and painful.  External hemorrhoids. These occur in the veins that are outside of the anus and can be felt as a painful swelling or hard lump near the anus.  Most hemorrhoids do not cause serious problems, and  they can be managed with home treatments such as diet and lifestyle changes. If home treatments do not help your symptoms, procedures can be done to shrink or remove the hemorrhoids. What are the causes? This condition is caused by increased pressure in the anal area. This pressure may result from various things, including:  Constipation.  Straining to have a bowel movement.  Diarrhea.  Pregnancy.  Obesity.  Sitting for long periods of time.  Heavy lifting or other activity that causes you to strain.  Anal sex.  What are the signs or symptoms? Symptoms of this condition include:  Pain.  Anal itching or irritation.  Rectal bleeding.  Leakage of stool (feces).  Anal swelling.  One or more lumps around the anus.  How is this diagnosed? This condition can often be diagnosed through a visual exam. Other exams or tests may also be done, such as:  Examination of the rectal area with a gloved hand (digital rectal exam).  Examination of the anal canal using a small tube (anoscope).  A blood test, if you have lost a  significant amount of blood.  A test to look inside the colon (sigmoidoscopy or colonoscopy).  How is this treated? This condition can usually be treated at home. However, various procedures may be done if dietary changes, lifestyle changes, and other home treatments do not help your symptoms. These procedures can help make the hemorrhoids smaller or remove them completely. Some of these procedures involve surgery, and others do not. Common procedures include:  Rubber band ligation. Rubber bands are placed at the base of the hemorrhoids to cut off the blood supply to them.  Sclerotherapy. Medicine is injected into the hemorrhoids to shrink them.  Infrared coagulation. A type of light energy is used to get rid of the hemorrhoids.  Hemorrhoidectomy surgery. The hemorrhoids are surgically removed, and the veins that supply them are tied off.  Stapled  hemorrhoidopexy surgery. A circular stapling device is used to remove the hemorrhoids and use staples to cut off the blood supply to them.  Follow these instructions at home: Eating and drinking  Eat foods that have a lot of fiber in them, such as whole grains, beans, nuts, fruits, and vegetables. Ask your health care provider about taking products that have added fiber (fiber supplements).  Drink enough fluid to keep your urine clear or pale yellow. Managing pain and swelling  Take warm sitz baths for 20 minutes, 3-4 times a day to ease pain and discomfort.  If directed, apply ice to the affected area. Using ice packs between sitz baths may be helpful. ? Put ice in a plastic bag. ? Place a towel between your skin and the bag. ? Leave the ice on for 20 minutes, 2-3 times a day. General instructions  Take over-the-counter and prescription medicines only as told by your health care provider.  Use medicated creams or suppositories as told.  Exercise regularly.  Go to the bathroom when you have the urge to have a bowel movement. Do not wait.  Avoid straining to have bowel movements.  Keep the anal area dry and clean. Use wet toilet paper or moist towelettes after a bowel movement.  Do not sit on the toilet for long periods of time. This increases blood pooling and pain. Contact a health care provider if:  You have increasing pain and swelling that are not controlled by treatment or medicine.  You have uncontrolled bleeding.  You have difficulty having a bowel movement, or you are unable to have a bowel movement.  You have pain or inflammation outside the area of the hemorrhoids. This information is not intended to replace advice given to you by your health care provider. Make sure you discuss any questions you have with your health care provider. Document Released: 04/15/2000 Document Revised: 09/16/2015 Document Reviewed: 12/31/2014 Elsevier Interactive Patient Education  2018  [employer].  Colon Polyps Polyps are tissue growths inside the body. Polyps can grow in many places, including the large intestine (colon). A polyp may be a round bump or a mushroom-shaped growth. You could have one polyp or several. Most colon polyps are noncancerous (benign). However, some colon polyps can become cancerous over time. What are the causes? The exact cause of colon polyps is not known. What increases the risk? This condition is more likely to develop in people who:  Have a family history of colon cancer or colon polyps.  Are older than 62 or older than 45 if they are African American.  Have inflammatory bowel disease, such as ulcerative colitis or Crohn disease.  Are overweight.  Smoke cigarettes.  Do not get enough exercise.  Drink too much alcohol.  Eat a diet that is: ? High in fat and red meat. ? Low in fiber.  Had childhood cancer that was treated with abdominal radiation.  What are the signs or symptoms? Most polyps do not cause symptoms. If you have symptoms, they may include:  Blood coming from your rectum when having a bowel movement.  Blood in your stool.The stool may look dark red or black.  A change in bowel habits, such as constipation or diarrhea.  How is this diagnosed? This condition is diagnosed with a colonoscopy. This is a procedure that uses a lighted, flexible scope to look at the inside of your colon. How is this treated? Treatment for this condition involves removing any polyps that are found. Those polyps will then be tested for cancer. If cancer is found, your health care provider will talk to you about options for colon cancer treatment. Follow these instructions at home: Diet  Eat plenty of fiber, such as fruits, vegetables, and whole grains.  Eat foods that are high in calcium and vitamin D, such as milk, cheese, yogurt, eggs, liver, fish, and broccoli.  Limit foods high in fat, red meats, and processed meats, such as  hot dogs, sausage, bacon, and lunch meats.  Maintain a healthy weight, or lose weight if recommended by your health care provider. General instructions  Do not smoke cigarettes.  Do not drink alcohol excessively.  Keep all follow-up visits as told by your health care provider. This is important. This includes keeping regularly scheduled colonoscopies. Talk to your health care provider about when you need a colonoscopy.  Exercise every day or as told by your health care provider. Contact a health care provider if:  You have new or worsening bleeding during a bowel movement.  You have new or increased blood in your stool.  You have a change in bowel habits.  You unexpectedly lose weight. This information is not intended to replace advice given to you by your health care provider. Make sure you discuss any questions you have with your health care provider. Document Released: 01/13/2004 Document Revised: 09/24/2015 Document Reviewed: 03/09/2015 Elsevier Interactive Patient Education  [employer].

## 2017-08-28 ENCOUNTER — Encounter (HOSPITAL_COMMUNITY): Payer: Self-pay | Admitting: Internal Medicine

## 2017-09-27 ENCOUNTER — Other Ambulatory Visit (HOSPITAL_COMMUNITY): Payer: Self-pay | Admitting: Pulmonary Disease

## 2017-09-27 DIAGNOSIS — Z1231 Encounter for screening mammogram for malignant neoplasm of breast: Secondary | ICD-10-CM

## 2017-11-01 ENCOUNTER — Ambulatory Visit (HOSPITAL_COMMUNITY)
Admission: RE | Admit: 2017-11-01 | Discharge: 2017-11-01 | Disposition: A | Discharge status: Home / Self Care | Payer: Medicare Other | Source: Ambulatory Visit | Attending: Pulmonary Disease | Admitting: Pulmonary Disease

## 2017-11-01 DIAGNOSIS — Z1231 Encounter for screening mammogram for malignant neoplasm of breast: Secondary | ICD-10-CM | POA: Insufficient documentation

## 2017-11-09 DIAGNOSIS — L7211 Pilar cyst: Secondary | ICD-10-CM | POA: Diagnosis not present

## 2017-11-09 DIAGNOSIS — Z1283 Encounter for screening for malignant neoplasm of skin: Secondary | ICD-10-CM | POA: Diagnosis not present

## 2017-11-09 DIAGNOSIS — L821 Other seborrheic keratosis: Secondary | ICD-10-CM | POA: Diagnosis not present

## 2017-12-01 ENCOUNTER — Other Ambulatory Visit: Payer: Self-pay

## 2018-01-10 DIAGNOSIS — H43812 Vitreous degeneration, left eye: Secondary | ICD-10-CM | POA: Diagnosis not present

## 2018-01-12 DIAGNOSIS — I10 Essential (primary) hypertension: Secondary | ICD-10-CM | POA: Diagnosis not present

## 2018-01-12 DIAGNOSIS — M1990 Unspecified osteoarthritis, unspecified site: Secondary | ICD-10-CM | POA: Diagnosis not present

## 2018-01-12 DIAGNOSIS — K861 Other chronic pancreatitis: Secondary | ICD-10-CM | POA: Diagnosis not present

## 2018-01-12 DIAGNOSIS — I129 Hypertensive chronic kidney disease with stage 1 through stage 4 chronic kidney disease, or unspecified chronic kidney disease: Secondary | ICD-10-CM | POA: Diagnosis not present

## 2018-01-12 DIAGNOSIS — M545 Low back pain: Secondary | ICD-10-CM | POA: Diagnosis not present

## 2018-01-12 DIAGNOSIS — E039 Hypothyroidism, unspecified: Secondary | ICD-10-CM | POA: Diagnosis not present

## 2018-01-12 DIAGNOSIS — E785 Hyperlipidemia, unspecified: Secondary | ICD-10-CM | POA: Diagnosis not present

## 2018-01-12 DIAGNOSIS — N183 Chronic kidney disease, stage 3 (moderate): Secondary | ICD-10-CM | POA: Diagnosis not present

## 2018-01-12 DIAGNOSIS — Z96653 Presence of artificial knee joint, bilateral: Secondary | ICD-10-CM | POA: Diagnosis not present

## 2018-01-16 DIAGNOSIS — Z23 Encounter for immunization: Secondary | ICD-10-CM | POA: Diagnosis not present

## 2018-01-16 DIAGNOSIS — N183 Chronic kidney disease, stage 3 (moderate): Secondary | ICD-10-CM | POA: Diagnosis not present

## 2018-01-16 DIAGNOSIS — E669 Obesity, unspecified: Secondary | ICD-10-CM | POA: Diagnosis not present

## 2018-01-16 DIAGNOSIS — I129 Hypertensive chronic kidney disease with stage 1 through stage 4 chronic kidney disease, or unspecified chronic kidney disease: Secondary | ICD-10-CM | POA: Diagnosis not present

## 2018-01-16 DIAGNOSIS — E785 Hyperlipidemia, unspecified: Secondary | ICD-10-CM | POA: Diagnosis not present

## 2018-01-18 DIAGNOSIS — N183 Chronic kidney disease, stage 3 (moderate): Secondary | ICD-10-CM | POA: Diagnosis not present

## 2018-01-18 DIAGNOSIS — I10 Essential (primary) hypertension: Secondary | ICD-10-CM | POA: Diagnosis not present

## 2018-01-18 DIAGNOSIS — I129 Hypertensive chronic kidney disease with stage 1 through stage 4 chronic kidney disease, or unspecified chronic kidney disease: Secondary | ICD-10-CM | POA: Diagnosis not present

## 2018-03-12 DIAGNOSIS — M545 Low back pain: Secondary | ICD-10-CM | POA: Diagnosis not present

## 2018-03-12 DIAGNOSIS — M9905 Segmental and somatic dysfunction of pelvic region: Secondary | ICD-10-CM | POA: Diagnosis not present

## 2018-03-12 DIAGNOSIS — M9902 Segmental and somatic dysfunction of thoracic region: Secondary | ICD-10-CM | POA: Diagnosis not present

## 2018-03-12 DIAGNOSIS — M9903 Segmental and somatic dysfunction of lumbar region: Secondary | ICD-10-CM | POA: Diagnosis not present

## 2018-03-12 DIAGNOSIS — M25552 Pain in left hip: Secondary | ICD-10-CM | POA: Diagnosis not present

## 2018-04-09 DIAGNOSIS — M545 Low back pain: Secondary | ICD-10-CM | POA: Diagnosis not present

## 2018-04-09 DIAGNOSIS — M9903 Segmental and somatic dysfunction of lumbar region: Secondary | ICD-10-CM | POA: Diagnosis not present

## 2018-04-09 DIAGNOSIS — M9905 Segmental and somatic dysfunction of pelvic region: Secondary | ICD-10-CM | POA: Diagnosis not present

## 2018-04-09 DIAGNOSIS — M9902 Segmental and somatic dysfunction of thoracic region: Secondary | ICD-10-CM | POA: Diagnosis not present

## 2018-04-09 DIAGNOSIS — M25552 Pain in left hip: Secondary | ICD-10-CM | POA: Diagnosis not present

## 2018-04-11 ENCOUNTER — Encounter: Payer: Self-pay | Admitting: Family Medicine

## 2018-04-11 DIAGNOSIS — N183 Chronic kidney disease, stage 3 (moderate): Secondary | ICD-10-CM | POA: Diagnosis not present

## 2018-04-11 DIAGNOSIS — I129 Hypertensive chronic kidney disease with stage 1 through stage 4 chronic kidney disease, or unspecified chronic kidney disease: Secondary | ICD-10-CM | POA: Diagnosis not present

## 2018-04-17 DIAGNOSIS — Z0000 Encounter for general adult medical examination without abnormal findings: Secondary | ICD-10-CM | POA: Diagnosis not present

## 2018-04-30 ENCOUNTER — Other Ambulatory Visit: Payer: Self-pay | Admitting: General Surgery

## 2018-04-30 ENCOUNTER — Other Ambulatory Visit (HOSPITAL_COMMUNITY): Payer: Self-pay | Admitting: General Surgery

## 2018-04-30 DIAGNOSIS — K8689 Other specified diseases of pancreas: Secondary | ICD-10-CM

## 2018-05-07 DIAGNOSIS — M79645 Pain in left finger(s): Secondary | ICD-10-CM | POA: Diagnosis not present

## 2018-05-08 ENCOUNTER — Ambulatory Visit (HOSPITAL_COMMUNITY)
Admission: RE | Admit: 2018-05-08 | Discharge: 2018-05-08 | Disposition: A | Discharge status: Home / Self Care | Payer: Medicare Other | Source: Ambulatory Visit | Attending: General Surgery | Admitting: General Surgery

## 2018-05-08 ENCOUNTER — Other Ambulatory Visit (HOSPITAL_COMMUNITY): Payer: Self-pay | Admitting: General Surgery

## 2018-05-08 DIAGNOSIS — K8689 Other specified diseases of pancreas: Secondary | ICD-10-CM

## 2018-05-08 DIAGNOSIS — K869 Disease of pancreas, unspecified: Secondary | ICD-10-CM | POA: Diagnosis not present

## 2018-05-08 DIAGNOSIS — Z23 Encounter for immunization: Secondary | ICD-10-CM | POA: Diagnosis not present

## 2018-05-08 NOTE — Progress Notes (Signed)
Please let patient know that MR looks unchanged and benign.

## 2018-05-09 ENCOUNTER — Other Ambulatory Visit: Payer: Self-pay | Admitting: Sports Medicine

## 2018-05-09 ENCOUNTER — Other Ambulatory Visit (HOSPITAL_COMMUNITY): Payer: Self-pay | Admitting: Sports Medicine

## 2018-05-09 DIAGNOSIS — M79645 Pain in left finger(s): Secondary | ICD-10-CM

## 2018-05-09 DIAGNOSIS — M5442 Lumbago with sciatica, left side: Secondary | ICD-10-CM | POA: Diagnosis not present

## 2018-05-09 DIAGNOSIS — M9905 Segmental and somatic dysfunction of pelvic region: Secondary | ICD-10-CM | POA: Diagnosis not present

## 2018-05-09 DIAGNOSIS — M9903 Segmental and somatic dysfunction of lumbar region: Secondary | ICD-10-CM | POA: Diagnosis not present

## 2018-05-09 DIAGNOSIS — M9902 Segmental and somatic dysfunction of thoracic region: Secondary | ICD-10-CM | POA: Diagnosis not present

## 2018-05-09 DIAGNOSIS — M546 Pain in thoracic spine: Secondary | ICD-10-CM | POA: Diagnosis not present

## 2018-05-11 ENCOUNTER — Ambulatory Visit (HOSPITAL_COMMUNITY)
Admission: RE | Admit: 2018-05-11 | Discharge: 2018-05-11 | Disposition: A | Discharge status: Home / Self Care | Payer: Medicare Other | Source: Ambulatory Visit | Attending: Sports Medicine | Admitting: Sports Medicine

## 2018-05-11 DIAGNOSIS — M79645 Pain in left finger(s): Secondary | ICD-10-CM | POA: Insufficient documentation

## 2018-05-11 DIAGNOSIS — S63642A Sprain of metacarpophalangeal joint of left thumb, initial encounter: Secondary | ICD-10-CM | POA: Diagnosis not present

## 2018-05-28 DIAGNOSIS — M79645 Pain in left finger(s): Secondary | ICD-10-CM | POA: Diagnosis not present

## 2018-06-06 DIAGNOSIS — M9905 Segmental and somatic dysfunction of pelvic region: Secondary | ICD-10-CM | POA: Diagnosis not present

## 2018-06-06 DIAGNOSIS — M5442 Lumbago with sciatica, left side: Secondary | ICD-10-CM | POA: Diagnosis not present

## 2018-06-06 DIAGNOSIS — M9903 Segmental and somatic dysfunction of lumbar region: Secondary | ICD-10-CM | POA: Diagnosis not present

## 2018-06-06 DIAGNOSIS — M546 Pain in thoracic spine: Secondary | ICD-10-CM | POA: Diagnosis not present

## 2018-06-06 DIAGNOSIS — M9902 Segmental and somatic dysfunction of thoracic region: Secondary | ICD-10-CM | POA: Diagnosis not present

## 2018-07-04 DIAGNOSIS — M9902 Segmental and somatic dysfunction of thoracic region: Secondary | ICD-10-CM | POA: Diagnosis not present

## 2018-07-04 DIAGNOSIS — M545 Low back pain: Secondary | ICD-10-CM | POA: Diagnosis not present

## 2018-07-04 DIAGNOSIS — M9905 Segmental and somatic dysfunction of pelvic region: Secondary | ICD-10-CM | POA: Diagnosis not present

## 2018-07-04 DIAGNOSIS — M9903 Segmental and somatic dysfunction of lumbar region: Secondary | ICD-10-CM | POA: Diagnosis not present

## 2018-07-04 DIAGNOSIS — M546 Pain in thoracic spine: Secondary | ICD-10-CM | POA: Diagnosis not present

## 2018-11-06 ENCOUNTER — Other Ambulatory Visit (HOSPITAL_COMMUNITY): Payer: Self-pay | Admitting: Pulmonary Disease

## 2018-11-06 DIAGNOSIS — Z1231 Encounter for screening mammogram for malignant neoplasm of breast: Secondary | ICD-10-CM

## 2018-11-07 ENCOUNTER — Encounter: Payer: Self-pay | Admitting: Family Medicine

## 2018-11-07 DIAGNOSIS — E039 Hypothyroidism, unspecified: Secondary | ICD-10-CM | POA: Diagnosis not present

## 2018-11-07 DIAGNOSIS — N183 Chronic kidney disease, stage 3 (moderate): Secondary | ICD-10-CM | POA: Diagnosis not present

## 2018-11-07 DIAGNOSIS — E669 Obesity, unspecified: Secondary | ICD-10-CM | POA: Diagnosis not present

## 2018-11-07 DIAGNOSIS — I10 Essential (primary) hypertension: Secondary | ICD-10-CM | POA: Diagnosis not present

## 2018-11-07 DIAGNOSIS — I129 Hypertensive chronic kidney disease with stage 1 through stage 4 chronic kidney disease, or unspecified chronic kidney disease: Secondary | ICD-10-CM | POA: Diagnosis not present

## 2018-11-07 DIAGNOSIS — M545 Low back pain: Secondary | ICD-10-CM | POA: Diagnosis not present

## 2018-11-07 DIAGNOSIS — Z96653 Presence of artificial knee joint, bilateral: Secondary | ICD-10-CM | POA: Diagnosis not present

## 2018-11-07 DIAGNOSIS — K861 Other chronic pancreatitis: Secondary | ICD-10-CM | POA: Diagnosis not present

## 2018-11-07 DIAGNOSIS — E785 Hyperlipidemia, unspecified: Secondary | ICD-10-CM | POA: Diagnosis not present

## 2018-11-07 DIAGNOSIS — M1990 Unspecified osteoarthritis, unspecified site: Secondary | ICD-10-CM | POA: Diagnosis not present

## 2018-11-16 ENCOUNTER — Ambulatory Visit (HOSPITAL_COMMUNITY)
Admission: RE | Admit: 2018-11-16 | Discharge: 2018-11-16 | Disposition: A | Discharge status: Home / Self Care | Payer: Medicare Other | Source: Ambulatory Visit | Attending: Pulmonary Disease | Admitting: Pulmonary Disease

## 2018-11-16 ENCOUNTER — Other Ambulatory Visit: Payer: Self-pay

## 2018-11-16 DIAGNOSIS — Z1231 Encounter for screening mammogram for malignant neoplasm of breast: Secondary | ICD-10-CM | POA: Diagnosis not present

## 2018-11-16 LAB — HM MAMMOGRAPHY

## 2019-01-08 DIAGNOSIS — Z96653 Presence of artificial knee joint, bilateral: Secondary | ICD-10-CM | POA: Diagnosis not present

## 2019-01-08 DIAGNOSIS — M7652 Patellar tendinitis, left knee: Secondary | ICD-10-CM | POA: Diagnosis not present

## 2019-01-28 DIAGNOSIS — Z23 Encounter for immunization: Secondary | ICD-10-CM | POA: Diagnosis not present

## 2019-03-07 DIAGNOSIS — L84 Corns and callosities: Secondary | ICD-10-CM | POA: Diagnosis not present

## 2019-03-07 DIAGNOSIS — Z1283 Encounter for screening for malignant neoplasm of skin: Secondary | ICD-10-CM | POA: Diagnosis not present

## 2019-03-07 DIAGNOSIS — L7211 Pilar cyst: Secondary | ICD-10-CM | POA: Diagnosis not present

## 2019-03-07 DIAGNOSIS — D225 Melanocytic nevi of trunk: Secondary | ICD-10-CM | POA: Diagnosis not present

## 2019-03-18 DIAGNOSIS — I129 Hypertensive chronic kidney disease with stage 1 through stage 4 chronic kidney disease, or unspecified chronic kidney disease: Secondary | ICD-10-CM | POA: Diagnosis not present

## 2019-03-18 DIAGNOSIS — E785 Hyperlipidemia, unspecified: Secondary | ICD-10-CM | POA: Diagnosis not present

## 2019-03-18 DIAGNOSIS — N183 Chronic kidney disease, stage 3 unspecified: Secondary | ICD-10-CM | POA: Diagnosis not present

## 2019-03-18 DIAGNOSIS — E039 Hypothyroidism, unspecified: Secondary | ICD-10-CM | POA: Diagnosis not present

## 2019-03-18 DIAGNOSIS — I10 Essential (primary) hypertension: Secondary | ICD-10-CM | POA: Diagnosis not present

## 2019-03-18 LAB — COMPREHENSIVE METABOLIC PANEL
Albumin: 4.1 (ref 3.5–5.0)
Calcium: 9.3 (ref 8.7–10.7)
GFR calc Af Amer: 46
GFR calc non Af Amer: 40
Globulin: 2.8

## 2019-03-18 LAB — CBC AND DIFFERENTIAL
HCT: 38 (ref 36–46)
Hemoglobin: 12.7 (ref 12.0–16.0)
Platelets: 378 (ref 150–399)
WBC: 9.7

## 2019-03-18 LAB — BASIC METABOLIC PANEL
BUN: 17 (ref 4–21)
CO2: 29 — AB (ref 13–22)
Chloride: 104 (ref 99–108)
Creatinine: 1.4 — AB (ref <=1.1)
Glucose: 88
Potassium: 3.9 (ref 3.4–5.3)
Sodium: 140 (ref 137–147)

## 2019-03-18 LAB — CBC: RBC: 4.2 (ref 3.87–5.11)

## 2019-03-18 LAB — TSH: TSH: 4.69 (ref <=5.90)

## 2019-03-18 LAB — HEMOGLOBIN A1C: Hemoglobin A1C: 5.3

## 2019-03-18 LAB — LIPID PANEL
Cholesterol: 144 (ref 0–200)
HDL: 51 (ref 35–70)
LDL Cholesterol: 74
Triglycerides: 100 (ref 40–160)

## 2019-03-21 DIAGNOSIS — L7211 Pilar cyst: Secondary | ICD-10-CM | POA: Diagnosis not present

## 2019-03-27 ENCOUNTER — Other Ambulatory Visit: Payer: Self-pay

## 2019-04-03 DIAGNOSIS — K2100 Gastro-esophageal reflux disease with esophagitis, without bleeding: Secondary | ICD-10-CM | POA: Diagnosis not present

## 2019-04-03 DIAGNOSIS — N182 Chronic kidney disease, stage 2 (mild): Secondary | ICD-10-CM | POA: Diagnosis not present

## 2019-04-03 DIAGNOSIS — E039 Hypothyroidism, unspecified: Secondary | ICD-10-CM | POA: Diagnosis not present

## 2019-04-03 DIAGNOSIS — K861 Other chronic pancreatitis: Secondary | ICD-10-CM | POA: Diagnosis not present

## 2019-04-17 ENCOUNTER — Ambulatory Visit: Payer: Medicare Other | Admitting: Family Medicine

## 2019-05-27 ENCOUNTER — Encounter: Payer: Self-pay | Admitting: Family Medicine

## 2019-05-27 ENCOUNTER — Other Ambulatory Visit: Payer: Self-pay

## 2019-05-27 ENCOUNTER — Ambulatory Visit (INDEPENDENT_AMBULATORY_CARE_PROVIDER_SITE_OTHER): Payer: Medicare Other | Admitting: Family Medicine

## 2019-05-27 VITALS — BP 170/80 | HR 86 | Temp 97.7°F | Ht 66.5 in | Wt 252.0 lb

## 2019-05-27 DIAGNOSIS — E785 Hyperlipidemia, unspecified: Secondary | ICD-10-CM

## 2019-05-27 DIAGNOSIS — E039 Hypothyroidism, unspecified: Secondary | ICD-10-CM | POA: Diagnosis not present

## 2019-05-27 DIAGNOSIS — N1831 Chronic kidney disease, stage 3a: Secondary | ICD-10-CM

## 2019-05-27 DIAGNOSIS — K219 Gastro-esophageal reflux disease without esophagitis: Secondary | ICD-10-CM | POA: Diagnosis not present

## 2019-05-27 DIAGNOSIS — I10 Essential (primary) hypertension: Secondary | ICD-10-CM

## 2019-05-27 NOTE — Patient Instructions (Addendum)
Blood work-non fasting  Mammogram/DEXA-10/2019

## 2019-05-27 NOTE — Progress Notes (Addendum)
New Patient Office Visit  Subjective:  Patient ID: Deanna [name], female    DOB: 07-Jan-1951  Age: 69 y.o. MRN: [id number] Findings: colonoscopy 2019-5 year f/u      The perianal and digital rectal examinations were normal.      Three polyps were found in the splenic flexure, ascending colon and       cecum. The polyps were 4 to 6 mm in size. These polyps were removed with       a cold snare. Resection and retrieval were complete. The pathology       specimen was placed into Bottle Number 1.      A 6 to 10 mm polyp was found in the cecum. The polyp was removed with a       hot snare. Resection and retrieval were complete. The pathology specimen       was placed into Bottle Number 2.      A few medium-mouthed diverticula were found in the sigmoid colon.      Internal hemorrhoids were found during retroflexion. The hemorrhoids       were small. Impression:               - Three 4 to 6 mm polyps at the splenic flexure, in                            the ascending colon and in the cecum, removed with                            a cold snare. Resected and retrieved.                           - One 6 to 10 mm polyp in the cecum, removed with a                            hot snare. Resected and retrieved.                           - Diverticulosis in the sigmoid colon.                           - Internal hemorrhoids. CC:  Chief Complaint  Patient presents with  . Establish Care  . Hypertension    HPI Deanna [name] presents for HTN-amlodipine 10mg -blood pressure readings at home 120-130/70-80-  Past Medical History:  Diagnosis Date  . Chronic kidney disease    pt. reports that during episode of pancreatitis, she experienced decreased kidney function related to contrast dye   . GERD (gastroesophageal reflux disease)   . History of hiatal hernia   . HTN (hypertension)   . Hx of pancreatitis 2015   attributes to Etodolac and Zpack  . Kidney disease, chronic, stage III (GFR 30-59  ml/min) 05/19/2015  . Primary localized osteoarthritis of left knee   . Primary localized osteoarthritis of right knee   . Reflux     Past Surgical History:  Procedure Laterality Date  . COLONOSCOPY    . COLONOSCOPY N/A 08/23/2017   Procedure: COLONOSCOPY;  Surgeon: Rogene Houston, MD;  Location: [facility];  Service: Endoscopy;  Laterality: N/A;  200  . DIAGNOSTIC LAPAROSCOPY    .  EUS N/A 08/15/2013   Procedure: UPPER ENDOSCOPIC ULTRASOUND (EUS) LINEAR;  Surgeon: Milus Banister, MD;  Location: [facility];  Service: Endoscopy;  Laterality: N/A;  . FOOT SURGERY    . FOOT SURGERY Bilateral    bunions  . JOINT REPLACEMENT Bilateral    knees  . KNEE ARTHROSCOPY W/ MENISCAL REPAIR Left   . LAPAROSCOPIC ENDOMETRIOSIS FULGURATION    . pancreatitis     caused by Etodalac  . POLYPECTOMY  08/23/2017   Procedure: POLYPECTOMY;  Surgeon: Rogene Houston, MD;  Location: [facility];  Service: Endoscopy;;  cecal (HSx1, CSx1), ascending colon (CS x1), splenic flexure (CS x1)  . TOTAL KNEE ARTHROPLASTY Right 05/18/2015   Procedure: TOTAL KNEE ARTHROPLASTY; RIGHT KNEE;  Surgeon: Elsie Saas, MD;  Location: [facility];  Service: Orthopedics;  Laterality: Right;  . TOTAL KNEE ARTHROPLASTY Left 11/16/2015  . TOTAL KNEE ARTHROPLASTY Left 11/16/2015   Procedure: LEFT TOTAL KNEE ARTHROPLASTY;  Surgeon: Elsie Saas, MD;  Location: [facility];  Service: Orthopedics;  Laterality: Left;  . trigger thumb     rt thumb last year    Family History  Problem Relation Age of Onset  . Diabetes Father   . Hypertension Father   . Aneurysm Mother   . Cancer - Other Sister     Social History   Socioeconomic History  . Marital status: Married    Spouse name: Not on file  . Number of children: Not on file  . Years of education: 20  . Highest education level: Not on file  Occupational History  . Occupation: retired  Tobacco Use  . Smoking status: Never Smoker  . Smokeless tobacco: Never Used  Substance  and Sexual Activity  . Alcohol use: No  . Drug use: No  . Sexual activity: Not Currently  Other Topics Concern  . Not on file  Social History Narrative  . Not on file   Social Determinants of Health   Financial Resource Strain:   . Difficulty of Paying Living Expenses: Not on file  Food Insecurity:   . Worried About Charity fundraiser in the Last Year: Not on file  . Ran Out of Food in the Last Year: Not on file  Transportation Needs:   . Lack of Transportation (Medical): Not on file  . Lack of Transportation (Non-Medical): Not on file  Physical Activity:   . Days of Exercise per Week: Not on file  . Minutes of Exercise per Session: Not on file  Stress:   . Feeling of Stress : Not on file  Social Connections:   . Frequency of Communication with Friends and Family: Not on file  . Frequency of Social Gatherings with Friends and Family: Not on file  . Attends Religious Services: Not on file  . Active Member of Clubs or Organizations: Not on file  . Attends Archivist Meetings: Not on file  . Marital Status: Not on file  Intimate Partner Violence:   . Fear of Current or Ex-Partner: Not on file  . Emotionally Abused: Not on file  . Physically Abused: Not on file  . Sexually Abused: Not on file    ROS Review of Systems  Constitutional: Negative.   HENT: Negative.   Eyes:       Glasses  Respiratory: Negative.   Cardiovascular: Negative.   Gastrointestinal: Positive for constipation.       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        Bilat knee replacement  Skin: Negative.   Allergic/Immunologic: Negative.   Neurological: Negative.   Hematological:       Leukocytosis  Psychiatric/Behavioral: Negative.     Objective:   Today's Vitals: BP (!) 170/80 (BP Location: Left Arm, Patient Position: Sitting, Cuff Size: Normal)   Pulse 86    Temp 97.7 F (36.5 C) (Temporal)   Ht 5' 6.5" (1.689 m)   Wt 252 lb (114.3 kg)   SpO2 99%   BMI 40.06 kg/m   Physical Exam Constitutional:      Appearance: Normal appearance.  HENT:     Head: Normocephalic and atraumatic.  Cardiovascular:     Rate and Rhythm: Normal rate and regular rhythm.     Pulses: Normal pulses.     Heart sounds: Normal heart sounds.  Pulmonary:     Effort: Pulmonary effort is normal.     Breath sounds: Normal breath sounds.  Neurological:     Mental Status: She is alert.  Psychiatric:        Mood and Affect: Mood normal.        Behavior: Behavior normal.     Assessment & Plan:   1. Gastroesophageal reflux disease, unspecified whether esophagitis present - CBC w/Diff/Platelet pepcid-stable, Dexilant-stable 2. Hypertension, unspecified type Amlodipine-stable - COMPLETE METABOLIC PANEL WITH GFR - Urinalysis  3. Stage 3a chronic kidney disease Reviewed labwork  4. Hyperlipidemia, unspecified hyperlipidemia type Atorvastatin-lipid panel wnl, lft wnl  5. Hypothyroidism, unspecified type Levothyroid-4.69 TSH Outpatient Encounter Medications as of 05/27/2019  Medication Sig  . amLODipine (NORVASC) 10 MG tablet Take 10 mg by mouth daily.  . Ascorbic Acid (VITAMIN C) 1000 MG tablet Take 1,000 mg by mouth daily.  Marland Kitchen atorvastatin (LIPITOR) 20 MG tablet Take 20 mg by mouth daily after breakfast.   . Coenzyme Q10 (COQ10) 200 MG CAPS Take 200 mg by mouth daily.  Marland Kitchen dexlansoprazole (DEXILANT) 60 MG capsule Take 60 mg by mouth 2 (two) times a week.  . diphenhydrAMINE (BENADRYL) 25 MG tablet Take 25 mg by mouth at bedtime.  . docusate sodium (COLACE) 100 MG capsule 1 tab 2 times a day while on narcotics.  STOOL SOFTENER (Patient taking differently: Take 100 mg by mouth 2 (two) times daily. )  . famotidine (PEPCID) 20 MG tablet Take 20 mg by mouth 3 (three) times daily.  Javier Docker Oil 500 MG CAPS Take 500 mg by mouth daily.  Marland Kitchen levothyroxine (SYNTHROID,  LEVOTHROID) 50 MCG tablet Take 50 mcg by mouth daily before breakfast.  . magnesium oxide (MAG-OX) 400 MG tablet Take 400 mg by mouth daily.  . Multiple Vitamins-Minerals (CENTRUM SILVER 50+WOMEN PO) Take by mouth daily.  . vitamin B-12 (CYANOCOBALAMIN) 1000 MCG tablet Take 1,000 mcg by mouth daily.  Marland Kitchen zinc gluconate 50 MG tablet Take 50 mg by mouth daily.  . [DISCONTINUED] acetaminophen (TYLENOL) 500 MG tablet Take 500 mg by mouth every 6 (six) hours as needed (FOR PAIN.).   . [DISCONTINUED] Alum Hydroxide-Mag Carbonate (GAVISCON EXTRA STRENGTH) 160-105 MG CHEW Chew 1 tablet by mouth at bedtime as needed (for indigestion/heartburn (alternates between GAVISCON & TUMS)).  . [DISCONTINUED] amoxicillin (AMOXIL) 500 MG capsule Take 2,000 mg by mouth See admin instructions. TAKE 4 CAPSULES BY MOUTH 1 HOUR PRIOR TO DENTAL WORK  . [DISCONTINUED] calcium carbonate (TUMS EX) 750 MG chewable tablet Chew 1 tablet by mouth at  bedtime as needed (for indigestion/heartburn (alternates between [place])).  . [DISCONTINUED] GAVILYTE-G 236 g solution Take 4,000 mLs by mouth once. BEFORE COLONOSCOPY  . [DISCONTINUED] Multiple Vitamin (MULTIVITAMIN WITH MINERALS) TABS tablet Take 1 tablet by mouth at bedtime. Centrum Silver  . [DISCONTINUED] ranitidine (ZANTAC) 150 MG tablet Take 150 mg by mouth 3 (three) times daily before meals.  . [DISCONTINUED] Turmeric 500 MG CAPS Take 500 mg by mouth daily.   No facility-administered encounter medications on file as of 05/27/2019.    Follow-up:   Shelitha Magley Hannah Beat, MD

## 2019-05-31 DIAGNOSIS — M5442 Lumbago with sciatica, left side: Secondary | ICD-10-CM | POA: Diagnosis not present

## 2019-05-31 DIAGNOSIS — M9903 Segmental and somatic dysfunction of lumbar region: Secondary | ICD-10-CM | POA: Diagnosis not present

## 2019-05-31 DIAGNOSIS — M9905 Segmental and somatic dysfunction of pelvic region: Secondary | ICD-10-CM | POA: Diagnosis not present

## 2019-05-31 DIAGNOSIS — M9902 Segmental and somatic dysfunction of thoracic region: Secondary | ICD-10-CM | POA: Diagnosis not present

## 2019-05-31 DIAGNOSIS — M546 Pain in thoracic spine: Secondary | ICD-10-CM | POA: Diagnosis not present

## 2019-06-14 DIAGNOSIS — M9905 Segmental and somatic dysfunction of pelvic region: Secondary | ICD-10-CM | POA: Diagnosis not present

## 2019-06-14 DIAGNOSIS — M9902 Segmental and somatic dysfunction of thoracic region: Secondary | ICD-10-CM | POA: Diagnosis not present

## 2019-06-14 DIAGNOSIS — M9903 Segmental and somatic dysfunction of lumbar region: Secondary | ICD-10-CM | POA: Diagnosis not present

## 2019-06-14 DIAGNOSIS — M546 Pain in thoracic spine: Secondary | ICD-10-CM | POA: Diagnosis not present

## 2019-08-07 DIAGNOSIS — M9903 Segmental and somatic dysfunction of lumbar region: Secondary | ICD-10-CM | POA: Diagnosis not present

## 2019-08-07 DIAGNOSIS — M9905 Segmental and somatic dysfunction of pelvic region: Secondary | ICD-10-CM | POA: Diagnosis not present

## 2019-08-07 DIAGNOSIS — M546 Pain in thoracic spine: Secondary | ICD-10-CM | POA: Diagnosis not present

## 2019-08-07 DIAGNOSIS — M5442 Lumbago with sciatica, left side: Secondary | ICD-10-CM | POA: Diagnosis not present

## 2019-08-07 DIAGNOSIS — M9902 Segmental and somatic dysfunction of thoracic region: Secondary | ICD-10-CM | POA: Diagnosis not present

## 2019-09-02 DIAGNOSIS — M546 Pain in thoracic spine: Secondary | ICD-10-CM | POA: Diagnosis not present

## 2019-09-02 DIAGNOSIS — M9903 Segmental and somatic dysfunction of lumbar region: Secondary | ICD-10-CM | POA: Diagnosis not present

## 2019-09-02 DIAGNOSIS — M9905 Segmental and somatic dysfunction of pelvic region: Secondary | ICD-10-CM | POA: Diagnosis not present

## 2019-09-02 DIAGNOSIS — M9902 Segmental and somatic dysfunction of thoracic region: Secondary | ICD-10-CM | POA: Diagnosis not present

## 2019-09-13 DIAGNOSIS — R7309 Other abnormal glucose: Secondary | ICD-10-CM | POA: Diagnosis not present

## 2019-09-13 DIAGNOSIS — E7800 Pure hypercholesterolemia, unspecified: Secondary | ICD-10-CM | POA: Diagnosis not present

## 2019-09-13 DIAGNOSIS — I10 Essential (primary) hypertension: Secondary | ICD-10-CM | POA: Diagnosis not present

## 2019-09-13 DIAGNOSIS — K219 Gastro-esophageal reflux disease without esophagitis: Secondary | ICD-10-CM | POA: Diagnosis not present

## 2019-09-13 DIAGNOSIS — E039 Hypothyroidism, unspecified: Secondary | ICD-10-CM | POA: Diagnosis not present

## 2019-09-13 DIAGNOSIS — Z79899 Other long term (current) drug therapy: Secondary | ICD-10-CM | POA: Diagnosis not present

## 2019-10-02 DIAGNOSIS — M9902 Segmental and somatic dysfunction of thoracic region: Secondary | ICD-10-CM | POA: Diagnosis not present

## 2019-10-02 DIAGNOSIS — M9905 Segmental and somatic dysfunction of pelvic region: Secondary | ICD-10-CM | POA: Diagnosis not present

## 2019-10-02 DIAGNOSIS — M9903 Segmental and somatic dysfunction of lumbar region: Secondary | ICD-10-CM | POA: Diagnosis not present

## 2019-10-02 DIAGNOSIS — M546 Pain in thoracic spine: Secondary | ICD-10-CM | POA: Diagnosis not present

## 2019-10-29 ENCOUNTER — Other Ambulatory Visit (HOSPITAL_COMMUNITY): Payer: Self-pay | Admitting: Family Medicine

## 2019-10-29 DIAGNOSIS — Z1231 Encounter for screening mammogram for malignant neoplasm of breast: Secondary | ICD-10-CM

## 2019-11-20 ENCOUNTER — Ambulatory Visit (HOSPITAL_COMMUNITY)
Admission: RE | Admit: 2019-11-20 | Discharge: 2019-11-20 | Disposition: A | Discharge status: Home / Self Care | Payer: Medicare Other | Source: Ambulatory Visit | Attending: Family Medicine | Admitting: Family Medicine

## 2019-11-20 ENCOUNTER — Other Ambulatory Visit: Payer: Self-pay

## 2019-11-20 DIAGNOSIS — Z1231 Encounter for screening mammogram for malignant neoplasm of breast: Secondary | ICD-10-CM | POA: Insufficient documentation

## 2019-11-25 ENCOUNTER — Ambulatory Visit: Payer: Medicare Other | Admitting: Family Medicine

## 2019-11-25 DIAGNOSIS — E039 Hypothyroidism, unspecified: Secondary | ICD-10-CM | POA: Diagnosis not present

## 2019-12-02 DIAGNOSIS — M9903 Segmental and somatic dysfunction of lumbar region: Secondary | ICD-10-CM | POA: Diagnosis not present

## 2019-12-02 DIAGNOSIS — M9905 Segmental and somatic dysfunction of pelvic region: Secondary | ICD-10-CM | POA: Diagnosis not present

## 2019-12-02 DIAGNOSIS — M546 Pain in thoracic spine: Secondary | ICD-10-CM | POA: Diagnosis not present

## 2019-12-02 DIAGNOSIS — M9902 Segmental and somatic dysfunction of thoracic region: Secondary | ICD-10-CM | POA: Diagnosis not present

## 2020-01-01 DIAGNOSIS — M546 Pain in thoracic spine: Secondary | ICD-10-CM | POA: Diagnosis not present

## 2020-01-01 DIAGNOSIS — M9903 Segmental and somatic dysfunction of lumbar region: Secondary | ICD-10-CM | POA: Diagnosis not present

## 2020-01-01 DIAGNOSIS — M9902 Segmental and somatic dysfunction of thoracic region: Secondary | ICD-10-CM | POA: Diagnosis not present

## 2020-01-01 DIAGNOSIS — M9905 Segmental and somatic dysfunction of pelvic region: Secondary | ICD-10-CM | POA: Diagnosis not present

## 2020-02-05 DIAGNOSIS — M9903 Segmental and somatic dysfunction of lumbar region: Secondary | ICD-10-CM | POA: Diagnosis not present

## 2020-02-05 DIAGNOSIS — M9902 Segmental and somatic dysfunction of thoracic region: Secondary | ICD-10-CM | POA: Diagnosis not present

## 2020-02-05 DIAGNOSIS — M5442 Lumbago with sciatica, left side: Secondary | ICD-10-CM | POA: Diagnosis not present

## 2020-02-05 DIAGNOSIS — M546 Pain in thoracic spine: Secondary | ICD-10-CM | POA: Diagnosis not present

## 2020-02-05 DIAGNOSIS — M9905 Segmental and somatic dysfunction of pelvic region: Secondary | ICD-10-CM | POA: Diagnosis not present

## 2020-03-04 DIAGNOSIS — M546 Pain in thoracic spine: Secondary | ICD-10-CM | POA: Diagnosis not present

## 2020-03-04 DIAGNOSIS — M9905 Segmental and somatic dysfunction of pelvic region: Secondary | ICD-10-CM | POA: Diagnosis not present

## 2020-03-04 DIAGNOSIS — M9903 Segmental and somatic dysfunction of lumbar region: Secondary | ICD-10-CM | POA: Diagnosis not present

## 2020-03-04 DIAGNOSIS — M9902 Segmental and somatic dysfunction of thoracic region: Secondary | ICD-10-CM | POA: Diagnosis not present

## 2020-03-11 DIAGNOSIS — M9905 Segmental and somatic dysfunction of pelvic region: Secondary | ICD-10-CM | POA: Diagnosis not present

## 2020-03-11 DIAGNOSIS — M9903 Segmental and somatic dysfunction of lumbar region: Secondary | ICD-10-CM | POA: Diagnosis not present

## 2020-03-11 DIAGNOSIS — M9902 Segmental and somatic dysfunction of thoracic region: Secondary | ICD-10-CM | POA: Diagnosis not present

## 2020-03-11 DIAGNOSIS — M546 Pain in thoracic spine: Secondary | ICD-10-CM | POA: Diagnosis not present

## 2020-03-18 DIAGNOSIS — E7800 Pure hypercholesterolemia, unspecified: Secondary | ICD-10-CM | POA: Diagnosis not present

## 2020-03-18 DIAGNOSIS — R7301 Impaired fasting glucose: Secondary | ICD-10-CM | POA: Diagnosis not present

## 2020-03-18 DIAGNOSIS — R7309 Other abnormal glucose: Secondary | ICD-10-CM | POA: Diagnosis not present

## 2020-03-18 DIAGNOSIS — Z0001 Encounter for general adult medical examination with abnormal findings: Secondary | ICD-10-CM | POA: Diagnosis not present

## 2020-03-18 DIAGNOSIS — I10 Essential (primary) hypertension: Secondary | ICD-10-CM | POA: Diagnosis not present

## 2020-03-18 DIAGNOSIS — E2839 Other primary ovarian failure: Secondary | ICD-10-CM | POA: Diagnosis not present

## 2020-03-18 DIAGNOSIS — E039 Hypothyroidism, unspecified: Secondary | ICD-10-CM | POA: Diagnosis not present

## 2020-03-18 DIAGNOSIS — Z79899 Other long term (current) drug therapy: Secondary | ICD-10-CM | POA: Diagnosis not present

## 2020-03-18 DIAGNOSIS — K219 Gastro-esophageal reflux disease without esophagitis: Secondary | ICD-10-CM | POA: Diagnosis not present

## 2020-04-01 DIAGNOSIS — M9903 Segmental and somatic dysfunction of lumbar region: Secondary | ICD-10-CM | POA: Diagnosis not present

## 2020-04-01 DIAGNOSIS — M9905 Segmental and somatic dysfunction of pelvic region: Secondary | ICD-10-CM | POA: Diagnosis not present

## 2020-04-01 DIAGNOSIS — M9902 Segmental and somatic dysfunction of thoracic region: Secondary | ICD-10-CM | POA: Diagnosis not present

## 2020-04-01 DIAGNOSIS — M546 Pain in thoracic spine: Secondary | ICD-10-CM | POA: Diagnosis not present

## 2020-04-07 ENCOUNTER — Other Ambulatory Visit: Payer: Self-pay | Admitting: Family Medicine

## 2020-04-15 ENCOUNTER — Other Ambulatory Visit: Payer: Self-pay | Admitting: Family Medicine

## 2020-04-15 DIAGNOSIS — E2839 Other primary ovarian failure: Secondary | ICD-10-CM

## 2020-04-17 ENCOUNTER — Other Ambulatory Visit: Payer: Medicare Other

## 2020-04-28 ENCOUNTER — Other Ambulatory Visit: Payer: Self-pay | Admitting: Unknown Physician Specialty

## 2020-04-28 ENCOUNTER — Telehealth: Payer: Self-pay | Admitting: Unknown Physician Specialty

## 2020-04-28 ENCOUNTER — Ambulatory Visit (HOSPITAL_COMMUNITY)
Admission: RE | Admit: 2020-04-28 | Discharge: 2020-04-28 | Disposition: A | Discharge status: Home / Self Care | Payer: Medicare Other | Source: Ambulatory Visit | Attending: Pulmonary Disease | Admitting: Pulmonary Disease

## 2020-04-28 DIAGNOSIS — I10 Essential (primary) hypertension: Secondary | ICD-10-CM

## 2020-04-28 DIAGNOSIS — Z23 Encounter for immunization: Secondary | ICD-10-CM | POA: Diagnosis not present

## 2020-04-28 DIAGNOSIS — N1831 Chronic kidney disease, stage 3a: Secondary | ICD-10-CM | POA: Diagnosis not present

## 2020-04-28 DIAGNOSIS — U071 COVID-19: Secondary | ICD-10-CM | POA: Insufficient documentation

## 2020-04-28 MED ORDER — FAMOTIDINE IN NACL 20-0.9 MG/50ML-% IV SOLN: 20.0000 mg | Freq: Once | INTRAVENOUS | Status: DC | PRN

## 2020-04-28 MED ORDER — DIPHENHYDRAMINE HCL 50 MG/ML IJ SOLN: 50.0000 mg | Freq: Once | INTRAMUSCULAR | Status: DC | PRN

## 2020-04-28 MED ORDER — SODIUM CHLORIDE 0.9 % IV SOLN: INTRAVENOUS | Status: DC | PRN

## 2020-04-28 MED ORDER — ALBUTEROL SULFATE HFA 108 (90 BASE) MCG/ACT IN AERS: 2.0000 | INHALATION_SPRAY | Freq: Once | RESPIRATORY_TRACT | Status: DC | PRN

## 2020-04-28 MED ORDER — METHYLPREDNISOLONE SODIUM SUCC 125 MG IJ SOLR: 125.0000 mg | Freq: Once | INTRAMUSCULAR | Status: DC | PRN

## 2020-04-28 MED ORDER — SODIUM CHLORIDE 0.9 % IV SOLN: Freq: Once | INTRAVENOUS | Status: AC

## 2020-04-28 MED ORDER — EPINEPHRINE 0.3 MG/0.3ML IJ SOAJ: 0.3000 mg | Freq: Once | INTRAMUSCULAR | Status: DC | PRN

## 2020-04-28 NOTE — Progress Notes (Signed)
  Diagnosis: COVID-19  Physician: Dr. Wright  Procedure: Cov[facility] Infusion Clinic Med: casirivimab\imdevimab infusion - Provided patient with casirivimab\imdevimab fact sheet for patients, parents and caregivers prior to infusion.  Complications: No immediate complications noted.  Discharge: Discharged home   Amarie Tarte M Breannah Kratt 04/28/2020  

## 2020-04-28 NOTE — Telephone Encounter (Signed)
I connected by phone with Deanna [name] on 04/28/2020 at 10:45 AM to discuss the potential use of a new treatment for mild to moderate COVID-19 viral infection in non-hospitalized patients.  This patient is a 69 y.o. female that meets the FDA criteria for Emergency Use Authorization of COVID monoclonal antibody casirivimab/imdevimab, bamlanivimab/etesevimab, or sotrovimab.  Has a (+) direct SARS-CoV-2 viral test result  Has mild or moderate COVID-19   Is NOT hospitalized due to COVID-19  Is within 10 days of symptom onset  Has at least one of the high risk factor(s) for progression to severe COVID-19 and/or hospitalization as defined in EUA.  Specific high risk criteria : Older age (>/= 69 yo), Chronic Kidney Disease (CKD) and Cardiovascular disease or hypertension   I have spoken and communicated the following to the patient or parent/caregiver regarding COVID monoclonal antibody treatment:  1. FDA has authorized the emergency use for the treatment of mild to moderate COVID-19 in adults and pediatric patients with positive results of direct SARS-CoV-2 viral testing who are 66 years of age and older weighing at least 40 kg, and who are at high risk for progressing to severe COVID-19 and/or hospitalization.  2. The significant known and potential risks and benefits of COVID monoclonal antibody, and the extent to which such potential risks and benefits are unknown.  3. Information on available alternative treatments and the risks and benefits of those alternatives, including clinical trials.  4. Patients treated with COVID monoclonal antibody should continue to self-isolate and use infection control measures (e.g., wear mask, isolate, social distance, avoid sharing personal items, clean and disinfect "high touch" surfaces, and frequent handwashing) according to CDC guidelines.   5. The patient or parent/caregiver has the option to accept or refuse COVID monoclonal antibody  treatment.  After reviewing this information with the patient, the patient has agreed to receive one of the available covid 19 monoclonal antibodies and will be provided an appropriate fact sheet prior to infusion. Gabriel Cirri, NP 04/28/2020 10:45 AM  Sx onset 12/24. Vaccinated and willing to be on waitlist today and tomorrow.

## 2020-04-28 NOTE — Progress Notes (Signed)
I connected by phone with Deanna [name] on 04/28/2020 at 3:58 PM to discuss the potential use of a new treatment for mild to moderate COVID-19 viral infection in non-hospitalized patients.  This patient is a 69 y.o. female that meets the FDA criteria for Emergency Use Authorization of COVID monoclonal antibody casirivimab/imdevimab, bamlanivimab/etesevimab, or sotrovimab.  Has a (+) direct SARS-CoV-2 viral test result  Has mild or moderate COVID-19   Is NOT hospitalized due to COVID-19  Is within 10 days of symptom onset  Has at least one of the high risk factor(s) for progression to severe COVID-19 and/or hospitalization as defined in EUA.  Specific high risk criteria : BMI > 25, Chronic Kidney Disease (CKD) and Cardiovascular disease or hypertension   I have spoken and communicated the following to the patient or parent/caregiver regarding COVID monoclonal antibody treatment:  1. FDA has authorized the emergency use for the treatment of mild to moderate COVID-19 in adults and pediatric patients with positive results of direct SARS-CoV-2 viral testing who are 41 years of age and older weighing at least 40 kg, and who are at high risk for progressing to severe COVID-19 and/or hospitalization.  2. The significant known and potential risks and benefits of COVID monoclonal antibody, and the extent to which such potential risks and benefits are unknown.  3. Information on available alternative treatments and the risks and benefits of those alternatives, including clinical trials.  4. Patients treated with COVID monoclonal antibody should continue to self-isolate and use infection control measures (e.g., wear mask, isolate, social distance, avoid sharing personal items, clean and disinfect high touch surfaces, and frequent handwashing) according to CDC guidelines.   5. The patient or parent/caregiver has the option to accept or refuse COVID monoclonal antibody treatment.  After reviewing  this information with the patient, the patient has agreed to receive one of the available covid 19 monoclonal antibodies and will be provided an appropriate fact sheet prior to infusion. Gabriel Cirri, NP 04/28/2020 3:58 PM

## 2020-04-28 NOTE — Progress Notes (Signed)
Patient reviewed Fact Sheet for Patients, Parents, and Caregivers for Emergency Use Authorization (EUA) of Casi and Imedevimab for the Treatment of Coronavirus. Patient also reviewed and is agreeable to the estimated cost of treatment. Patient is agreeable to proceed.

## 2020-04-28 NOTE — Progress Notes (Signed)
Pt advised of vital signs pre infusion and post infusion.  Pt states high BP readings are normal for her when going to the doctor r/t white coat syndrome.  She takes her BP at home leading up to doctor appointments and provides readings to provider.  She states no current issues with vision (flashes, floaters, blurred vision, etc), heart, and no HA.  Pt states she did not take her BP before coming today.  I reassessed her BP and it was 160/70 pre-infusion and 158/93 post infusion. Pt states she did feel most comfortable with me getting vitals.

## 2020-04-28 NOTE — Discharge Instructions (Signed)
10 Things You Can Do to Manage Your COVID-19 Symptoms at Home If you have possible or confirmed COVID-19: 1. Stay home from work and school. And stay away from other public places. If you must go out, avoid using any kind of public transportation, ridesharing, or taxis. 2. Monitor your symptoms carefully. If your symptoms get worse, call your healthcare provider immediately. 3. Get rest and stay hydrated. 4. If you have a medical appointment, call the healthcare provider ahead of time and tell them that you have or may have COVID-19. 5. For medical emergencies, call 911 and notify the dispatch personnel that you have or may have COVID-19. 6. Cover your cough and sneezes with a tissue or use the inside of your elbow. 7. Wash your hands often with soap and water for at least 20 seconds or clean your hands with an alcohol-based hand sanitizer that contains at least 60% alcohol. 8. As much as possible, stay in a specific room and away from other people in your home. Also, you should use a separate bathroom, if available. If you need to be around other people in or outside of the home, wear a mask. 9. Avoid sharing personal items with other people in your household, like dishes, towels, and bedding. 10. Clean all surfaces that are touched often, like counters, tabletops, and doorknobs. Use household cleaning sprays or wipes according to the label instructions. cdc.gov/coronavirus 10/31/2018 This information is not intended to replace advice given to you by your health care provider. Make sure you discuss any questions you have with your health care provider. Document Revised: 04/04/2019 Document Reviewed: 04/04/2019 Elsevier Patient Education  2020 Elsevier Inc. What types of side effects do monoclonal antibody drugs cause?  Common side effects  In general, the more common side effects caused by monoclonal antibody drugs include: . Allergic reactions, such as hives or itching . Flu-like signs and  symptoms, including chills, fatigue, fever, and muscle aches and pains . Nausea, vomiting . Diarrhea . Skin rashes . Low blood pressure   The CDC is recommending patients who receive monoclonal antibody treatments wait at least 90 days before being vaccinated.  Currently, there are no data on the safety and efficacy of mRNA COVID-19 vaccines in persons who received monoclonal antibodies or convalescent plasma as part of COVID-19 treatment. Based on the estimated half-life of such therapies as well as evidence suggesting that reinfection is uncommon in the 90 days after initial infection, vaccination should be deferred for at least 90 days, as a precautionary measure until additional information becomes available, to avoid interference of the antibody treatment with vaccine-induced immune responses. If you have any questions or concerns after the infusion please call the Advanced Practice Provider on call at [phone]. This number is ONLY intended for your use regarding questions or concerns about the infusion post-treatment side-effects.  Please do not provide this number to others for use. For return to work notes please contact your primary care provider.   If someone you know is interested in receiving treatment please have them call the COVID hotline at [phone].   

## 2020-06-03 DIAGNOSIS — M9905 Segmental and somatic dysfunction of pelvic region: Secondary | ICD-10-CM | POA: Diagnosis not present

## 2020-06-03 DIAGNOSIS — M546 Pain in thoracic spine: Secondary | ICD-10-CM | POA: Diagnosis not present

## 2020-06-03 DIAGNOSIS — M5441 Lumbago with sciatica, right side: Secondary | ICD-10-CM | POA: Diagnosis not present

## 2020-06-03 DIAGNOSIS — M9902 Segmental and somatic dysfunction of thoracic region: Secondary | ICD-10-CM | POA: Diagnosis not present

## 2020-06-03 DIAGNOSIS — M9903 Segmental and somatic dysfunction of lumbar region: Secondary | ICD-10-CM | POA: Diagnosis not present

## 2020-07-01 DIAGNOSIS — M9905 Segmental and somatic dysfunction of pelvic region: Secondary | ICD-10-CM | POA: Diagnosis not present

## 2020-07-01 DIAGNOSIS — M9903 Segmental and somatic dysfunction of lumbar region: Secondary | ICD-10-CM | POA: Diagnosis not present

## 2020-07-01 DIAGNOSIS — M546 Pain in thoracic spine: Secondary | ICD-10-CM | POA: Diagnosis not present

## 2020-07-01 DIAGNOSIS — M5441 Lumbago with sciatica, right side: Secondary | ICD-10-CM | POA: Diagnosis not present

## 2020-07-01 DIAGNOSIS — M9902 Segmental and somatic dysfunction of thoracic region: Secondary | ICD-10-CM | POA: Diagnosis not present

## 2020-07-23 ENCOUNTER — Other Ambulatory Visit: Payer: Self-pay

## 2020-07-23 ENCOUNTER — Ambulatory Visit
Admission: RE | Admit: 2020-07-23 | Discharge: 2020-07-23 | Disposition: A | Discharge status: Home / Self Care | Payer: PPO | Source: Ambulatory Visit | Attending: Family Medicine | Admitting: Family Medicine

## 2020-07-23 DIAGNOSIS — Z780 Asymptomatic menopausal state: Secondary | ICD-10-CM | POA: Diagnosis not present

## 2020-07-23 DIAGNOSIS — E2839 Other primary ovarian failure: Secondary | ICD-10-CM

## 2020-09-02 DIAGNOSIS — M9902 Segmental and somatic dysfunction of thoracic region: Secondary | ICD-10-CM | POA: Diagnosis not present

## 2020-09-02 DIAGNOSIS — M5442 Lumbago with sciatica, left side: Secondary | ICD-10-CM | POA: Diagnosis not present

## 2020-09-02 DIAGNOSIS — M546 Pain in thoracic spine: Secondary | ICD-10-CM | POA: Diagnosis not present

## 2020-09-02 DIAGNOSIS — M9905 Segmental and somatic dysfunction of pelvic region: Secondary | ICD-10-CM | POA: Diagnosis not present

## 2020-09-02 DIAGNOSIS — M9903 Segmental and somatic dysfunction of lumbar region: Secondary | ICD-10-CM | POA: Diagnosis not present

## 2020-09-16 DIAGNOSIS — I10 Essential (primary) hypertension: Secondary | ICD-10-CM | POA: Diagnosis not present

## 2020-09-16 DIAGNOSIS — R7309 Other abnormal glucose: Secondary | ICD-10-CM | POA: Diagnosis not present

## 2020-09-16 DIAGNOSIS — Z79899 Other long term (current) drug therapy: Secondary | ICD-10-CM | POA: Diagnosis not present

## 2020-09-16 DIAGNOSIS — E7800 Pure hypercholesterolemia, unspecified: Secondary | ICD-10-CM | POA: Diagnosis not present

## 2020-09-16 DIAGNOSIS — K219 Gastro-esophageal reflux disease without esophagitis: Secondary | ICD-10-CM | POA: Diagnosis not present

## 2020-09-16 DIAGNOSIS — E039 Hypothyroidism, unspecified: Secondary | ICD-10-CM | POA: Diagnosis not present

## 2020-09-30 DIAGNOSIS — M9903 Segmental and somatic dysfunction of lumbar region: Secondary | ICD-10-CM | POA: Diagnosis not present

## 2020-09-30 DIAGNOSIS — M9902 Segmental and somatic dysfunction of thoracic region: Secondary | ICD-10-CM | POA: Diagnosis not present

## 2020-09-30 DIAGNOSIS — M546 Pain in thoracic spine: Secondary | ICD-10-CM | POA: Diagnosis not present

## 2020-09-30 DIAGNOSIS — M5442 Lumbago with sciatica, left side: Secondary | ICD-10-CM | POA: Diagnosis not present

## 2020-09-30 DIAGNOSIS — M9905 Segmental and somatic dysfunction of pelvic region: Secondary | ICD-10-CM | POA: Diagnosis not present

## 2020-10-15 DIAGNOSIS — M9903 Segmental and somatic dysfunction of lumbar region: Secondary | ICD-10-CM | POA: Diagnosis not present

## 2020-10-15 DIAGNOSIS — M62830 Muscle spasm of back: Secondary | ICD-10-CM | POA: Diagnosis not present

## 2020-10-15 DIAGNOSIS — M9902 Segmental and somatic dysfunction of thoracic region: Secondary | ICD-10-CM | POA: Diagnosis not present

## 2020-10-15 DIAGNOSIS — M9905 Segmental and somatic dysfunction of pelvic region: Secondary | ICD-10-CM | POA: Diagnosis not present

## 2020-10-15 DIAGNOSIS — M546 Pain in thoracic spine: Secondary | ICD-10-CM | POA: Diagnosis not present

## 2020-10-20 ENCOUNTER — Other Ambulatory Visit (HOSPITAL_COMMUNITY): Payer: Self-pay | Admitting: Family Medicine

## 2020-10-20 DIAGNOSIS — Z1231 Encounter for screening mammogram for malignant neoplasm of breast: Secondary | ICD-10-CM

## 2020-11-04 DIAGNOSIS — M9905 Segmental and somatic dysfunction of pelvic region: Secondary | ICD-10-CM | POA: Diagnosis not present

## 2020-11-04 DIAGNOSIS — M9902 Segmental and somatic dysfunction of thoracic region: Secondary | ICD-10-CM | POA: Diagnosis not present

## 2020-11-04 DIAGNOSIS — M546 Pain in thoracic spine: Secondary | ICD-10-CM | POA: Diagnosis not present

## 2020-11-04 DIAGNOSIS — M62830 Muscle spasm of back: Secondary | ICD-10-CM | POA: Diagnosis not present

## 2020-11-04 DIAGNOSIS — M9903 Segmental and somatic dysfunction of lumbar region: Secondary | ICD-10-CM | POA: Diagnosis not present

## 2020-11-23 ENCOUNTER — Other Ambulatory Visit: Payer: Self-pay

## 2020-11-23 ENCOUNTER — Ambulatory Visit (HOSPITAL_COMMUNITY)
Admission: RE | Admit: 2020-11-23 | Discharge: 2020-11-23 | Disposition: A | Discharge status: Home / Self Care | Payer: PPO | Source: Ambulatory Visit | Attending: Family Medicine | Admitting: Family Medicine

## 2020-11-23 DIAGNOSIS — Z1231 Encounter for screening mammogram for malignant neoplasm of breast: Secondary | ICD-10-CM | POA: Diagnosis not present

## 2020-12-02 DIAGNOSIS — M9902 Segmental and somatic dysfunction of thoracic region: Secondary | ICD-10-CM | POA: Diagnosis not present

## 2020-12-02 DIAGNOSIS — M9905 Segmental and somatic dysfunction of pelvic region: Secondary | ICD-10-CM | POA: Diagnosis not present

## 2020-12-02 DIAGNOSIS — M9903 Segmental and somatic dysfunction of lumbar region: Secondary | ICD-10-CM | POA: Diagnosis not present

## 2020-12-02 DIAGNOSIS — M546 Pain in thoracic spine: Secondary | ICD-10-CM | POA: Diagnosis not present

## 2021-01-06 DIAGNOSIS — M9902 Segmental and somatic dysfunction of thoracic region: Secondary | ICD-10-CM | POA: Diagnosis not present

## 2021-01-06 DIAGNOSIS — M9905 Segmental and somatic dysfunction of pelvic region: Secondary | ICD-10-CM | POA: Diagnosis not present

## 2021-01-06 DIAGNOSIS — M546 Pain in thoracic spine: Secondary | ICD-10-CM | POA: Diagnosis not present

## 2021-01-06 DIAGNOSIS — M9903 Segmental and somatic dysfunction of lumbar region: Secondary | ICD-10-CM | POA: Diagnosis not present

## 2021-02-03 DIAGNOSIS — M9903 Segmental and somatic dysfunction of lumbar region: Secondary | ICD-10-CM | POA: Diagnosis not present

## 2021-02-03 DIAGNOSIS — M9905 Segmental and somatic dysfunction of pelvic region: Secondary | ICD-10-CM | POA: Diagnosis not present

## 2021-02-03 DIAGNOSIS — M9902 Segmental and somatic dysfunction of thoracic region: Secondary | ICD-10-CM | POA: Diagnosis not present

## 2021-02-03 DIAGNOSIS — M546 Pain in thoracic spine: Secondary | ICD-10-CM | POA: Diagnosis not present

## 2021-03-03 DIAGNOSIS — M9905 Segmental and somatic dysfunction of pelvic region: Secondary | ICD-10-CM | POA: Diagnosis not present

## 2021-03-03 DIAGNOSIS — M9902 Segmental and somatic dysfunction of thoracic region: Secondary | ICD-10-CM | POA: Diagnosis not present

## 2021-03-03 DIAGNOSIS — M546 Pain in thoracic spine: Secondary | ICD-10-CM | POA: Diagnosis not present

## 2021-03-03 DIAGNOSIS — M9903 Segmental and somatic dysfunction of lumbar region: Secondary | ICD-10-CM | POA: Diagnosis not present

## 2021-03-10 DIAGNOSIS — M542 Cervicalgia: Secondary | ICD-10-CM | POA: Diagnosis not present

## 2021-03-10 DIAGNOSIS — M546 Pain in thoracic spine: Secondary | ICD-10-CM | POA: Diagnosis not present

## 2021-03-10 DIAGNOSIS — M9902 Segmental and somatic dysfunction of thoracic region: Secondary | ICD-10-CM | POA: Diagnosis not present

## 2021-03-10 DIAGNOSIS — M9903 Segmental and somatic dysfunction of lumbar region: Secondary | ICD-10-CM | POA: Diagnosis not present

## 2021-03-10 DIAGNOSIS — M9901 Segmental and somatic dysfunction of cervical region: Secondary | ICD-10-CM | POA: Diagnosis not present

## 2021-03-10 DIAGNOSIS — M9905 Segmental and somatic dysfunction of pelvic region: Secondary | ICD-10-CM | POA: Diagnosis not present

## 2021-03-15 ENCOUNTER — Other Ambulatory Visit: Payer: Self-pay | Admitting: General Surgery

## 2021-03-15 DIAGNOSIS — K8689 Other specified diseases of pancreas: Secondary | ICD-10-CM

## 2021-03-30 DIAGNOSIS — R7309 Other abnormal glucose: Secondary | ICD-10-CM | POA: Diagnosis not present

## 2021-03-30 DIAGNOSIS — E7800 Pure hypercholesterolemia, unspecified: Secondary | ICD-10-CM | POA: Diagnosis not present

## 2021-03-30 DIAGNOSIS — I10 Essential (primary) hypertension: Secondary | ICD-10-CM | POA: Diagnosis not present

## 2021-03-30 DIAGNOSIS — Z23 Encounter for immunization: Secondary | ICD-10-CM | POA: Diagnosis not present

## 2021-03-30 DIAGNOSIS — K219 Gastro-esophageal reflux disease without esophagitis: Secondary | ICD-10-CM | POA: Diagnosis not present

## 2021-03-30 DIAGNOSIS — N1831 Chronic kidney disease, stage 3a: Secondary | ICD-10-CM | POA: Diagnosis not present

## 2021-03-30 DIAGNOSIS — E2839 Other primary ovarian failure: Secondary | ICD-10-CM | POA: Diagnosis not present

## 2021-03-30 DIAGNOSIS — Z0001 Encounter for general adult medical examination with abnormal findings: Secondary | ICD-10-CM | POA: Diagnosis not present

## 2021-03-30 DIAGNOSIS — E039 Hypothyroidism, unspecified: Secondary | ICD-10-CM | POA: Diagnosis not present

## 2021-03-30 DIAGNOSIS — Z79899 Other long term (current) drug therapy: Secondary | ICD-10-CM | POA: Diagnosis not present

## 2021-04-07 DIAGNOSIS — M9905 Segmental and somatic dysfunction of pelvic region: Secondary | ICD-10-CM | POA: Diagnosis not present

## 2021-04-07 DIAGNOSIS — M9901 Segmental and somatic dysfunction of cervical region: Secondary | ICD-10-CM | POA: Diagnosis not present

## 2021-04-07 DIAGNOSIS — M9902 Segmental and somatic dysfunction of thoracic region: Secondary | ICD-10-CM | POA: Diagnosis not present

## 2021-04-07 DIAGNOSIS — M546 Pain in thoracic spine: Secondary | ICD-10-CM | POA: Diagnosis not present

## 2021-04-07 DIAGNOSIS — M9903 Segmental and somatic dysfunction of lumbar region: Secondary | ICD-10-CM | POA: Diagnosis not present

## 2021-04-07 DIAGNOSIS — M542 Cervicalgia: Secondary | ICD-10-CM | POA: Diagnosis not present

## 2021-04-08 ENCOUNTER — Other Ambulatory Visit: Payer: Self-pay | Admitting: General Surgery

## 2021-04-08 ENCOUNTER — Other Ambulatory Visit: Payer: Self-pay

## 2021-04-08 ENCOUNTER — Ambulatory Visit
Admission: RE | Admit: 2021-04-08 | Discharge: 2021-04-08 | Disposition: A | Discharge status: Home / Self Care | Payer: PPO | Source: Ambulatory Visit | Attending: General Surgery | Admitting: General Surgery

## 2021-04-08 DIAGNOSIS — K449 Diaphragmatic hernia without obstruction or gangrene: Secondary | ICD-10-CM | POA: Diagnosis not present

## 2021-04-08 DIAGNOSIS — K8689 Other specified diseases of pancreas: Secondary | ICD-10-CM

## 2021-04-08 DIAGNOSIS — K5730 Diverticulosis of large intestine without perforation or abscess without bleeding: Secondary | ICD-10-CM | POA: Diagnosis not present

## 2021-04-08 DIAGNOSIS — N281 Cyst of kidney, acquired: Secondary | ICD-10-CM | POA: Diagnosis not present

## 2021-04-08 DIAGNOSIS — K862 Cyst of pancreas: Secondary | ICD-10-CM | POA: Diagnosis not present

## 2021-05-05 DIAGNOSIS — M546 Pain in thoracic spine: Secondary | ICD-10-CM | POA: Diagnosis not present

## 2021-05-05 DIAGNOSIS — M9905 Segmental and somatic dysfunction of pelvic region: Secondary | ICD-10-CM | POA: Diagnosis not present

## 2021-05-05 DIAGNOSIS — M9902 Segmental and somatic dysfunction of thoracic region: Secondary | ICD-10-CM | POA: Diagnosis not present

## 2021-05-05 DIAGNOSIS — M542 Cervicalgia: Secondary | ICD-10-CM | POA: Diagnosis not present

## 2021-05-05 DIAGNOSIS — M9903 Segmental and somatic dysfunction of lumbar region: Secondary | ICD-10-CM | POA: Diagnosis not present

## 2021-05-05 DIAGNOSIS — M9901 Segmental and somatic dysfunction of cervical region: Secondary | ICD-10-CM | POA: Diagnosis not present

## 2021-06-02 DIAGNOSIS — M9902 Segmental and somatic dysfunction of thoracic region: Secondary | ICD-10-CM | POA: Diagnosis not present

## 2021-06-02 DIAGNOSIS — M9903 Segmental and somatic dysfunction of lumbar region: Secondary | ICD-10-CM | POA: Diagnosis not present

## 2021-06-02 DIAGNOSIS — M9901 Segmental and somatic dysfunction of cervical region: Secondary | ICD-10-CM | POA: Diagnosis not present

## 2021-06-02 DIAGNOSIS — M9905 Segmental and somatic dysfunction of pelvic region: Secondary | ICD-10-CM | POA: Diagnosis not present

## 2021-06-02 DIAGNOSIS — M542 Cervicalgia: Secondary | ICD-10-CM | POA: Diagnosis not present

## 2021-06-02 DIAGNOSIS — M546 Pain in thoracic spine: Secondary | ICD-10-CM | POA: Diagnosis not present

## 2021-06-30 DIAGNOSIS — M542 Cervicalgia: Secondary | ICD-10-CM | POA: Diagnosis not present

## 2021-06-30 DIAGNOSIS — M9902 Segmental and somatic dysfunction of thoracic region: Secondary | ICD-10-CM | POA: Diagnosis not present

## 2021-06-30 DIAGNOSIS — M9901 Segmental and somatic dysfunction of cervical region: Secondary | ICD-10-CM | POA: Diagnosis not present

## 2021-06-30 DIAGNOSIS — M546 Pain in thoracic spine: Secondary | ICD-10-CM | POA: Diagnosis not present

## 2021-06-30 DIAGNOSIS — M9905 Segmental and somatic dysfunction of pelvic region: Secondary | ICD-10-CM | POA: Diagnosis not present

## 2021-06-30 DIAGNOSIS — M9903 Segmental and somatic dysfunction of lumbar region: Secondary | ICD-10-CM | POA: Diagnosis not present

## 2021-08-04 DIAGNOSIS — M542 Cervicalgia: Secondary | ICD-10-CM | POA: Diagnosis not present

## 2021-08-04 DIAGNOSIS — M9901 Segmental and somatic dysfunction of cervical region: Secondary | ICD-10-CM | POA: Diagnosis not present

## 2021-08-04 DIAGNOSIS — M546 Pain in thoracic spine: Secondary | ICD-10-CM | POA: Diagnosis not present

## 2021-08-04 DIAGNOSIS — M9903 Segmental and somatic dysfunction of lumbar region: Secondary | ICD-10-CM | POA: Diagnosis not present

## 2021-08-04 DIAGNOSIS — M9902 Segmental and somatic dysfunction of thoracic region: Secondary | ICD-10-CM | POA: Diagnosis not present

## 2021-08-04 DIAGNOSIS — M9905 Segmental and somatic dysfunction of pelvic region: Secondary | ICD-10-CM | POA: Diagnosis not present

## 2021-09-01 DIAGNOSIS — M546 Pain in thoracic spine: Secondary | ICD-10-CM | POA: Diagnosis not present

## 2021-09-01 DIAGNOSIS — M9903 Segmental and somatic dysfunction of lumbar region: Secondary | ICD-10-CM | POA: Diagnosis not present

## 2021-09-01 DIAGNOSIS — M9902 Segmental and somatic dysfunction of thoracic region: Secondary | ICD-10-CM | POA: Diagnosis not present

## 2021-09-01 DIAGNOSIS — M9905 Segmental and somatic dysfunction of pelvic region: Secondary | ICD-10-CM | POA: Diagnosis not present

## 2021-09-01 DIAGNOSIS — M542 Cervicalgia: Secondary | ICD-10-CM | POA: Diagnosis not present

## 2021-09-01 DIAGNOSIS — M9901 Segmental and somatic dysfunction of cervical region: Secondary | ICD-10-CM | POA: Diagnosis not present

## 2021-09-29 DIAGNOSIS — N1831 Chronic kidney disease, stage 3a: Secondary | ICD-10-CM | POA: Diagnosis not present

## 2021-09-29 DIAGNOSIS — R7309 Other abnormal glucose: Secondary | ICD-10-CM | POA: Diagnosis not present

## 2021-10-06 DIAGNOSIS — M9901 Segmental and somatic dysfunction of cervical region: Secondary | ICD-10-CM | POA: Diagnosis not present

## 2021-10-06 DIAGNOSIS — M9905 Segmental and somatic dysfunction of pelvic region: Secondary | ICD-10-CM | POA: Diagnosis not present

## 2021-10-06 DIAGNOSIS — M542 Cervicalgia: Secondary | ICD-10-CM | POA: Diagnosis not present

## 2021-10-06 DIAGNOSIS — M9903 Segmental and somatic dysfunction of lumbar region: Secondary | ICD-10-CM | POA: Diagnosis not present

## 2021-10-06 DIAGNOSIS — M9902 Segmental and somatic dysfunction of thoracic region: Secondary | ICD-10-CM | POA: Diagnosis not present

## 2021-10-06 DIAGNOSIS — M546 Pain in thoracic spine: Secondary | ICD-10-CM | POA: Diagnosis not present

## 2021-10-28 ENCOUNTER — Other Ambulatory Visit (HOSPITAL_COMMUNITY): Payer: Self-pay | Admitting: Family Medicine

## 2021-10-28 DIAGNOSIS — Z1231 Encounter for screening mammogram for malignant neoplasm of breast: Secondary | ICD-10-CM

## 2021-11-03 DIAGNOSIS — M546 Pain in thoracic spine: Secondary | ICD-10-CM | POA: Diagnosis not present

## 2021-11-03 DIAGNOSIS — M9901 Segmental and somatic dysfunction of cervical region: Secondary | ICD-10-CM | POA: Diagnosis not present

## 2021-11-03 DIAGNOSIS — M542 Cervicalgia: Secondary | ICD-10-CM | POA: Diagnosis not present

## 2021-11-03 DIAGNOSIS — M9903 Segmental and somatic dysfunction of lumbar region: Secondary | ICD-10-CM | POA: Diagnosis not present

## 2021-11-03 DIAGNOSIS — M9902 Segmental and somatic dysfunction of thoracic region: Secondary | ICD-10-CM | POA: Diagnosis not present

## 2021-11-03 DIAGNOSIS — M9905 Segmental and somatic dysfunction of pelvic region: Secondary | ICD-10-CM | POA: Diagnosis not present

## 2021-11-25 ENCOUNTER — Ambulatory Visit (HOSPITAL_COMMUNITY): Payer: PPO

## 2021-12-01 DIAGNOSIS — M9903 Segmental and somatic dysfunction of lumbar region: Secondary | ICD-10-CM | POA: Diagnosis not present

## 2021-12-01 DIAGNOSIS — M9905 Segmental and somatic dysfunction of pelvic region: Secondary | ICD-10-CM | POA: Diagnosis not present

## 2021-12-01 DIAGNOSIS — M546 Pain in thoracic spine: Secondary | ICD-10-CM | POA: Diagnosis not present

## 2021-12-01 DIAGNOSIS — M9902 Segmental and somatic dysfunction of thoracic region: Secondary | ICD-10-CM | POA: Diagnosis not present

## 2021-12-01 DIAGNOSIS — M542 Cervicalgia: Secondary | ICD-10-CM | POA: Diagnosis not present

## 2021-12-01 DIAGNOSIS — M9901 Segmental and somatic dysfunction of cervical region: Secondary | ICD-10-CM | POA: Diagnosis not present

## 2021-12-02 ENCOUNTER — Ambulatory Visit (HOSPITAL_COMMUNITY)
Admission: RE | Admit: 2021-12-02 | Discharge: 2021-12-02 | Disposition: A | Discharge status: Home / Self Care | Payer: PPO | Source: Ambulatory Visit | Attending: Family Medicine | Admitting: Family Medicine

## 2021-12-02 DIAGNOSIS — Z1231 Encounter for screening mammogram for malignant neoplasm of breast: Secondary | ICD-10-CM

## 2021-12-08 DIAGNOSIS — H35371 Puckering of macula, right eye: Secondary | ICD-10-CM | POA: Diagnosis not present

## 2022-01-05 DIAGNOSIS — M9901 Segmental and somatic dysfunction of cervical region: Secondary | ICD-10-CM | POA: Diagnosis not present

## 2022-01-05 DIAGNOSIS — M9902 Segmental and somatic dysfunction of thoracic region: Secondary | ICD-10-CM | POA: Diagnosis not present

## 2022-01-05 DIAGNOSIS — M9905 Segmental and somatic dysfunction of pelvic region: Secondary | ICD-10-CM | POA: Diagnosis not present

## 2022-01-05 DIAGNOSIS — M542 Cervicalgia: Secondary | ICD-10-CM | POA: Diagnosis not present

## 2022-01-05 DIAGNOSIS — M546 Pain in thoracic spine: Secondary | ICD-10-CM | POA: Diagnosis not present

## 2022-01-05 DIAGNOSIS — M9903 Segmental and somatic dysfunction of lumbar region: Secondary | ICD-10-CM | POA: Diagnosis not present

## 2022-01-28 DIAGNOSIS — H353111 Nonexudative age-related macular degeneration, right eye, early dry stage: Secondary | ICD-10-CM | POA: Diagnosis not present

## 2022-01-28 DIAGNOSIS — H35373 Puckering of macula, bilateral: Secondary | ICD-10-CM | POA: Diagnosis not present

## 2022-01-28 DIAGNOSIS — H353221 Exudative age-related macular degeneration, left eye, with active choroidal neovascularization: Secondary | ICD-10-CM | POA: Diagnosis not present

## 2022-01-28 DIAGNOSIS — H25813 Combined forms of age-related cataract, bilateral: Secondary | ICD-10-CM | POA: Diagnosis not present

## 2022-02-02 DIAGNOSIS — M542 Cervicalgia: Secondary | ICD-10-CM | POA: Diagnosis not present

## 2022-02-02 DIAGNOSIS — M9902 Segmental and somatic dysfunction of thoracic region: Secondary | ICD-10-CM | POA: Diagnosis not present

## 2022-02-02 DIAGNOSIS — M9901 Segmental and somatic dysfunction of cervical region: Secondary | ICD-10-CM | POA: Diagnosis not present

## 2022-02-02 DIAGNOSIS — M9905 Segmental and somatic dysfunction of pelvic region: Secondary | ICD-10-CM | POA: Diagnosis not present

## 2022-02-02 DIAGNOSIS — M546 Pain in thoracic spine: Secondary | ICD-10-CM | POA: Diagnosis not present

## 2022-02-02 DIAGNOSIS — M9903 Segmental and somatic dysfunction of lumbar region: Secondary | ICD-10-CM | POA: Diagnosis not present

## 2022-02-25 DIAGNOSIS — H353221 Exudative age-related macular degeneration, left eye, with active choroidal neovascularization: Secondary | ICD-10-CM | POA: Diagnosis not present

## 2022-03-02 DIAGNOSIS — M9901 Segmental and somatic dysfunction of cervical region: Secondary | ICD-10-CM | POA: Diagnosis not present

## 2022-03-02 DIAGNOSIS — M542 Cervicalgia: Secondary | ICD-10-CM | POA: Diagnosis not present

## 2022-03-02 DIAGNOSIS — M546 Pain in thoracic spine: Secondary | ICD-10-CM | POA: Diagnosis not present

## 2022-03-02 DIAGNOSIS — M9902 Segmental and somatic dysfunction of thoracic region: Secondary | ICD-10-CM | POA: Diagnosis not present

## 2022-03-02 DIAGNOSIS — M9905 Segmental and somatic dysfunction of pelvic region: Secondary | ICD-10-CM | POA: Diagnosis not present

## 2022-03-02 DIAGNOSIS — M9903 Segmental and somatic dysfunction of lumbar region: Secondary | ICD-10-CM | POA: Diagnosis not present

## 2022-04-01 DIAGNOSIS — H353221 Exudative age-related macular degeneration, left eye, with active choroidal neovascularization: Secondary | ICD-10-CM | POA: Diagnosis not present

## 2022-04-05 DIAGNOSIS — Z79899 Other long term (current) drug therapy: Secondary | ICD-10-CM | POA: Diagnosis not present

## 2022-04-05 DIAGNOSIS — E039 Hypothyroidism, unspecified: Secondary | ICD-10-CM | POA: Diagnosis not present

## 2022-04-05 DIAGNOSIS — K219 Gastro-esophageal reflux disease without esophagitis: Secondary | ICD-10-CM | POA: Diagnosis not present

## 2022-04-05 DIAGNOSIS — Z0001 Encounter for general adult medical examination with abnormal findings: Secondary | ICD-10-CM | POA: Diagnosis not present

## 2022-04-05 DIAGNOSIS — E7800 Pure hypercholesterolemia, unspecified: Secondary | ICD-10-CM | POA: Diagnosis not present

## 2022-04-05 DIAGNOSIS — N1831 Chronic kidney disease, stage 3a: Secondary | ICD-10-CM | POA: Diagnosis not present

## 2022-04-05 DIAGNOSIS — R7301 Impaired fasting glucose: Secondary | ICD-10-CM | POA: Diagnosis not present

## 2022-04-05 DIAGNOSIS — I10 Essential (primary) hypertension: Secondary | ICD-10-CM | POA: Diagnosis not present

## 2022-04-06 DIAGNOSIS — M9905 Segmental and somatic dysfunction of pelvic region: Secondary | ICD-10-CM | POA: Diagnosis not present

## 2022-04-06 DIAGNOSIS — M542 Cervicalgia: Secondary | ICD-10-CM | POA: Diagnosis not present

## 2022-04-06 DIAGNOSIS — M546 Pain in thoracic spine: Secondary | ICD-10-CM | POA: Diagnosis not present

## 2022-04-06 DIAGNOSIS — M9902 Segmental and somatic dysfunction of thoracic region: Secondary | ICD-10-CM | POA: Diagnosis not present

## 2022-04-06 DIAGNOSIS — M9903 Segmental and somatic dysfunction of lumbar region: Secondary | ICD-10-CM | POA: Diagnosis not present

## 2022-04-06 DIAGNOSIS — M9901 Segmental and somatic dysfunction of cervical region: Secondary | ICD-10-CM | POA: Diagnosis not present

## 2022-04-19 DIAGNOSIS — N1831 Chronic kidney disease, stage 3a: Secondary | ICD-10-CM | POA: Diagnosis not present

## 2022-04-29 DIAGNOSIS — H353221 Exudative age-related macular degeneration, left eye, with active choroidal neovascularization: Secondary | ICD-10-CM | POA: Diagnosis not present

## 2022-05-04 DIAGNOSIS — M9901 Segmental and somatic dysfunction of cervical region: Secondary | ICD-10-CM | POA: Diagnosis not present

## 2022-05-04 DIAGNOSIS — M9902 Segmental and somatic dysfunction of thoracic region: Secondary | ICD-10-CM | POA: Diagnosis not present

## 2022-05-04 DIAGNOSIS — M542 Cervicalgia: Secondary | ICD-10-CM | POA: Diagnosis not present

## 2022-05-04 DIAGNOSIS — M9905 Segmental and somatic dysfunction of pelvic region: Secondary | ICD-10-CM | POA: Diagnosis not present

## 2022-05-04 DIAGNOSIS — M9903 Segmental and somatic dysfunction of lumbar region: Secondary | ICD-10-CM | POA: Diagnosis not present

## 2022-05-04 DIAGNOSIS — M546 Pain in thoracic spine: Secondary | ICD-10-CM | POA: Diagnosis not present

## 2022-05-27 DIAGNOSIS — H35373 Puckering of macula, bilateral: Secondary | ICD-10-CM | POA: Diagnosis not present

## 2022-05-27 DIAGNOSIS — H25813 Combined forms of age-related cataract, bilateral: Secondary | ICD-10-CM | POA: Diagnosis not present

## 2022-05-27 DIAGNOSIS — H353221 Exudative age-related macular degeneration, left eye, with active choroidal neovascularization: Secondary | ICD-10-CM | POA: Diagnosis not present

## 2022-05-27 DIAGNOSIS — H18513 Endothelial corneal dystrophy, bilateral: Secondary | ICD-10-CM | POA: Diagnosis not present

## 2022-06-01 DIAGNOSIS — M9903 Segmental and somatic dysfunction of lumbar region: Secondary | ICD-10-CM | POA: Diagnosis not present

## 2022-06-01 DIAGNOSIS — M9902 Segmental and somatic dysfunction of thoracic region: Secondary | ICD-10-CM | POA: Diagnosis not present

## 2022-06-01 DIAGNOSIS — M9901 Segmental and somatic dysfunction of cervical region: Secondary | ICD-10-CM | POA: Diagnosis not present

## 2022-06-01 DIAGNOSIS — M546 Pain in thoracic spine: Secondary | ICD-10-CM | POA: Diagnosis not present

## 2022-06-01 DIAGNOSIS — M542 Cervicalgia: Secondary | ICD-10-CM | POA: Diagnosis not present

## 2022-06-08 DIAGNOSIS — M9905 Segmental and somatic dysfunction of pelvic region: Secondary | ICD-10-CM | POA: Diagnosis not present

## 2022-06-08 DIAGNOSIS — M546 Pain in thoracic spine: Secondary | ICD-10-CM | POA: Diagnosis not present

## 2022-06-08 DIAGNOSIS — M9903 Segmental and somatic dysfunction of lumbar region: Secondary | ICD-10-CM | POA: Diagnosis not present

## 2022-06-08 DIAGNOSIS — M9902 Segmental and somatic dysfunction of thoracic region: Secondary | ICD-10-CM | POA: Diagnosis not present

## 2022-06-08 DIAGNOSIS — M542 Cervicalgia: Secondary | ICD-10-CM | POA: Diagnosis not present

## 2022-06-08 DIAGNOSIS — M9901 Segmental and somatic dysfunction of cervical region: Secondary | ICD-10-CM | POA: Diagnosis not present

## 2022-06-15 ENCOUNTER — Telehealth: Payer: Self-pay | Admitting: Gastroenterology

## 2022-06-15 NOTE — Telephone Encounter (Signed)
Good Afternoon Dr. Rush Landmark,  I have received a referral for this patient from Dr. Dorthy Cooler wishing to establish GI care with you, states her previous provider has retired and she has heard good reviews about you. Patient has history with [facility], all the records there are available to view in Epic. Please review them at your earliest convenience and advise on scheduling.  Thank You!

## 2022-06-16 NOTE — Telephone Encounter (Signed)
Will review by the time I return to the clinic setting next week. GM

## 2022-06-17 ENCOUNTER — Encounter: Payer: Self-pay | Admitting: Gastroenterology

## 2022-06-17 NOTE — Telephone Encounter (Signed)
I have reviewed this patient's chart.  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  Also has a history of previous pancreatitis.  She also has a history of adenomatous colon polyps with plan for 5-year follow-up colonoscopy this year (2024). I am happy to take over the care of this patient. She is also this year, due for colonoscopy. She will likely need updated imaging of her pancreas but that can also wait until she is fully established. She could be scheduled for colonoscopy in the [facility] for surveillance of known colon polyps if she wishes.  If there are other issues that she needs to talk with [place] about before scheduling colonoscopy, then just move forward with getting her scheduled for a clinic visit (APP visit where I can supervise or with me (do not use a held or overbook slot)). Thanks. GM

## 2022-06-17 NOTE — Telephone Encounter (Signed)
Patient is scheduled in clinic 4/17 at 9:50, wishing to discuss acid reflux with Dr Rush Landmark along with her hx of diverticulitis.

## 2022-07-06 DIAGNOSIS — M9901 Segmental and somatic dysfunction of cervical region: Secondary | ICD-10-CM | POA: Diagnosis not present

## 2022-07-06 DIAGNOSIS — M9905 Segmental and somatic dysfunction of pelvic region: Secondary | ICD-10-CM | POA: Diagnosis not present

## 2022-07-06 DIAGNOSIS — M9903 Segmental and somatic dysfunction of lumbar region: Secondary | ICD-10-CM | POA: Diagnosis not present

## 2022-07-06 DIAGNOSIS — M9902 Segmental and somatic dysfunction of thoracic region: Secondary | ICD-10-CM | POA: Diagnosis not present

## 2022-07-06 DIAGNOSIS — M546 Pain in thoracic spine: Secondary | ICD-10-CM | POA: Diagnosis not present

## 2022-07-06 DIAGNOSIS — M542 Cervicalgia: Secondary | ICD-10-CM | POA: Diagnosis not present

## 2022-07-08 DIAGNOSIS — H353221 Exudative age-related macular degeneration, left eye, with active choroidal neovascularization: Secondary | ICD-10-CM | POA: Diagnosis not present

## 2022-07-19 ENCOUNTER — Encounter (INDEPENDENT_AMBULATORY_CARE_PROVIDER_SITE_OTHER): Payer: Self-pay | Admitting: *Deleted

## 2022-08-03 DIAGNOSIS — M9902 Segmental and somatic dysfunction of thoracic region: Secondary | ICD-10-CM | POA: Diagnosis not present

## 2022-08-03 DIAGNOSIS — M542 Cervicalgia: Secondary | ICD-10-CM | POA: Diagnosis not present

## 2022-08-03 DIAGNOSIS — M9903 Segmental and somatic dysfunction of lumbar region: Secondary | ICD-10-CM | POA: Diagnosis not present

## 2022-08-03 DIAGNOSIS — M9901 Segmental and somatic dysfunction of cervical region: Secondary | ICD-10-CM | POA: Diagnosis not present

## 2022-08-03 DIAGNOSIS — M546 Pain in thoracic spine: Secondary | ICD-10-CM | POA: Diagnosis not present

## 2022-08-03 DIAGNOSIS — M9905 Segmental and somatic dysfunction of pelvic region: Secondary | ICD-10-CM | POA: Diagnosis not present

## 2022-08-17 ENCOUNTER — Ambulatory Visit: Payer: PPO | Admitting: Gastroenterology

## 2022-08-31 ENCOUNTER — Ambulatory Visit: Payer: PPO | Admitting: Podiatry

## 2022-08-31 ENCOUNTER — Encounter: Payer: Self-pay | Admitting: Podiatry

## 2022-08-31 ENCOUNTER — Ambulatory Visit (INDEPENDENT_AMBULATORY_CARE_PROVIDER_SITE_OTHER): Payer: PPO

## 2022-08-31 DIAGNOSIS — M778 Other enthesopathies, not elsewhere classified: Secondary | ICD-10-CM

## 2022-08-31 DIAGNOSIS — M722 Plantar fascial fibromatosis: Secondary | ICD-10-CM | POA: Diagnosis not present

## 2022-08-31 MED ORDER — METHYLPREDNISOLONE 4 MG PO TBPK: ORAL_TABLET | ORAL | 0 refills | Status: DC

## 2022-08-31 MED ORDER — TRIAMCINOLONE ACETONIDE 40 MG/ML IJ SUSP
20.0000 mg | Freq: Once | INTRAMUSCULAR | Status: AC
  Administered 2022-08-31: 20 mg

## 2022-08-31 NOTE — Progress Notes (Signed)
Patient ID: Deanna [name], female    DOB: 07-01-1950,  MRN: [id number] HPI Chief Complaint  Patient presents with   Foot Pain    Plantar heel right - aching x 3 months, AM pain, thinks a bone spur, Tylenol  Dorsal midfoot left - swelling x couple weeks, no injury   New Patient (Initial Visit)    72 y.o. female presents with the above complaint.   ROS: Denies fever chills nausea vomit muscle aches pains calf pain back pain chest pain shortness of breath.  Has a history of pancreatitis.  Past Medical History:  Diagnosis Date   Chronic kidney disease    pt. reports that during episode of pancreatitis, she experienced decreased kidney function related to contrast dye    GERD (gastroesophageal reflux disease)    History of hiatal hernia    HTN (hypertension)    Hx of pancreatitis 2015   attributes to Etodolac and Zpack   Kidney disease, chronic, stage III (GFR 30-59 ml/min) (HCC) 05/19/2015   Primary localized osteoarthritis of left knee    Primary localized osteoarthritis of right knee    Reflux    Past Surgical History:  Procedure Laterality Date   COLONOSCOPY     COLONOSCOPY N/A 08/23/2017   Procedure: COLONOSCOPY;  Surgeon: Malissa Hippo, MD;  [facility];  Service: Endoscopy;  Laterality: N/A;  200   DIAGNOSTIC LAPAROSCOPY     EUS N/A 08/15/2013   Procedure: UPPER ENDOSCOPIC ULTRASOUND (EUS) LINEAR;  Surgeon: Rachael Fee, MD;  Location: [facility];  Service: Endoscopy;  Laterality: N/A;   FOOT SURGERY     FOOT SURGERY Bilateral    bunions   JOINT REPLACEMENT Bilateral    knees   KNEE ARTHROSCOPY W/ MENISCAL REPAIR Left    LAPAROSCOPIC ENDOMETRIOSIS FULGURATION     pancreatitis     caused by Etodalac   POLYPECTOMY  08/23/2017   Procedure: POLYPECTOMY;  Surgeon: Malissa Hippo, MD;  [facility];  Service: Endoscopy;;  cecal (HSx1, CSx1), ascending colon (CS x1), splenic flexure (CS x1)   TOTAL KNEE ARTHROPLASTY Right 05/18/2015    Procedure: TOTAL KNEE ARTHROPLASTY; RIGHT KNEE;  Surgeon: Salvatore Marvel, MD;  [facility];  Service: Orthopedics;  Laterality: Right;   TOTAL KNEE ARTHROPLASTY Left 11/16/2015   TOTAL KNEE ARTHROPLASTY Left 11/16/2015   Procedure: LEFT TOTAL KNEE ARTHROPLASTY;  Surgeon: Salvatore Marvel, MD;  Location: [facility];  Service: Orthopedics;  Laterality: Left;   trigger thumb     rt thumb last year    Current Outpatient Medications:    methylPREDNISolone (MEDROL DOSEPAK) 4 MG TBPK tablet, 6 day dose pack - take as directed, Disp: 21 tablet, Rfl: 0   amLODipine (NORVASC) 10 MG tablet, Take 10 mg by mouth daily., Disp: , Rfl:    Ascorbic Acid (VITAMIN C) 1000 MG tablet, Take 1,000 mg by mouth daily., Disp: , Rfl:    atorvastatin (LIPITOR) 20 MG tablet, Take 20 mg by mouth daily after breakfast. , Disp: , Rfl:    Coenzyme Q10 (COQ10) 200 MG CAPS, Take 200 mg by mouth daily., Disp: , Rfl:    dexlansoprazole (DEXILANT) 60 MG capsule, Take 60 mg by mouth 2 (two) times a week., Disp: , Rfl:    famotidine (PEPCID) 20 MG tablet, Take 20 mg by mouth 3 (three) times daily., Disp: , Rfl:    Krill Oil 500 MG CAPS, Take 500 mg by mouth daily., Disp: , Rfl:    levothyroxine (  SYNTHROID, LEVOTHROID) 50 MCG tablet, Take 50 mcg by mouth daily before breakfast., Disp: , Rfl:    magnesium oxide (MAG-OX) 400 MG tablet, Take 400 mg by mouth daily., Disp: , Rfl:    Multiple Vitamins-Minerals (CENTRUM SILVER 50+WOMEN PO), Take by mouth daily., Disp: , Rfl:    vitamin B-12 (CYANOCOBALAMIN) 1000 MCG tablet, Take 1,000 mcg by mouth daily., Disp: , Rfl:    zinc gluconate 50 MG tablet, Take 50 mg by mouth daily., Disp: , Rfl:   Allergies  Allergen Reactions   Contrast Media [Iodinated Contrast Media] Other (See Comments)    Renal function decreased    Etodolac Other (See Comments)    Pancreatitis    Z-Pak [Azithromycin] Other (See Comments)    Pancreatitis    Dilaudid [Hydromorphone Hcl] Other (See Comments)      hallucinations   Hydrocodone Nausea And Vomiting   Oxycodone Other (See Comments)    Severe constipation   Sulfa Antibiotics Itching and Rash   Sulfasalazine Itching and Rash   Review of Systems Objective:  There were no vitals filed for this visit.  General: Well developed, nourished, in no acute distress, alert and oriented x3   Dermatological: Skin is warm, dry and supple bilateral. Nails x 10 are well maintained; remaining integument appears unremarkable at this time. There are no open sores, no preulcerative lesions, no rash or signs of infection present.  Vascular: Dorsalis Pedis artery and Posterior Tibial artery pedal pulses are 2/4 bilateral with immedate capillary fill time. Pedal hair growth present. No varicosities and no lower extremity edema present bilateral.   Neruologic: Grossly intact via light touch bilateral. Vibratory intact via tuning fork bilateral. Protective threshold with Semmes Wienstein monofilament intact to all pedal sites bilateral. Patellar and Achilles deep tendon reflexes 2+ bilateral. No Babinski or clonus noted bilateral.   Musculoskeletal: No gross boney pedal deformities bilateral. No pain, crepitus, or limitation noted with foot and ankle range of motion bilateral. Muscular strength 5/5 in all groups tested bilateral.  Gait: Unassisted, Nonantalgic.    Radiographs:  Radiographs taken today demonstrate osseously mature individual retained K wires to the first metatarsal bilaterally status post bunion repair.  She also has developed osteoarthritis to the second and third tarsometatarsal joints of the left foot.  Mild valgus deformity still noted on the left foot.  Mild to moderate demineralization bilateral.  Right foot does demonstrate soft tissue increase in disease clinic retrocalcaneal surgical site with retained K wires to the first metatarsal.  Assessment & Plan:   Assessment: Planter fasciitis right and osteoarthritis tarsometatarsal joints  left  Plan: Discussed etiology pathology conservative surgical therapies at this point I am not going to give her any nonsteroidals due to her kidney history as well as her pancreatitis.  I am going to start her on methylprednisolone injected the heel 20 mg Kenalog 5 mg Marcaine point maximal tenderness right.  Discussed appropriate shoe gear stretching exercise ice therapy sugar modifications.     Nivek Powley T. [place], [place]

## 2022-09-01 DIAGNOSIS — M9905 Segmental and somatic dysfunction of pelvic region: Secondary | ICD-10-CM | POA: Diagnosis not present

## 2022-09-01 DIAGNOSIS — M9901 Segmental and somatic dysfunction of cervical region: Secondary | ICD-10-CM | POA: Diagnosis not present

## 2022-09-01 DIAGNOSIS — M9903 Segmental and somatic dysfunction of lumbar region: Secondary | ICD-10-CM | POA: Diagnosis not present

## 2022-09-01 DIAGNOSIS — M9902 Segmental and somatic dysfunction of thoracic region: Secondary | ICD-10-CM | POA: Diagnosis not present

## 2022-09-01 DIAGNOSIS — M542 Cervicalgia: Secondary | ICD-10-CM | POA: Diagnosis not present

## 2022-09-01 DIAGNOSIS — M546 Pain in thoracic spine: Secondary | ICD-10-CM | POA: Diagnosis not present

## 2022-09-02 DIAGNOSIS — H353221 Exudative age-related macular degeneration, left eye, with active choroidal neovascularization: Secondary | ICD-10-CM | POA: Diagnosis not present

## 2022-10-04 ENCOUNTER — Encounter: Payer: Self-pay | Admitting: Podiatry

## 2022-10-04 ENCOUNTER — Ambulatory Visit: Payer: PPO | Admitting: Podiatry

## 2022-10-04 DIAGNOSIS — M778 Other enthesopathies, not elsewhere classified: Secondary | ICD-10-CM | POA: Diagnosis not present

## 2022-10-04 DIAGNOSIS — M722 Plantar fascial fibromatosis: Secondary | ICD-10-CM

## 2022-10-04 NOTE — Progress Notes (Signed)
She presents today for follow-up of her fasciitis and dorsal tarsometatarsal joint capsulitis.  She states that they are 90 to 100% improved.  Continues to wear good shoe gear was able to take her methylprednisolone without any problems.  And has a new pair of [employer].  Objective: Vital signs are stable alert and oriented x 3.  There is no erythema edema salines drainage or odor no indurated pain on palpation medial calcaneal tubercles or dorsal aspect of the foot.  Assessment: 100% resolution of symptomatology.  Plan: Follow-up with [place] on as-needed basis discussed appropriate shoe gear once again.

## 2022-10-05 DIAGNOSIS — M542 Cervicalgia: Secondary | ICD-10-CM | POA: Diagnosis not present

## 2022-10-05 DIAGNOSIS — M546 Pain in thoracic spine: Secondary | ICD-10-CM | POA: Diagnosis not present

## 2022-10-05 DIAGNOSIS — M9902 Segmental and somatic dysfunction of thoracic region: Secondary | ICD-10-CM | POA: Diagnosis not present

## 2022-10-05 DIAGNOSIS — M9903 Segmental and somatic dysfunction of lumbar region: Secondary | ICD-10-CM | POA: Diagnosis not present

## 2022-10-05 DIAGNOSIS — M9905 Segmental and somatic dysfunction of pelvic region: Secondary | ICD-10-CM | POA: Diagnosis not present

## 2022-10-05 DIAGNOSIS — M9901 Segmental and somatic dysfunction of cervical region: Secondary | ICD-10-CM | POA: Diagnosis not present

## 2022-10-19 ENCOUNTER — Ambulatory Visit: Payer: PPO | Admitting: Gastroenterology

## 2022-10-19 ENCOUNTER — Encounter: Payer: Self-pay | Admitting: Gastroenterology

## 2022-10-19 ENCOUNTER — Other Ambulatory Visit (INDEPENDENT_AMBULATORY_CARE_PROVIDER_SITE_OTHER): Payer: PPO

## 2022-10-19 VITALS — BP 130/80 | HR 76 | Ht 66.5 in | Wt 236.0 lb

## 2022-10-19 DIAGNOSIS — K869 Disease of pancreas, unspecified: Secondary | ICD-10-CM

## 2022-10-19 DIAGNOSIS — Z86010 Personal history of colonic polyps: Secondary | ICD-10-CM

## 2022-10-19 DIAGNOSIS — K219 Gastro-esophageal reflux disease without esophagitis: Secondary | ICD-10-CM

## 2022-10-19 DIAGNOSIS — K862 Cyst of pancreas: Secondary | ICD-10-CM

## 2022-10-19 DIAGNOSIS — K8500 Idiopathic acute pancreatitis without necrosis or infection: Secondary | ICD-10-CM

## 2022-10-19 LAB — COMPREHENSIVE METABOLIC PANEL
ALT: 19 U/L (ref 0–35)
AST: 22 U/L (ref 0–37)
Albumin: 4.2 g/dL (ref 3.5–5.2)
Alkaline Phosphatase: 108 U/L (ref 39–117)
BUN: 22 mg/dL (ref 6–23)
CO2: 27 mEq/L (ref 19–32)
Calcium: 9.3 mg/dL (ref 8.4–10.5)
Chloride: 102 mEq/L (ref 96–112)
Creatinine, Ser: 1.37 mg/dL — ABNORMAL HIGH (ref 0.40–1.20)
GFR: 38.76 mL/min — ABNORMAL LOW (ref >60.00)
Glucose, Bld: 96 mg/dL (ref 70–99)
Potassium: 4 mEq/L (ref 3.5–5.1)
Sodium: 140 mEq/L (ref 135–145)
Total Bilirubin: 0.5 mg/dL (ref 0.2–1.2)
Total Protein: 7.8 g/dL (ref 6.0–8.3)

## 2022-10-19 LAB — CBC
HCT: 41 % (ref 36.0–46.0)
Hemoglobin: 13.7 g/dL (ref 12.0–15.0)
MCHC: 33.4 g/dL (ref 30.0–36.0)
MCV: 93.7 fl (ref 78.0–100.0)
Platelets: 334 10*3/uL (ref 150.0–400.0)
RBC: 4.37 Mil/uL (ref 3.87–5.11)
RDW: 14.3 % (ref 11.5–15.5)
WBC: 10.3 10*3/uL (ref 4.0–10.5)

## 2022-10-19 MED ORDER — NA SULFATE-K SULFATE-MG SULF 17.5-3.13-1.6 GM/177ML PO SOLN: ORAL | 0 refills | Status: DC

## 2022-10-19 MED ORDER — ESOMEPRAZOLE MAGNESIUM 40 MG PO CPDR: 40.0000 mg | DELAYED_RELEASE_CAPSULE | Freq: Every day | ORAL | 12 refills | Status: AC

## 2022-10-19 NOTE — Patient Instructions (Signed)
You have been scheduled for an endoscopy and colonoscopy. Please follow the written instructions given to you at your visit today. Please pick up your prep supplies at the pharmacy within the next 1-3 days. If you use inhalers (even only as needed), please bring them with you on the day of your procedure. _____________________________________  Your provider has requested that you go to the basement level for lab work before leaving today. Press "B" on the elevator. The lab is located at the first door on the left as you exit the elevator. _____________________________________  Bonita Quin have been scheduled for an MRI at [facility] on Wednesday 10/26/22 at 3:30 pm. Your appointment time is 3:45 pm. Please arrive to admitting (at main entrance of the hospital) 30 minutes prior to your appointment time for registration purposes. Please make certain not to have anything to eat or drink 6 hours prior to your test. In addition, if you have any metal in your body, have a pacemaker or defibrillator, please be sure to let your ordering physician know. This test typically takes 45 minutes to 1 hour to complete. Should you need to reschedule, please call [phone] to do so. ___________________________________  DISCONTINUE Dexilant. ___________________________________  We have sent the following medications to your pharmacy for you to pick up at your convenience: Nexium 40 mg daily (in place of Dexilant) ___________________________________  Bonita Quin may continue famotidine.  _______________________________________________________  If your blood pressure at your visit was 140/90 or greater, please contact your primary care physician to follow up on this.  _______________________________________________________  If you are age 36 or older, your body mass index should be between 23-30. Your Body mass index is 37.52 kg/m. If this is out of the aforementioned range listed, please consider follow up with  your Primary Care Provider.  If you are age 52 or younger, your body mass index should be between 19-25. Your Body mass index is 37.52 kg/m. If this is out of the aformentioned range listed, please consider follow up with your Primary Care Provider.   ________________________________________________________  The [facility] providers would like to encourage you to use [facility] to communicate with providers for non-urgent requests or questions.  Due to long hold times on the telephone, sending your provider a message by [facility] may be a faster and more efficient way to get a response.  Please allow 48 business hours for a response.  Please remember that this is for non-urgent requests.  _______________________________________________________ Due to recent changes in healthcare laws, you may see the results of your imaging and laboratory studies on MyChart before your provider has had a chance to review them.  We understand that in some cases there may be results that are confusing or concerning to you. Not all laboratory results come back in the same time frame and the provider may be waiting for multiple results in order to interpret others.  Please give [place] 48 hours in order for your provider to thoroughly review all the results before contacting the office for clarification of your results.

## 2022-10-19 NOTE — Progress Notes (Signed)
GASTROENTEROLOGY OUTPATIENT CLINIC VISIT   Primary Care Provider Argentine, Carilyn Goodpasture, MD [address] [place] [address] [phone]  Referring Provider Darrow Bussing, MD [address] [place],  [place] [address] [phone]  Patient Profile: Deanna [name] is a 72 y.o. female with a pmh significant for hypertension, obesity, chronic renal insufficiency, arthritis, GERD, Idiopathic pancreatitis (2 episodes ?Etodolac then ?Z-Pak), colon polyps (TA's), pancreatic cysts (IPMN's), pancreatic splenule.  The patient presents to the [facility] for an evaluation and management of problem(s) noted below:  Problem List 1. Hx of adenomatous colonic polyps   2. Gastroesophageal reflux disease, unspecified whether esophagitis present   3. Pancreatic cyst   4. Pancreatic splenule (tail)   5. Idiopathic acute pancreatitis without infection or necrosis     History of Present Illness This is the patient's first visit to the outpatient [facility].  She has previously been followed by Dr. Karilyn Cota.  Her history is interesting as she has had 2 episodes of pancreatitis which have felt to be drug-induced (1 due to etodolac and another due to a Z-Pak).  Thankfully she is gone 9 years without any further episodes by staying away from both of these medications.  She did undergo an EUS where there was thought she could have pancreatic divisum but the MRI/MRCP did not show evidence of pancreatic divisum but did show pancreatic cysts consistent with IPMN's that were subcentimeter a pancreatic splenule.  Her last imaging was in 2022.  Patient does have longstanding acid reflux symptoms.  She takes famotidine on a daily basis and will take either Dexilant or Nexium or both at least once to twice per week due to progressive symptoms.  She does not have any dysphagia or odynophagia symptoms.  Patient has dealt with alternating constipation and diarrhea at  times but as long as she takes Colace twice daily, currently she is doing well and moving her bowels on a near daily basis.  She denies any blood in her stools.  She has had hemorrhoids that she has felt in the past.  Overall she has been feeling well.  Her last colonoscopy was in 2019 with 4 polyps that were removed.  She was recommended a 5-year follow-up.  She has a sister who has a history of stomach cancer but no other GI malignancies in her family.  She is a retired Programmer, systems.  The patient does not take significant nonsteroidals or BC/Goody powders.  GI Review of Systems Positive as above Negative for abdominal pain, nausea, vomiting, melena, hematochezia, alteration of bowel habits  Review of Systems General: Denies fevers/chills/weight loss unintentionally Cardiovascular: Denies chest pain Pulmonary: Denies shortness of breath Gastroenterological: See HPI Genitourinary: Denies darkened urine Hematological: Denies easy bruising/bleeding Dermatological: Denies jaundice Psychological: Mood is stable   Medications Current Outpatient Medications  Medication Sig Dispense Refill   amLODipine (NORVASC) 10 MG tablet Take 10 mg by mouth daily.     Ascorbic Acid (VITAMIN C) 1000 MG tablet Take 1,000 mg by mouth daily.     atorvastatin (LIPITOR) 20 MG tablet Take 20 mg by mouth daily after breakfast.      Coenzyme Q10 (COQ10) 200 MG CAPS Take 200 mg by mouth daily.     esomeprazole (NEXIUM) 40 MG capsule Take 1 capsule (40 mg total) by mouth daily. 30 capsule 12   famotidine (PEPCID) 20 MG tablet Take 20 mg by mouth 3 (three) times daily.     Krill Oil 500 MG  CAPS Take 500 mg by mouth daily.     levothyroxine (SYNTHROID, LEVOTHROID) 50 MCG tablet Take 50 mcg by mouth daily before breakfast.     magnesium oxide (MAG-OX) 400 MG tablet Take 400 mg by mouth daily.     Multiple Vitamins-Minerals (CENTRUM SILVER 50+WOMEN PO) Take by mouth daily.     Na Sulfate-K Sulfate-Mg Sulf 17.5-3.13-1.6  GM/177ML SOLN Use as directed; may use generic; goodrx card if insurance will not cover generic 354 mL 0   vitamin B-12 (CYANOCOBALAMIN) 1000 MCG tablet Take 1,000 mcg by mouth daily.     zinc gluconate 50 MG tablet Take 50 mg by mouth daily.     No current facility-administered medications for this visit.    Allergies Allergies  Allergen Reactions   Contrast Media [Iodinated Contrast Media] Other (See Comments)    Renal function decreased    Etodolac Other (See Comments)    Pancreatitis    Z-Pak [Azithromycin] Other (See Comments)    Pancreatitis    Dilaudid [Hydromorphone Hcl] Other (See Comments)     hallucinations   Hydrocodone Nausea And Vomiting   Oxycodone Other (See Comments)    Severe constipation   Sulfa Antibiotics Itching and Rash   Sulfasalazine Itching and Rash    Histories Past Medical History:  Diagnosis Date   Chronic kidney disease    pt. reports that during episode of pancreatitis, she experienced decreased kidney function related to contrast dye    Colon polyps    GERD (gastroesophageal reflux disease)    History of hiatal hernia    HTN (hypertension)    Hx of pancreatitis 05/02/2013   attributes to Etodolac and Zpack   Kidney disease, chronic, stage III (GFR 30-59 ml/min) (HCC) 05/19/2015   Obesity    Pancreatitis    Primary localized osteoarthritis of left knee    Primary localized osteoarthritis of right knee    Reflux    Past Surgical History:  Procedure Laterality Date   COLONOSCOPY     COLONOSCOPY N/A 08/23/2017   Procedure: COLONOSCOPY;  Surgeon: Malissa Hippo, MD;  [facility];  Service: Endoscopy;  Laterality: N/A;  200   DIAGNOSTIC LAPAROSCOPY     EUS N/A 08/15/2013   Procedure: UPPER ENDOSCOPIC ULTRASOUND (EUS) LINEAR;  Surgeon: Rachael Fee, MD;  [facility]: [facility];  Service: Endoscopy;  Laterality: N/A;   FOOT SURGERY     FOOT SURGERY Bilateral    bunions   JOINT REPLACEMENT Bilateral    knees   KNEE  ARTHROSCOPY W/ MENISCAL REPAIR Left    LAPAROSCOPIC ENDOMETRIOSIS FULGURATION     pancreatitis     caused by Etodalac   POLYPECTOMY  08/23/2017   Procedure: POLYPECTOMY;  Surgeon: Malissa Hippo, MD;  [facility];  Service: Endoscopy;;  cecal (HSx1, CSx1), ascending colon (CS x1), splenic flexure (CS x1)   TOTAL KNEE ARTHROPLASTY Right 05/18/2015   Procedure: TOTAL KNEE ARTHROPLASTY; RIGHT KNEE;  Surgeon: Salvatore Marvel, MD;  [facility]: [facility];  Service: Orthopedics;  Laterality: Right;   TOTAL KNEE ARTHROPLASTY Left 11/16/2015   TOTAL KNEE ARTHROPLASTY Left 11/16/2015   Procedure: LEFT TOTAL KNEE ARTHROPLASTY;  Surgeon: Salvatore Marvel, MD;  Location: [facility] OR;  Service: Orthopedics;  Laterality: Left;   trigger thumb     rt thumb last year   Social History   Socioeconomic History   Marital status: Married    Spouse name: Not on file   Number of children: 1   Years  of education: 16   Highest education level: Not on file  Occupational History   Occupation: retired   Occupation: retired  Tobacco Use   Smoking status: Never   Smokeless tobacco: Never  Building services engineer Use: Never used  Substance and Sexual Activity   Alcohol use: No   Drug use: No   Sexual activity: Not Currently  Other Topics Concern   Not on file  Social History Narrative   Not on file   Social Determinants of Health   Financial Resource Strain: Not on file  Food Insecurity: Not on file  Transportation Needs: Not on file  Physical Activity: Not on file  Stress: Not on file  Social Connections: Not on file  Intimate Partner Violence: Not on file   Family History  Problem Relation Age of Onset   Aneurysm Mother    Diabetes Father    Hypertension Father    Cancer - Other Sister    Colon cancer Neg Hx    Stomach cancer Neg Hx    Esophageal cancer Neg Hx    Inflammatory bowel disease Neg Hx    Liver disease Neg Hx    Pancreatic cancer Neg Hx    Rectal cancer Neg Hx    I have reviewed her  medical, social, and family history in detail and updated the electronic medical record as necessary.    PHYSICAL EXAMINATION  BP 130/80   Pulse 76   Ht 5' 6.5" (1.689 m)   Wt 236 lb (107 kg)   BMI 37.52 kg/m  Wt Readings from Last 3 Encounters:  10/19/22 236 lb (107 kg)  05/27/19 252 lb (114.3 kg)  08/23/17 235 lb (106.6 kg)  GEN: NAD, appears stated age, doesn't appear chronically ill PSYCH: Cooperative, without pressured speech EYE: Conjunctivae pink, sclerae anicteric ENT: MMM CV: Nontachycardic RESP: No audible wheezing GI: NABS, soft, rounded, NT, without rebound or guarding MSK/EXT: No significant lower extremity edema SKIN: No jaundice NEURO:  Alert & Oriented x 3, no focal deficits   REVIEW OF DATA  I reviewed the following data at the time of this encounter:  GI Procedures and Studies  2019 colonoscopy - Three 4 to 6 mm polyps at the splenic flexure, in the ascending colon and in the cecum, removed with a cold snare. Resected and retrieved. - One 6 to 10 mm polyp in the cecum, removed with a hot snare. Resected and retrieved. - Diverticulosis in the sigmoid colon. - Internal hemorrhoids.  Pathology Diagnosis 1. Colon, polyp(s), cecal, ascending, splenic flexure - TUBULAR ADENOMA (THREE FRAGMENTS). - NO HIGH GRADE DYSPLASIA OR MALIGNANCY. 2. Colon, polyp(s), cecal - TUBULAR ADENOMA (ONE FRAGMENT). - NO HIGH GRADE DYSPLASIA OR MALIGNANCY.  Laboratory Studies  Reviewed those in epic  Imaging Studies  December 2022 MRI/MRCP IMPRESSION: 1. Stable subcentimeter simple appearing cystic scattered throughout the pancreas measuring up to 7 mm, not substantially changed dating back to MRI 2015 likely reflecting nonaggressive pancreatic cystic lesions such as side-branch IPMNs on this noncontrast MRI. Given greater than 5 years of stability of these lesions no further imaging follow-up is required. 2. Stable 1.7 cm solid mass in the pancreatic tail  previously characterized as a splenule on MRI May 08, 2020. 3. Moderate hiatal hernia.   ASSESSMENT  Ms. Lisenby is a 72 y.o. female with a pmh significant for hypertension, obesity, chronic renal insufficiency, arthritis, GERD, Idiopathic pancreatitis (2 episodes ?Etodolac then ?Z-Pak), colon polyps (TA's), pancreatic cysts (IPMN's), pancreatic splenule.  The  patient is seen today for evaluation and management of:  1. Hx of adenomatous colonic polyps   2. Gastroesophageal reflux disease, unspecified whether esophagitis present   3. Pancreatic cyst   4. Pancreatic splenule (tail)   5. Idiopathic acute pancreatitis without infection or necrosis    The patient is hemodynamically and clinically stable at this time.  She has a history of adenomatous colon polyps and is due for colon cancer screening and colon polyp surveillance.  She also has longstanding GERD symptoms.  I did ask her to make a decision about 1 PPI rather than using 2 separate PPIs and an H2 RA blocker.  She will continue her H2 RA blocker and then use Nexium 40 mg daily or as needed during the week.  She is due for Barrett's screening due to her longstanding history of GERD.  We will move forward with an EGD at the time of her surveillance colonoscopy.  Thankfully she has not had any further episodes of pancreatitis.  It is interesting that she has had small IPMN's noted in the past.  I think she needs updated imaging just to ensure that there is been overall stability of her pancreatic lesions.  Will then consider whether we can discontinue surveillance.  The pancreatic splenule has been previously characterized and hopefully there is been no changes that would make this a concerning finding.  The risks and benefits of endoscopic evaluation were discussed with the patient; these include but are not limited to the risk of perforation, infection, bleeding, missed lesions, lack of diagnosis, severe illness requiring hospitalization, as  well as anesthesia and sedation related illnesses.  The patient and/or family is agreeable to proceed.  All patient questions were answered to the best of my ability, and the patient agrees to the aforementioned plan of action with follow-up as indicated.   PLAN  Laboratories as outlined below Continue H2 RA blocker daily Stop Dexilant and just have PPI as Nexium 40 mg Continue Colace as you are doing Will consider fiber supplementation MRI/MRCP for follow-up pancreatic lesion/cyst/splenule - Will determine surveillance versus discontinuation of surveillance based on findings EGD for Barrett's screening Colonoscopy for surveillance of previous adenomas   Orders Placed This Encounter  Procedures   MR ABDOMEN MRCP WO CONTRAST   CBC   Comp Met (CMET)   Ambulatory referral to Gastroenterology    New Prescriptions   ESOMEPRAZOLE (NEXIUM) 40 MG CAPSULE    Take 1 capsule (40 mg total) by mouth daily.   NA SULFATE-K SULFATE-MG SULF 17.5-3.13-1.6 GM/177ML SOLN    Use as directed; may use generic; goodrx card if insurance will not cover generic   Modified Medications   No medications on file    Planned Follow Up No follow-ups on file.   Total Time in Face-to-Face and in Coordination of Care for patient including independent/personal interpretation/review of prior testing, medical history, examination, medication adjustment, communicating results with the patient directly, and documentation within the EHR is 45 minutes.Corliss Parish, MD [facility] Office # [phone]

## 2022-10-20 ENCOUNTER — Encounter: Payer: Self-pay | Admitting: Gastroenterology

## 2022-10-21 DIAGNOSIS — Z860101 Personal history of adenomatous and serrated colon polyps: Secondary | ICD-10-CM | POA: Insufficient documentation

## 2022-10-21 DIAGNOSIS — Z86010 Personal history of colonic polyps: Secondary | ICD-10-CM | POA: Insufficient documentation

## 2022-10-21 DIAGNOSIS — K862 Cyst of pancreas: Secondary | ICD-10-CM | POA: Insufficient documentation

## 2022-10-26 ENCOUNTER — Ambulatory Visit (HOSPITAL_COMMUNITY): Payer: PPO

## 2022-10-31 ENCOUNTER — Other Ambulatory Visit (HOSPITAL_COMMUNITY): Payer: Self-pay | Admitting: Family Medicine

## 2022-10-31 DIAGNOSIS — Z1231 Encounter for screening mammogram for malignant neoplasm of breast: Secondary | ICD-10-CM

## 2022-11-02 DIAGNOSIS — H353221 Exudative age-related macular degeneration, left eye, with active choroidal neovascularization: Secondary | ICD-10-CM | POA: Diagnosis not present

## 2022-11-04 DIAGNOSIS — M542 Cervicalgia: Secondary | ICD-10-CM | POA: Diagnosis not present

## 2022-11-04 DIAGNOSIS — M9901 Segmental and somatic dysfunction of cervical region: Secondary | ICD-10-CM | POA: Diagnosis not present

## 2022-11-04 DIAGNOSIS — M9902 Segmental and somatic dysfunction of thoracic region: Secondary | ICD-10-CM | POA: Diagnosis not present

## 2022-11-04 DIAGNOSIS — M546 Pain in thoracic spine: Secondary | ICD-10-CM | POA: Diagnosis not present

## 2022-11-04 DIAGNOSIS — M9905 Segmental and somatic dysfunction of pelvic region: Secondary | ICD-10-CM | POA: Diagnosis not present

## 2022-11-04 DIAGNOSIS — M9903 Segmental and somatic dysfunction of lumbar region: Secondary | ICD-10-CM | POA: Diagnosis not present

## 2022-11-15 ENCOUNTER — Ambulatory Visit (HOSPITAL_COMMUNITY)
Admission: RE | Admit: 2022-11-15 | Discharge: 2022-11-15 | Disposition: A | Discharge status: Home / Self Care | Payer: PPO | Source: Ambulatory Visit | Attending: Gastroenterology | Admitting: Gastroenterology

## 2022-11-15 ENCOUNTER — Other Ambulatory Visit: Payer: Self-pay | Admitting: Gastroenterology

## 2022-11-15 DIAGNOSIS — K869 Disease of pancreas, unspecified: Secondary | ICD-10-CM

## 2022-11-15 DIAGNOSIS — K219 Gastro-esophageal reflux disease without esophagitis: Secondary | ICD-10-CM

## 2022-11-15 DIAGNOSIS — Z86010 Personal history of colonic polyps: Secondary | ICD-10-CM | POA: Insufficient documentation

## 2022-11-15 DIAGNOSIS — K8689 Other specified diseases of pancreas: Secondary | ICD-10-CM | POA: Diagnosis not present

## 2022-11-21 DIAGNOSIS — Z6838 Body mass index (BMI) 38.0-38.9, adult: Secondary | ICD-10-CM | POA: Diagnosis not present

## 2022-11-21 DIAGNOSIS — N1831 Chronic kidney disease, stage 3a: Secondary | ICD-10-CM | POA: Diagnosis not present

## 2022-11-21 DIAGNOSIS — R7301 Impaired fasting glucose: Secondary | ICD-10-CM | POA: Diagnosis not present

## 2022-11-21 DIAGNOSIS — H353220 Exudative age-related macular degeneration, left eye, stage unspecified: Secondary | ICD-10-CM | POA: Diagnosis not present

## 2022-11-21 DIAGNOSIS — Z79899 Other long term (current) drug therapy: Secondary | ICD-10-CM | POA: Diagnosis not present

## 2022-11-21 DIAGNOSIS — H353110 Nonexudative age-related macular degeneration, right eye, stage unspecified: Secondary | ICD-10-CM | POA: Diagnosis not present

## 2022-11-21 DIAGNOSIS — I10 Essential (primary) hypertension: Secondary | ICD-10-CM | POA: Diagnosis not present

## 2022-11-21 DIAGNOSIS — K219 Gastro-esophageal reflux disease without esophagitis: Secondary | ICD-10-CM | POA: Diagnosis not present

## 2022-11-21 DIAGNOSIS — E039 Hypothyroidism, unspecified: Secondary | ICD-10-CM | POA: Diagnosis not present

## 2022-11-21 DIAGNOSIS — E7800 Pure hypercholesterolemia, unspecified: Secondary | ICD-10-CM | POA: Diagnosis not present

## 2022-12-05 ENCOUNTER — Ambulatory Visit (HOSPITAL_COMMUNITY)
Admission: RE | Admit: 2022-12-05 | Discharge: 2022-12-05 | Disposition: A | Discharge status: Home / Self Care | Payer: PPO | Source: Ambulatory Visit | Attending: Family Medicine | Admitting: Family Medicine

## 2022-12-05 ENCOUNTER — Encounter (HOSPITAL_COMMUNITY): Payer: Self-pay

## 2022-12-05 ENCOUNTER — Encounter: Payer: Self-pay | Admitting: Gastroenterology

## 2022-12-05 DIAGNOSIS — Z1231 Encounter for screening mammogram for malignant neoplasm of breast: Secondary | ICD-10-CM | POA: Diagnosis not present

## 2022-12-07 DIAGNOSIS — M546 Pain in thoracic spine: Secondary | ICD-10-CM | POA: Diagnosis not present

## 2022-12-07 DIAGNOSIS — M9901 Segmental and somatic dysfunction of cervical region: Secondary | ICD-10-CM | POA: Diagnosis not present

## 2022-12-07 DIAGNOSIS — M9905 Segmental and somatic dysfunction of pelvic region: Secondary | ICD-10-CM | POA: Diagnosis not present

## 2022-12-07 DIAGNOSIS — M9902 Segmental and somatic dysfunction of thoracic region: Secondary | ICD-10-CM | POA: Diagnosis not present

## 2022-12-07 DIAGNOSIS — M542 Cervicalgia: Secondary | ICD-10-CM | POA: Diagnosis not present

## 2022-12-07 DIAGNOSIS — M9903 Segmental and somatic dysfunction of lumbar region: Secondary | ICD-10-CM | POA: Diagnosis not present

## 2022-12-14 ENCOUNTER — Ambulatory Visit (AMBULATORY_SURGERY_CENTER): Payer: PPO | Admitting: Gastroenterology

## 2022-12-14 ENCOUNTER — Encounter: Payer: Self-pay | Admitting: Gastroenterology

## 2022-12-14 VITALS — BP 128/93 | HR 61 | Temp 98.0°F | Resp 19 | Ht 66.5 in | Wt 236.0 lb

## 2022-12-14 DIAGNOSIS — K219 Gastro-esophageal reflux disease without esophagitis: Secondary | ICD-10-CM

## 2022-12-14 DIAGNOSIS — Z86010 Personal history of colonic polyps: Secondary | ICD-10-CM | POA: Diagnosis not present

## 2022-12-14 DIAGNOSIS — K2970 Gastritis, unspecified, without bleeding: Secondary | ICD-10-CM | POA: Diagnosis not present

## 2022-12-14 DIAGNOSIS — D123 Benign neoplasm of transverse colon: Secondary | ICD-10-CM | POA: Diagnosis not present

## 2022-12-14 DIAGNOSIS — K635 Polyp of colon: Secondary | ICD-10-CM | POA: Diagnosis not present

## 2022-12-14 DIAGNOSIS — Z09 Encounter for follow-up examination after completed treatment for conditions other than malignant neoplasm: Secondary | ICD-10-CM

## 2022-12-14 DIAGNOSIS — I10 Essential (primary) hypertension: Secondary | ICD-10-CM | POA: Diagnosis not present

## 2022-12-14 DIAGNOSIS — K2951 Unspecified chronic gastritis with bleeding: Secondary | ICD-10-CM | POA: Diagnosis not present

## 2022-12-14 MED ORDER — SODIUM CHLORIDE 0.9 % IV SOLN: 500.0000 mL | Freq: Once | INTRAVENOUS | Status: DC

## 2022-12-14 NOTE — Op Note (Signed)
[facility] Patient Name: Deanna [name] Procedure Date: 12/14/2022 3:06 PM MRN: [id number] Endoscopist: Corliss Parish , MD, [id number] Age: 72 Referring MD:  Date of Birth: 04/30/1951 Gender: Female Account #: [id number] Procedure:                Colonoscopy Indications:              Surveillance: Personal history of adenomatous                            polyps on last colonoscopy 5 years ago Medicines:                Monitored Anesthesia Care Procedure:                Pre-Anesthesia Assessment:                           - Prior to the procedure, a History and Physical                            was performed, and patient medications and                            allergies were reviewed. The patient's tolerance of                            previous anesthesia was also reviewed. The risks                            and benefits of the procedure and the sedation                            options and risks were discussed with the patient.                            All questions were answered, and informed consent                            was obtained. Prior Anticoagulants: The patient has                            taken no anticoagulant or antiplatelet agents. ASA                            Grade Assessment: II - A patient with mild systemic                            disease. After reviewing the risks and benefits,                            the patient was deemed in satisfactory condition to                            undergo the procedure.  After obtaining informed consent, the colonoscope                            was passed under direct vision. Throughout the                            procedure, the patient's blood pressure, pulse, and                            oxygen saturations were monitored continuously. The                            Olympus [id number] ([id number]) Colonoscope was                            introduced through the  anus and advanced to the 3                            cm into the ileum. The colonoscopy was performed                            without difficulty. The patient tolerated the                            procedure. The quality of the bowel preparation was                            adequate. The terminal ileum, ileocecal valve,                            appendiceal orifice, and rectum were photographed. Scope In: 3:27:06 PM Scope Out: 3:40:13 PM Scope Withdrawal Time: 0 hours 10 minutes 19 seconds  Total Procedure Duration: 0 hours 13 minutes 7 seconds  Findings:                 The digital rectal exam findings include                            hemorrhoids. Pertinent negatives include no                            palpable rectal lesions.                           The terminal ileum and ileocecal valve appeared                            normal.                           Three sessile polyps were found in the transverse                            colon (1) and hepatic flexure (2). The polyps were  2 to 5 mm in size. These polyps were removed with a                            cold snare. Resection and retrieval were complete.                           Multiple small-mouthed diverticula were found in                            the recto-sigmoid colon and sigmoid colon.                           Normal mucosa was found in the entire colon                            otherwise.                           Non-bleeding non-thrombosed external and internal                            hemorrhoids were found during retroflexion, during                            perianal exam and during digital exam. The                            hemorrhoids were Grade II (internal hemorrhoids                            that prolapse but reduce spontaneously). Complications:            No immediate complications. Estimated Blood Loss:     Estimated blood loss was minimal. Impression:                - Hemorrhoids found on digital rectal exam.                           - The examined portion of the ileum was normal.                           - Three 2 to 5 mm polyps in the transverse colon                            and at the hepatic flexure, removed with a cold                            snare. Resected and retrieved.                           - Diverticulosis in the recto-sigmoid colon and in                            the sigmoid colon.                           -  Normal mucosa in the entire examined colon                            otherwise.                           - Non-bleeding non-thrombosed external and internal                            hemorrhoids. Recommendation:           - The patient will be observed post-procedure,                            until all discharge criteria are met.                           - Discharge patient to home.                           - Patient has a contact number available for                            emergencies. The signs and symptoms of potential                            delayed complications were discussed with the                            patient. Return to normal activities tomorrow.                            Written discharge instructions were provided to the                            patient.                           - High fiber diet.                           - Use FiberCon 1-2 tablets PO daily.                           - Continue present medications.                           - Await pathology results.                           - Repeat colonoscopy in 3/5/7 years for                            surveillance based on pathology results.                           - The findings and recommendations were discussed  with the patient.                           - The findings and recommendations were discussed                            with the patient's family. Corliss Parish,  MD 12/14/2022 3:48:34 PM

## 2022-12-14 NOTE — Progress Notes (Unsigned)
GASTROENTEROLOGY PROCEDURE H&P NOTE   Primary Care Physician: Darrow Bussing, MD  HPI: Deanna [name] is a 72 y.o. female who presents for EGD/Colonoscopy for evaluation of prior history of Adenomas, GERD.  Past Medical History:  Diagnosis Date   Chronic kidney disease    pt. reports that during episode of pancreatitis, she experienced decreased kidney function related to contrast dye    Colon polyps    GERD (gastroesophageal reflux disease)    History of hiatal hernia    HTN (hypertension)    Hx of pancreatitis 05/02/2013   attributes to Etodolac and Zpack   Kidney disease, chronic, stage III (GFR 30-59 ml/min) (HCC) 05/19/2015   Obesity    Pancreatitis    Primary localized osteoarthritis of left knee    Primary localized osteoarthritis of right knee    Reflux    Past Surgical History:  Procedure Laterality Date   COLONOSCOPY     COLONOSCOPY N/A 08/23/2017   Procedure: COLONOSCOPY;  Surgeon: Malissa Hippo, MD;  Location: [facility];  Service: Endoscopy;  Laterality: N/A;  200   DIAGNOSTIC LAPAROSCOPY     EUS N/A 08/15/2013   Procedure: UPPER ENDOSCOPIC ULTRASOUND (EUS) LINEAR;  Surgeon: Rachael Fee, MD;  Location: [facility];  Service: Endoscopy;  Laterality: N/A;   FOOT SURGERY     FOOT SURGERY Bilateral    bunions   JOINT REPLACEMENT Bilateral    knees   KNEE ARTHROSCOPY W/ MENISCAL REPAIR Left    LAPAROSCOPIC ENDOMETRIOSIS FULGURATION     pancreatitis     caused by Etodalac   POLYPECTOMY  08/23/2017   Procedure: POLYPECTOMY;  Surgeon: Malissa Hippo, MD;  Location: [facility];  Service: Endoscopy;;  cecal (HSx1, CSx1), ascending colon (CS x1), splenic flexure (CS x1)   TOTAL KNEE ARTHROPLASTY Right 05/18/2015   Procedure: TOTAL KNEE ARTHROPLASTY; RIGHT KNEE;  Surgeon: Salvatore Marvel, MD;  Location: [facility] OR;  Service: Orthopedics;  Laterality: Right;   TOTAL KNEE ARTHROPLASTY Left 11/16/2015   TOTAL KNEE ARTHROPLASTY Left 11/16/2015   Procedure: LEFT  TOTAL KNEE ARTHROPLASTY;  Surgeon: Salvatore Marvel, MD;  Location: [facility] OR;  Service: Orthopedics;  Laterality: Left;   trigger thumb     rt thumb last year   Current Outpatient Medications  Medication Sig Dispense Refill   amLODipine (NORVASC) 10 MG tablet Take 10 mg by mouth daily.     Ascorbic Acid (VITAMIN C) 1000 MG tablet Take 1,000 mg by mouth daily.     atorvastatin (LIPITOR) 20 MG tablet Take 20 mg by mouth daily after breakfast.      Coenzyme Q10 (COQ10) 200 MG CAPS Take 200 mg by mouth daily.     esomeprazole (NEXIUM) 40 MG capsule Take 1 capsule (40 mg total) by mouth daily. 30 capsule 12   famotidine (PEPCID) 20 MG tablet Take 20 mg by mouth 3 (three) times daily.     Krill Oil 500 MG CAPS Take 500 mg by mouth daily.     levothyroxine (SYNTHROID, LEVOTHROID) 50 MCG tablet Take 50 mcg by mouth daily before breakfast.     magnesium oxide (MAG-OX) 400 MG tablet Take 400 mg by mouth daily.     Multiple Vitamins-Minerals (CENTRUM SILVER 50+WOMEN PO) Take by mouth daily.     Na Sulfate-K Sulfate-Mg Sulf 17.5-3.13-1.6 GM/177ML SOLN Use as directed; may use generic; goodrx card if insurance will not cover generic 354 mL 0   vitamin B-12 (CYANOCOBALAMIN) 1000 MCG tablet Take 1,000 mcg  by mouth daily.     zinc gluconate 50 MG tablet Take 50 mg by mouth daily.     No current facility-administered medications for this visit.    Current Outpatient Medications:    amLODipine (NORVASC) 10 MG tablet, Take 10 mg by mouth daily., Disp: , Rfl:    Ascorbic Acid (VITAMIN C) 1000 MG tablet, Take 1,000 mg by mouth daily., Disp: , Rfl:    atorvastatin (LIPITOR) 20 MG tablet, Take 20 mg by mouth daily after breakfast. , Disp: , Rfl:    Coenzyme Q10 (COQ10) 200 MG CAPS, Take 200 mg by mouth daily., Disp: , Rfl:    esomeprazole (NEXIUM) 40 MG capsule, Take 1 capsule (40 mg total) by mouth daily., Disp: 30 capsule, Rfl: 12   famotidine (PEPCID) 20 MG tablet, Take 20 mg by mouth 3 (three) times daily.,  Disp: , Rfl:    Krill Oil 500 MG CAPS, Take 500 mg by mouth daily., Disp: , Rfl:    levothyroxine (SYNTHROID, LEVOTHROID) 50 MCG tablet, Take 50 mcg by mouth daily before breakfast., Disp: , Rfl:    magnesium oxide (MAG-OX) 400 MG tablet, Take 400 mg by mouth daily., Disp: , Rfl:    Multiple Vitamins-Minerals (CENTRUM SILVER 50+WOMEN PO), Take by mouth daily., Disp: , Rfl:    Na Sulfate-K Sulfate-Mg Sulf 17.5-3.13-1.6 GM/177ML SOLN, Use as directed; may use generic; goodrx card if insurance will not cover generic, Disp: 354 mL, Rfl: 0   vitamin B-12 (CYANOCOBALAMIN) 1000 MCG tablet, Take 1,000 mcg by mouth daily., Disp: , Rfl:    zinc gluconate 50 MG tablet, Take 50 mg by mouth daily., Disp: , Rfl:  Allergies  Allergen Reactions   Contrast Media [Iodinated Contrast Media] Other (See Comments)    Renal function decreased    Etodolac Other (See Comments)    Pancreatitis    Z-Pak [Azithromycin] Other (See Comments)    Pancreatitis    Dilaudid [Hydromorphone Hcl] Other (See Comments)     hallucinations   Hydrocodone Nausea And Vomiting   Oxycodone Other (See Comments)    Severe constipation   Sulfa Antibiotics Itching and Rash   Sulfasalazine Itching and Rash   Family History  Problem Relation Age of Onset   Aneurysm Mother    Diabetes Father    Hypertension Father    Cancer - Other Sister    Colon cancer Neg Hx    Stomach cancer Neg Hx    Esophageal cancer Neg Hx    Inflammatory bowel disease Neg Hx    Liver disease Neg Hx    Pancreatic cancer Neg Hx    Rectal cancer Neg Hx    Social History   Socioeconomic History   Marital status: Married    Spouse name: Not on file   Number of children: 1   Years of education: 16   Highest education level: Not on file  Occupational History   Occupation: retired   Occupation: retired  Tobacco Use   Smoking status: Never   Smokeless tobacco: Never  Vaping Use   Vaping status: Never Used  Substance and Sexual Activity   Alcohol  use: No   Drug use: No   Sexual activity: Not Currently  Other Topics Concern   Not on file  Social History Narrative   Not on file   Social Determinants of Health   Financial Resource Strain: Not on file  Food Insecurity: Not on file  Transportation Needs: Not on file  Physical Activity: Not on  file  Stress: Not on file  Social Connections: Not on file  Intimate Partner Violence: Not on file    Physical Exam: There were no vitals filed for this visit. There is no height or weight on file to calculate BMI. GEN: NAD EYE: Sclerae anicteric ENT: MMM CV: Non-tachycardic GI: Soft, NT/ND NEURO:  Alert & Oriented x 3  Lab Results: No results for input(s): "WBC", "HGB", "HCT", "PLT" in the last 72 hours. BMET No results for input(s): "NA", "K", "CL", "CO2", "GLUCOSE", "BUN", "CREATININE", "CALCIUM" in the last 72 hours. LFT No results for input(s): "PROT", "ALBUMIN", "AST", "ALT", "ALKPHOS", "BILITOT", "BILIDIR", "IBILI" in the last 72 hours. PT/INR No results for input(s): "LABPROT", "INR" in the last 72 hours.   Impression / Plan: This is a 72 y.o.female who presents for EGD/Colonoscopy for evaluation of prior history of Adenomas, GERD.  The risks and benefits of endoscopic evaluation/treatment were discussed with the patient and/or family; these include but are not limited to the risk of perforation, infection, bleeding, missed lesions, lack of diagnosis, severe illness requiring hospitalization, as well as anesthesia and sedation related illnesses.  The patient's history has been reviewed, patient examined, no change in status, and deemed stable for procedure.  The patient and/or family is agreeable to proceed.    Corliss Parish, MD [facility] Advanced Endoscopy Office # [phone]

## 2022-12-14 NOTE — Progress Notes (Signed)
I have reviewed the patient's medical history in detail and updated the computerized patient record.

## 2022-12-14 NOTE — Patient Instructions (Addendum)
High fiber diet.  Please read over handout Use FiberCon 1-2 tablets PO daily. Continue present medications. Await pathology results. Repeat colonoscopy in 3/5/7 years for surveillance based on pathology results.  Please read over handouts about polyps, hemorrhoids and diverticulosis and gastritis                            YOU HAD AN ENDOSCOPIC PROCEDURE TODAY AT THE [facility]:   Refer to the procedure report that was given to you for any specific questions about what was found during the examination.  If the procedure report does not answer your questions, please call your gastroenterologist to clarify.  If you requested that your care partner not be given the details of your procedure findings, then the procedure report has been included in a sealed envelope for you to review at your convenience later.  YOU SHOULD EXPECT: Some feelings of bloating in the abdomen. Passage of more gas than usual.  Walking can help get rid of the air that was put into your GI tract during the procedure and reduce the bloating. If you had a lower endoscopy (such as a colonoscopy or flexible sigmoidoscopy) you may notice spotting of blood in your stool or on the toilet paper. If you underwent a bowel prep for your procedure, you may not have a normal bowel movement for a few days.  Please Note:  You might notice some irritation and congestion in your nose or some drainage.  This is from the oxygen used during your procedure.  There is no need for concern and it should clear up in a day or so.  SYMPTOMS TO REPORT IMMEDIATELY:  Following lower endoscopy (colonoscopy or flexible sigmoidoscopy):  Excessive amounts of blood in the stool  Significant tenderness or worsening of abdominal pains  Swelling of the abdomen that is new, acute  Fever of 100F or higher  Following upper endoscopy (EGD)  Vomiting of blood or coffee ground material  New chest pain or pain under the shoulder blades  Painful or  persistently difficult swallowing  New shortness of breath  Fever of 100F or higher  Black, tarry-looking stools  For urgent or emergent issues, a gastroenterologist can be reached at any hour by calling [phone]. Do not use MyChart messaging for urgent concerns.    DIET:  We do recommend a small meal at first, but then you may proceed to your regular diet.  Drink plenty of fluids but you should avoid alcoholic beverages for 24 hours.  ACTIVITY:  You should plan to take it easy for the rest of today and you should NOT DRIVE or use heavy machinery until tomorrow (because of the sedation medicines used during the test).    FOLLOW UP: Our staff will call the number listed on your records the next business day following your procedure.  We will call around 7:15- 8:00 am to check on you and address any questions or concerns that you may have regarding the information given to you following your procedure. If we do not reach you, we will leave a message.     If any biopsies were taken you will be contacted by phone or by letter within the next 1-3 weeks.  Please call us at [phone] if you have not heard about the biopsies in 3 weeks.    SIGNATURES/CONFIDENTIALITY: You and/or your care partner have signed paperwork which will be entered into your electronic medical record.  These signatures attest to the fact that that the information above on your After Visit Summary has been reviewed and is understood.  Full responsibility of the confidentiality of this discharge information lies with you and/or your care-partner.

## 2022-12-14 NOTE — Op Note (Signed)
[facility] Patient Name: Deanna [name] Procedure Date: 12/14/2022 3:11 PM MRN: [id number] Endoscopist: Corliss Parish , MD, [id number] Age: 72 Referring MD:  Date of Birth: [birth year] Gender: Female Account #: [id number] Procedure:                Upper GI endoscopy Indications:              Heartburn, Follow-up of gastro-esophageal reflux                            disease, Screening for Barrett's esophagus in                            patient at risk for this condition Medicines:                Monitored Anesthesia Care Procedure:                Pre-Anesthesia Assessment:                           - Prior to the procedure, a History and Physical                            was performed, and patient medications and                            allergies were reviewed. The patient's tolerance of                            previous anesthesia was also reviewed. The risks                            and benefits of the procedure and the sedation                            options and risks were discussed with the patient.                            All questions were answered, and informed consent                            was obtained. Prior Anticoagulants: The patient has                            taken no anticoagulant or antiplatelet agents. ASA                            Grade Assessment: II - A patient with mild systemic                            disease. After reviewing the risks and benefits,                            the patient was deemed in satisfactory condition to  undergo the procedure.                           After obtaining informed consent, the endoscope was                            passed under direct vision. Throughout the                            procedure, the patient's blood pressure, pulse, and                            oxygen saturations were monitored continuously. The                            Olympus Scope  [id number] was introduced through the                            mouth, and advanced to the second part of duodenum.                            The upper GI endoscopy was accomplished without                            difficulty. The patient tolerated the procedure. Scope In: Scope Out: Findings:                 No gross lesions were noted in the entire esophagus.                           The Z-line was irregular and was found 30 cm from                            the incisors.                           A 4 cm hiatal hernia was present.                           Patchy mildly erythematous mucosa without bleeding                            was found in the entire examined stomach. Biopsies                            were taken with a cold forceps for histology and                            Helicobacter pylori testing.                           No gross lesions were noted in the duodenal bulb,                            in the first  portion of the duodenum and in the                            second portion of the duodenum. Complications:            No immediate complications. Estimated Blood Loss:     Estimated blood loss was minimal. Impression:               - No gross lesions in the entire esophagus. Z-line                            irregular, 30 cm from the incisors.                           - 4 cm hiatal hernia.                           - Erythematous mucosa in the stomach. Biopsied.                           - No gross lesions in the duodenal bulb, in the                            first portion of the duodenum and in the second                            portion of the duodenum. Recommendation:           - Proceed to scheduled colonoscopy.                           - Continue present medications.                           - Await pathology results.                           - The findings and recommendations were discussed                            with the patient.                            - The findings and recommendations were discussed                            with the patient's family. Corliss Parish, MD 12/14/2022 3:45:15 PM

## 2022-12-14 NOTE — Progress Notes (Unsigned)
Sedate, gd SR, tolerated procedure well, VSS, report to RN 

## 2022-12-15 ENCOUNTER — Telehealth: Payer: Self-pay | Admitting: *Deleted

## 2022-12-15 NOTE — Telephone Encounter (Signed)
No answer for post procedure call back. Left VM. 

## 2022-12-20 ENCOUNTER — Encounter: Payer: Self-pay | Admitting: Podiatry

## 2022-12-20 ENCOUNTER — Encounter: Payer: Self-pay | Admitting: Gastroenterology

## 2022-12-20 ENCOUNTER — Ambulatory Visit: Payer: PPO | Admitting: Podiatry

## 2022-12-20 ENCOUNTER — Ambulatory Visit (INDEPENDENT_AMBULATORY_CARE_PROVIDER_SITE_OTHER): Payer: PPO

## 2022-12-20 DIAGNOSIS — M722 Plantar fascial fibromatosis: Secondary | ICD-10-CM

## 2022-12-20 MED ORDER — TRIAMCINOLONE ACETONIDE 40 MG/ML IJ SUSP
20.0000 mg | Freq: Once | INTRAMUSCULAR | Status: AC
Start: 2022-12-20 — End: 2022-12-20
  Administered 2022-12-20: 20 mg

## 2022-12-20 NOTE — Progress Notes (Signed)
She presents today for a follow-up of her Planter fasciitis.  She states that is still quite tender though the last time she was here it had been 100% improved.  Objective: Vital signs are stable alert and oriented x 3.  Pulses are palpable.  Neurologic sensorium is intact she has pain to palpation medial calcaneal tubercle of the right heel dorsal aspect of the left foot is good plantar aspect of the left foot is good.  Assessment: Plan fasciitis right.  Plan: Reinjected the area today 20 mg Kenalog 5 mg Marcaine if not improved follow-up with her in several weeks may need to consider MRI.

## 2023-01-04 DIAGNOSIS — H353221 Exudative age-related macular degeneration, left eye, with active choroidal neovascularization: Secondary | ICD-10-CM | POA: Diagnosis not present

## 2023-01-06 DIAGNOSIS — M9902 Segmental and somatic dysfunction of thoracic region: Secondary | ICD-10-CM | POA: Diagnosis not present

## 2023-01-06 DIAGNOSIS — M9901 Segmental and somatic dysfunction of cervical region: Secondary | ICD-10-CM | POA: Diagnosis not present

## 2023-01-06 DIAGNOSIS — M9903 Segmental and somatic dysfunction of lumbar region: Secondary | ICD-10-CM | POA: Diagnosis not present

## 2023-01-06 DIAGNOSIS — M546 Pain in thoracic spine: Secondary | ICD-10-CM | POA: Diagnosis not present

## 2023-01-06 DIAGNOSIS — M9905 Segmental and somatic dysfunction of pelvic region: Secondary | ICD-10-CM | POA: Diagnosis not present

## 2023-01-06 DIAGNOSIS — M542 Cervicalgia: Secondary | ICD-10-CM | POA: Diagnosis not present

## 2023-02-01 DIAGNOSIS — M546 Pain in thoracic spine: Secondary | ICD-10-CM | POA: Diagnosis not present

## 2023-02-01 DIAGNOSIS — M542 Cervicalgia: Secondary | ICD-10-CM | POA: Diagnosis not present

## 2023-02-01 DIAGNOSIS — M9901 Segmental and somatic dysfunction of cervical region: Secondary | ICD-10-CM | POA: Diagnosis not present

## 2023-02-01 DIAGNOSIS — M9903 Segmental and somatic dysfunction of lumbar region: Secondary | ICD-10-CM | POA: Diagnosis not present

## 2023-02-01 DIAGNOSIS — M9902 Segmental and somatic dysfunction of thoracic region: Secondary | ICD-10-CM | POA: Diagnosis not present

## 2023-02-01 DIAGNOSIS — M9905 Segmental and somatic dysfunction of pelvic region: Secondary | ICD-10-CM | POA: Diagnosis not present

## 2023-02-24 DIAGNOSIS — H353221 Exudative age-related macular degeneration, left eye, with active choroidal neovascularization: Secondary | ICD-10-CM | POA: Diagnosis not present

## 2023-03-02 ENCOUNTER — Ambulatory Visit: Payer: PPO | Admitting: Podiatry

## 2023-03-08 DIAGNOSIS — M9903 Segmental and somatic dysfunction of lumbar region: Secondary | ICD-10-CM | POA: Diagnosis not present

## 2023-03-08 DIAGNOSIS — M9905 Segmental and somatic dysfunction of pelvic region: Secondary | ICD-10-CM | POA: Diagnosis not present

## 2023-03-08 DIAGNOSIS — M9902 Segmental and somatic dysfunction of thoracic region: Secondary | ICD-10-CM | POA: Diagnosis not present

## 2023-03-08 DIAGNOSIS — M9901 Segmental and somatic dysfunction of cervical region: Secondary | ICD-10-CM | POA: Diagnosis not present

## 2023-03-08 DIAGNOSIS — M542 Cervicalgia: Secondary | ICD-10-CM | POA: Diagnosis not present

## 2023-03-08 DIAGNOSIS — M546 Pain in thoracic spine: Secondary | ICD-10-CM | POA: Diagnosis not present

## 2023-04-05 DIAGNOSIS — M9903 Segmental and somatic dysfunction of lumbar region: Secondary | ICD-10-CM | POA: Diagnosis not present

## 2023-04-05 DIAGNOSIS — M546 Pain in thoracic spine: Secondary | ICD-10-CM | POA: Diagnosis not present

## 2023-04-05 DIAGNOSIS — M9902 Segmental and somatic dysfunction of thoracic region: Secondary | ICD-10-CM | POA: Diagnosis not present

## 2023-04-05 DIAGNOSIS — M9905 Segmental and somatic dysfunction of pelvic region: Secondary | ICD-10-CM | POA: Diagnosis not present

## 2023-04-05 DIAGNOSIS — M542 Cervicalgia: Secondary | ICD-10-CM | POA: Diagnosis not present

## 2023-04-05 DIAGNOSIS — M9901 Segmental and somatic dysfunction of cervical region: Secondary | ICD-10-CM | POA: Diagnosis not present

## 2023-04-12 DIAGNOSIS — H353221 Exudative age-related macular degeneration, left eye, with active choroidal neovascularization: Secondary | ICD-10-CM | POA: Diagnosis not present

## 2023-04-14 DIAGNOSIS — Z79899 Other long term (current) drug therapy: Secondary | ICD-10-CM | POA: Diagnosis not present

## 2023-04-14 DIAGNOSIS — Z0001 Encounter for general adult medical examination with abnormal findings: Secondary | ICD-10-CM | POA: Diagnosis not present

## 2023-04-14 DIAGNOSIS — Z1331 Encounter for screening for depression: Secondary | ICD-10-CM | POA: Diagnosis not present

## 2023-04-14 DIAGNOSIS — Z6838 Body mass index (BMI) 38.0-38.9, adult: Secondary | ICD-10-CM | POA: Diagnosis not present

## 2023-04-14 DIAGNOSIS — H353221 Exudative age-related macular degeneration, left eye, with active choroidal neovascularization: Secondary | ICD-10-CM | POA: Diagnosis not present

## 2023-04-14 DIAGNOSIS — H353110 Nonexudative age-related macular degeneration, right eye, stage unspecified: Secondary | ICD-10-CM | POA: Diagnosis not present

## 2023-04-14 DIAGNOSIS — I10 Essential (primary) hypertension: Secondary | ICD-10-CM | POA: Diagnosis not present

## 2023-04-14 DIAGNOSIS — E039 Hypothyroidism, unspecified: Secondary | ICD-10-CM | POA: Diagnosis not present

## 2023-04-14 DIAGNOSIS — E7800 Pure hypercholesterolemia, unspecified: Secondary | ICD-10-CM | POA: Diagnosis not present

## 2023-04-14 DIAGNOSIS — N1831 Chronic kidney disease, stage 3a: Secondary | ICD-10-CM | POA: Diagnosis not present

## 2023-04-14 DIAGNOSIS — R7301 Impaired fasting glucose: Secondary | ICD-10-CM | POA: Diagnosis not present

## 2023-07-05 DIAGNOSIS — H353221 Exudative age-related macular degeneration, left eye, with active choroidal neovascularization: Secondary | ICD-10-CM | POA: Diagnosis not present

## 2023-07-12 DIAGNOSIS — M9902 Segmental and somatic dysfunction of thoracic region: Secondary | ICD-10-CM | POA: Diagnosis not present

## 2023-07-12 DIAGNOSIS — M9903 Segmental and somatic dysfunction of lumbar region: Secondary | ICD-10-CM | POA: Diagnosis not present

## 2023-07-12 DIAGNOSIS — M9901 Segmental and somatic dysfunction of cervical region: Secondary | ICD-10-CM | POA: Diagnosis not present

## 2023-07-12 DIAGNOSIS — M542 Cervicalgia: Secondary | ICD-10-CM | POA: Diagnosis not present

## 2023-07-12 DIAGNOSIS — M9905 Segmental and somatic dysfunction of pelvic region: Secondary | ICD-10-CM | POA: Diagnosis not present

## 2023-07-12 DIAGNOSIS — M546 Pain in thoracic spine: Secondary | ICD-10-CM | POA: Diagnosis not present

## 2023-08-02 DIAGNOSIS — H353221 Exudative age-related macular degeneration, left eye, with active choroidal neovascularization: Secondary | ICD-10-CM | POA: Diagnosis not present

## 2023-08-09 DIAGNOSIS — M9905 Segmental and somatic dysfunction of pelvic region: Secondary | ICD-10-CM | POA: Diagnosis not present

## 2023-08-09 DIAGNOSIS — M9903 Segmental and somatic dysfunction of lumbar region: Secondary | ICD-10-CM | POA: Diagnosis not present

## 2023-08-09 DIAGNOSIS — M542 Cervicalgia: Secondary | ICD-10-CM | POA: Diagnosis not present

## 2023-08-09 DIAGNOSIS — M9902 Segmental and somatic dysfunction of thoracic region: Secondary | ICD-10-CM | POA: Diagnosis not present

## 2023-08-09 DIAGNOSIS — M9901 Segmental and somatic dysfunction of cervical region: Secondary | ICD-10-CM | POA: Diagnosis not present

## 2023-08-09 DIAGNOSIS — M546 Pain in thoracic spine: Secondary | ICD-10-CM | POA: Diagnosis not present

## 2023-09-06 DIAGNOSIS — M9902 Segmental and somatic dysfunction of thoracic region: Secondary | ICD-10-CM | POA: Diagnosis not present

## 2023-09-06 DIAGNOSIS — M542 Cervicalgia: Secondary | ICD-10-CM | POA: Diagnosis not present

## 2023-09-06 DIAGNOSIS — M9903 Segmental and somatic dysfunction of lumbar region: Secondary | ICD-10-CM | POA: Diagnosis not present

## 2023-09-06 DIAGNOSIS — M9905 Segmental and somatic dysfunction of pelvic region: Secondary | ICD-10-CM | POA: Diagnosis not present

## 2023-09-06 DIAGNOSIS — M546 Pain in thoracic spine: Secondary | ICD-10-CM | POA: Diagnosis not present

## 2023-09-06 DIAGNOSIS — M9901 Segmental and somatic dysfunction of cervical region: Secondary | ICD-10-CM | POA: Diagnosis not present

## 2023-09-15 DIAGNOSIS — H353221 Exudative age-related macular degeneration, left eye, with active choroidal neovascularization: Secondary | ICD-10-CM | POA: Diagnosis not present

## 2023-10-04 DIAGNOSIS — M542 Cervicalgia: Secondary | ICD-10-CM | POA: Diagnosis not present

## 2023-10-04 DIAGNOSIS — M9905 Segmental and somatic dysfunction of pelvic region: Secondary | ICD-10-CM | POA: Diagnosis not present

## 2023-10-04 DIAGNOSIS — M9903 Segmental and somatic dysfunction of lumbar region: Secondary | ICD-10-CM | POA: Diagnosis not present

## 2023-10-04 DIAGNOSIS — M546 Pain in thoracic spine: Secondary | ICD-10-CM | POA: Diagnosis not present

## 2023-10-04 DIAGNOSIS — M9902 Segmental and somatic dysfunction of thoracic region: Secondary | ICD-10-CM | POA: Diagnosis not present

## 2023-10-04 DIAGNOSIS — M9901 Segmental and somatic dysfunction of cervical region: Secondary | ICD-10-CM | POA: Diagnosis not present

## 2023-10-12 DIAGNOSIS — L821 Other seborrheic keratosis: Secondary | ICD-10-CM | POA: Diagnosis not present

## 2023-10-12 DIAGNOSIS — Z1283 Encounter for screening for malignant neoplasm of skin: Secondary | ICD-10-CM | POA: Diagnosis not present

## 2023-10-12 DIAGNOSIS — D225 Melanocytic nevi of trunk: Secondary | ICD-10-CM | POA: Diagnosis not present

## 2023-10-13 DIAGNOSIS — Z6838 Body mass index (BMI) 38.0-38.9, adult: Secondary | ICD-10-CM | POA: Diagnosis not present

## 2023-10-13 DIAGNOSIS — R7301 Impaired fasting glucose: Secondary | ICD-10-CM | POA: Diagnosis not present

## 2023-10-13 DIAGNOSIS — K219 Gastro-esophageal reflux disease without esophagitis: Secondary | ICD-10-CM | POA: Diagnosis not present

## 2023-10-13 DIAGNOSIS — Z79899 Other long term (current) drug therapy: Secondary | ICD-10-CM | POA: Diagnosis not present

## 2023-10-13 DIAGNOSIS — H353110 Nonexudative age-related macular degeneration, right eye, stage unspecified: Secondary | ICD-10-CM | POA: Diagnosis not present

## 2023-10-13 DIAGNOSIS — N1831 Chronic kidney disease, stage 3a: Secondary | ICD-10-CM | POA: Diagnosis not present

## 2023-10-13 DIAGNOSIS — H353221 Exudative age-related macular degeneration, left eye, with active choroidal neovascularization: Secondary | ICD-10-CM | POA: Diagnosis not present

## 2023-10-13 DIAGNOSIS — E7800 Pure hypercholesterolemia, unspecified: Secondary | ICD-10-CM | POA: Diagnosis not present

## 2023-10-13 DIAGNOSIS — I10 Essential (primary) hypertension: Secondary | ICD-10-CM | POA: Diagnosis not present

## 2023-10-13 DIAGNOSIS — E039 Hypothyroidism, unspecified: Secondary | ICD-10-CM | POA: Diagnosis not present

## 2023-10-24 ENCOUNTER — Other Ambulatory Visit (HOSPITAL_COMMUNITY): Payer: Self-pay | Admitting: Family Medicine

## 2023-10-24 DIAGNOSIS — Z1231 Encounter for screening mammogram for malignant neoplasm of breast: Secondary | ICD-10-CM

## 2023-10-27 DIAGNOSIS — H353221 Exudative age-related macular degeneration, left eye, with active choroidal neovascularization: Secondary | ICD-10-CM | POA: Diagnosis not present

## 2023-11-01 DIAGNOSIS — M9902 Segmental and somatic dysfunction of thoracic region: Secondary | ICD-10-CM | POA: Diagnosis not present

## 2023-11-01 DIAGNOSIS — M542 Cervicalgia: Secondary | ICD-10-CM | POA: Diagnosis not present

## 2023-11-01 DIAGNOSIS — M9905 Segmental and somatic dysfunction of pelvic region: Secondary | ICD-10-CM | POA: Diagnosis not present

## 2023-11-01 DIAGNOSIS — M9901 Segmental and somatic dysfunction of cervical region: Secondary | ICD-10-CM | POA: Diagnosis not present

## 2023-11-01 DIAGNOSIS — M9903 Segmental and somatic dysfunction of lumbar region: Secondary | ICD-10-CM | POA: Diagnosis not present

## 2023-11-01 DIAGNOSIS — M546 Pain in thoracic spine: Secondary | ICD-10-CM | POA: Diagnosis not present

## 2023-12-06 DIAGNOSIS — M9905 Segmental and somatic dysfunction of pelvic region: Secondary | ICD-10-CM | POA: Diagnosis not present

## 2023-12-06 DIAGNOSIS — M546 Pain in thoracic spine: Secondary | ICD-10-CM | POA: Diagnosis not present

## 2023-12-06 DIAGNOSIS — M9901 Segmental and somatic dysfunction of cervical region: Secondary | ICD-10-CM | POA: Diagnosis not present

## 2023-12-06 DIAGNOSIS — M9902 Segmental and somatic dysfunction of thoracic region: Secondary | ICD-10-CM | POA: Diagnosis not present

## 2023-12-06 DIAGNOSIS — M9903 Segmental and somatic dysfunction of lumbar region: Secondary | ICD-10-CM | POA: Diagnosis not present

## 2023-12-06 DIAGNOSIS — M542 Cervicalgia: Secondary | ICD-10-CM | POA: Diagnosis not present

## 2023-12-08 ENCOUNTER — Encounter (HOSPITAL_COMMUNITY): Payer: Self-pay

## 2023-12-08 ENCOUNTER — Ambulatory Visit (HOSPITAL_COMMUNITY)
Admission: RE | Admit: 2023-12-08 | Discharge: 2023-12-08 | Disposition: A | Discharge status: Home / Self Care | Source: Ambulatory Visit | Attending: Family Medicine | Admitting: Family Medicine

## 2023-12-08 DIAGNOSIS — Z1231 Encounter for screening mammogram for malignant neoplasm of breast: Secondary | ICD-10-CM | POA: Diagnosis not present

## 2023-12-15 DIAGNOSIS — H353221 Exudative age-related macular degeneration, left eye, with active choroidal neovascularization: Secondary | ICD-10-CM | POA: Diagnosis not present

## 2024-01-03 DIAGNOSIS — M546 Pain in thoracic spine: Secondary | ICD-10-CM | POA: Diagnosis not present

## 2024-01-03 DIAGNOSIS — M9903 Segmental and somatic dysfunction of lumbar region: Secondary | ICD-10-CM | POA: Diagnosis not present

## 2024-01-03 DIAGNOSIS — M542 Cervicalgia: Secondary | ICD-10-CM | POA: Diagnosis not present

## 2024-01-03 DIAGNOSIS — M9902 Segmental and somatic dysfunction of thoracic region: Secondary | ICD-10-CM | POA: Diagnosis not present

## 2024-01-03 DIAGNOSIS — M9905 Segmental and somatic dysfunction of pelvic region: Secondary | ICD-10-CM | POA: Diagnosis not present

## 2024-01-03 DIAGNOSIS — M9901 Segmental and somatic dysfunction of cervical region: Secondary | ICD-10-CM | POA: Diagnosis not present

## 2024-01-31 DIAGNOSIS — H353221 Exudative age-related macular degeneration, left eye, with active choroidal neovascularization: Secondary | ICD-10-CM | POA: Diagnosis not present

## 2024-02-07 DIAGNOSIS — M9905 Segmental and somatic dysfunction of pelvic region: Secondary | ICD-10-CM | POA: Diagnosis not present

## 2024-02-07 DIAGNOSIS — M542 Cervicalgia: Secondary | ICD-10-CM | POA: Diagnosis not present

## 2024-02-07 DIAGNOSIS — M9901 Segmental and somatic dysfunction of cervical region: Secondary | ICD-10-CM | POA: Diagnosis not present

## 2024-02-07 DIAGNOSIS — M546 Pain in thoracic spine: Secondary | ICD-10-CM | POA: Diagnosis not present

## 2024-02-07 DIAGNOSIS — M9903 Segmental and somatic dysfunction of lumbar region: Secondary | ICD-10-CM | POA: Diagnosis not present

## 2024-02-07 DIAGNOSIS — M9902 Segmental and somatic dysfunction of thoracic region: Secondary | ICD-10-CM | POA: Diagnosis not present

## 2024-03-13 DIAGNOSIS — H353221 Exudative age-related macular degeneration, left eye, with active choroidal neovascularization: Secondary | ICD-10-CM | POA: Diagnosis not present

## 2024-04-10 DIAGNOSIS — M9903 Segmental and somatic dysfunction of lumbar region: Secondary | ICD-10-CM | POA: Diagnosis not present

## 2024-04-10 DIAGNOSIS — M62830 Muscle spasm of back: Secondary | ICD-10-CM | POA: Diagnosis not present

## 2024-04-10 DIAGNOSIS — M9902 Segmental and somatic dysfunction of thoracic region: Secondary | ICD-10-CM | POA: Diagnosis not present

## 2024-04-10 DIAGNOSIS — M542 Cervicalgia: Secondary | ICD-10-CM | POA: Diagnosis not present

## 2024-04-10 DIAGNOSIS — M9901 Segmental and somatic dysfunction of cervical region: Secondary | ICD-10-CM | POA: Diagnosis not present

## 2024-04-10 DIAGNOSIS — M546 Pain in thoracic spine: Secondary | ICD-10-CM | POA: Diagnosis not present

## 2024-04-10 DIAGNOSIS — M9905 Segmental and somatic dysfunction of pelvic region: Secondary | ICD-10-CM | POA: Diagnosis not present
